# Patient Record
Sex: Female | Born: 1935 | Race: White | Hispanic: No | Marital: Married | Smoking: Never smoker
Health system: Southern US, Community
[De-identification: ages and names within clinical notes are randomized; demographics above are authoritative.]

## PROBLEM LIST (undated history)

## (undated) DIAGNOSIS — K644 Residual hemorrhoidal skin tags: Secondary | ICD-10-CM

## (undated) DIAGNOSIS — M810 Age-related osteoporosis without current pathological fracture: Secondary | ICD-10-CM

## (undated) DIAGNOSIS — K5900 Constipation, unspecified: Secondary | ICD-10-CM

## (undated) DIAGNOSIS — H9190 Unspecified hearing loss, unspecified ear: Secondary | ICD-10-CM

## (undated) DIAGNOSIS — R030 Elevated blood-pressure reading, without diagnosis of hypertension: Secondary | ICD-10-CM

## (undated) DIAGNOSIS — K219 Gastro-esophageal reflux disease without esophagitis: Secondary | ICD-10-CM

## (undated) DIAGNOSIS — R609 Edema, unspecified: Secondary | ICD-10-CM

## (undated) DIAGNOSIS — R42 Dizziness and giddiness: Secondary | ICD-10-CM

## (undated) DIAGNOSIS — R413 Other amnesia: Secondary | ICD-10-CM

## (undated) DIAGNOSIS — E782 Mixed hyperlipidemia: Secondary | ICD-10-CM

## (undated) DIAGNOSIS — I83893 Varicose veins of bilateral lower extremities with other complications: Secondary | ICD-10-CM

## (undated) DIAGNOSIS — J309 Allergic rhinitis, unspecified: Secondary | ICD-10-CM

## (undated) DIAGNOSIS — H269 Unspecified cataract: Secondary | ICD-10-CM

## (undated) DIAGNOSIS — L509 Urticaria, unspecified: Secondary | ICD-10-CM

## (undated) DIAGNOSIS — Z860100 Personal history of colon polyps, unspecified: Secondary | ICD-10-CM

## (undated) DIAGNOSIS — L280 Lichen simplex chronicus: Secondary | ICD-10-CM

## (undated) DIAGNOSIS — S139XXA Sprain of joints and ligaments of unspecified parts of neck, initial encounter: Secondary | ICD-10-CM

## (undated) DIAGNOSIS — K5909 Other constipation: Secondary | ICD-10-CM

## (undated) DIAGNOSIS — I10 Essential (primary) hypertension: Secondary | ICD-10-CM

## (undated) DIAGNOSIS — C50919 Malignant neoplasm of unspecified site of unspecified female breast: Secondary | ICD-10-CM

## (undated) HISTORY — DX: Edema, unspecified: R60.9

## (undated) HISTORY — DX: Sprain of joints and ligaments of unspecified parts of neck, initial encounter: S13.9XXA

## (undated) HISTORY — DX: Other amnesia: R41.3

## (undated) HISTORY — DX: Mixed hyperlipidemia: E78.2

## (undated) HISTORY — DX: Other constipation: K59.09

## (undated) HISTORY — DX: Allergic rhinitis, unspecified: J30.9

## (undated) HISTORY — DX: Malignant neoplasm of unspecified site of unspecified female breast: C50.919

## (undated) HISTORY — PX: BILATERAL OOPHORECTOMY: SHX1221

## (undated) HISTORY — DX: Urticaria, unspecified: L50.9

## (undated) HISTORY — DX: Essential (primary) hypertension: I10

## (undated) HISTORY — DX: Unspecified cataract: H26.9

## (undated) HISTORY — DX: Gastro-esophageal reflux disease without esophagitis: K21.9

## (undated) HISTORY — DX: Lichen simplex chronicus: L28.0

## (undated) HISTORY — DX: Unspecified hearing loss, unspecified ear: H91.90

## (undated) HISTORY — DX: Varicose veins of bilateral lower extremities with other complications: I83.893

## (undated) HISTORY — DX: Elevated blood-pressure reading, without diagnosis of hypertension: R03.0

## (undated) HISTORY — DX: Personal history of colon polyps, unspecified: Z86.0100

## (undated) HISTORY — PX: OTHER SURGICAL HISTORY: SHX169

## (undated) HISTORY — DX: Dizziness and giddiness: R42

## (undated) HISTORY — DX: Residual hemorrhoidal skin tags: K64.4

## (undated) HISTORY — DX: Constipation, unspecified: K59.00

## (undated) HISTORY — DX: Age-related osteoporosis without current pathological fracture: M81.0

## (undated) HISTORY — DX: Personal history of colonic polyps: Z86.010

---

## 1985-11-07 HISTORY — PX: PARTIAL HYSTERECTOMY: SHX80

## 1998-04-28 ENCOUNTER — Other Ambulatory Visit: Admission: RE | Admit: 1998-04-28 | Discharge: 1998-04-28 | Payer: Self-pay | Admitting: Obstetrics and Gynecology

## 1998-07-29 ENCOUNTER — Ambulatory Visit (HOSPITAL_COMMUNITY): Admission: RE | Admit: 1998-07-29 | Discharge: 1998-07-29 | Payer: Self-pay | Admitting: Obstetrics and Gynecology

## 1999-08-04 ENCOUNTER — Encounter: Payer: Self-pay | Admitting: Family Medicine

## 1999-08-04 ENCOUNTER — Ambulatory Visit (HOSPITAL_COMMUNITY): Admission: RE | Admit: 1999-08-04 | Discharge: 1999-08-04 | Payer: Self-pay | Admitting: Obstetrics and Gynecology

## 1999-11-08 HISTORY — PX: OTHER SURGICAL HISTORY: SHX169

## 2000-08-04 ENCOUNTER — Ambulatory Visit (HOSPITAL_COMMUNITY): Admission: RE | Admit: 2000-08-04 | Discharge: 2000-08-04 | Payer: Self-pay | Admitting: Family Medicine

## 2000-08-04 ENCOUNTER — Encounter: Payer: Self-pay | Admitting: Obstetrics and Gynecology

## 2000-08-14 ENCOUNTER — Other Ambulatory Visit: Admission: RE | Admit: 2000-08-14 | Discharge: 2000-08-14 | Payer: Self-pay | Admitting: Obstetrics and Gynecology

## 2000-08-22 ENCOUNTER — Other Ambulatory Visit: Admission: RE | Admit: 2000-08-22 | Discharge: 2000-08-22 | Payer: Self-pay | Admitting: Obstetrics and Gynecology

## 2000-08-25 ENCOUNTER — Encounter: Payer: Self-pay | Admitting: Obstetrics and Gynecology

## 2000-08-25 ENCOUNTER — Encounter: Admission: RE | Admit: 2000-08-25 | Discharge: 2000-08-25 | Payer: Self-pay | Admitting: Obstetrics and Gynecology

## 2001-08-22 ENCOUNTER — Encounter: Payer: Self-pay | Admitting: Family Medicine

## 2001-08-22 ENCOUNTER — Encounter: Admission: RE | Admit: 2001-08-22 | Discharge: 2001-08-22 | Payer: Self-pay | Admitting: Family Medicine

## 2001-09-07 HISTORY — PX: COLONOSCOPY: SHX174

## 2002-08-23 ENCOUNTER — Other Ambulatory Visit: Admission: RE | Admit: 2002-08-23 | Discharge: 2002-08-23 | Payer: Self-pay | Admitting: Gynecology

## 2002-08-26 ENCOUNTER — Encounter: Payer: Self-pay | Admitting: Family Medicine

## 2002-08-26 ENCOUNTER — Encounter: Admission: RE | Admit: 2002-08-26 | Discharge: 2002-08-26 | Payer: Self-pay | Admitting: Family Medicine

## 2003-04-08 HISTORY — PX: CATARACT EXTRACTION: SUR2

## 2003-08-28 ENCOUNTER — Encounter: Admission: RE | Admit: 2003-08-28 | Discharge: 2003-08-28 | Payer: Self-pay | Admitting: Gynecology

## 2003-08-28 ENCOUNTER — Encounter: Payer: Self-pay | Admitting: Gynecology

## 2003-09-08 HISTORY — PX: OTHER SURGICAL HISTORY: SHX169

## 2003-09-22 ENCOUNTER — Encounter: Admission: RE | Admit: 2003-09-22 | Discharge: 2003-09-22 | Payer: Self-pay | Admitting: Gynecology

## 2003-09-24 ENCOUNTER — Inpatient Hospital Stay (HOSPITAL_COMMUNITY): Admission: RE | Admit: 2003-09-24 | Discharge: 2003-09-26 | Payer: Self-pay | Admitting: Gynecology

## 2003-09-24 ENCOUNTER — Encounter (INDEPENDENT_AMBULATORY_CARE_PROVIDER_SITE_OTHER): Payer: Self-pay | Admitting: Specialist

## 2004-08-25 ENCOUNTER — Other Ambulatory Visit: Admission: RE | Admit: 2004-08-25 | Discharge: 2004-08-25 | Payer: Self-pay | Admitting: Obstetrics and Gynecology

## 2004-09-07 HISTORY — PX: COLONOSCOPY: SHX174

## 2004-09-17 ENCOUNTER — Ambulatory Visit: Payer: Self-pay | Admitting: Internal Medicine

## 2004-10-04 ENCOUNTER — Ambulatory Visit: Payer: Self-pay | Admitting: Gastroenterology

## 2004-11-07 HISTORY — PX: OTHER SURGICAL HISTORY: SHX169

## 2005-01-24 ENCOUNTER — Ambulatory Visit: Payer: Self-pay | Admitting: Internal Medicine

## 2005-02-01 ENCOUNTER — Ambulatory Visit: Payer: Self-pay | Admitting: Family Medicine

## 2005-04-15 ENCOUNTER — Ambulatory Visit: Payer: Self-pay | Admitting: Internal Medicine

## 2005-05-23 ENCOUNTER — Ambulatory Visit: Payer: Self-pay | Admitting: Internal Medicine

## 2005-06-17 ENCOUNTER — Ambulatory Visit: Payer: Self-pay | Admitting: Family Medicine

## 2005-08-31 ENCOUNTER — Ambulatory Visit: Payer: Self-pay | Admitting: Family Medicine

## 2005-11-29 ENCOUNTER — Ambulatory Visit: Payer: Self-pay | Admitting: Family Medicine

## 2006-01-18 ENCOUNTER — Ambulatory Visit: Payer: Self-pay | Admitting: Family Medicine

## 2006-03-08 ENCOUNTER — Encounter: Payer: Self-pay | Admitting: Obstetrics and Gynecology

## 2006-08-28 ENCOUNTER — Ambulatory Visit: Payer: Self-pay | Admitting: Family Medicine

## 2006-10-02 ENCOUNTER — Ambulatory Visit: Payer: Self-pay | Admitting: Family Medicine

## 2006-10-07 HISTORY — PX: BREAST BIOPSY: SHX20

## 2006-11-07 HISTORY — PX: BREAST LUMPECTOMY: SHX2

## 2006-12-07 ENCOUNTER — Encounter: Admission: RE | Admit: 2006-12-07 | Discharge: 2006-12-07 | Payer: Self-pay | Admitting: General Surgery

## 2006-12-13 ENCOUNTER — Encounter: Admission: RE | Admit: 2006-12-13 | Discharge: 2006-12-13 | Payer: Self-pay | Admitting: General Surgery

## 2006-12-13 ENCOUNTER — Ambulatory Visit (HOSPITAL_BASED_OUTPATIENT_CLINIC_OR_DEPARTMENT_OTHER): Admission: RE | Admit: 2006-12-13 | Discharge: 2006-12-13 | Payer: Self-pay | Admitting: General Surgery

## 2006-12-13 ENCOUNTER — Encounter (INDEPENDENT_AMBULATORY_CARE_PROVIDER_SITE_OTHER): Payer: Self-pay | Admitting: Specialist

## 2007-03-19 ENCOUNTER — Telehealth (INDEPENDENT_AMBULATORY_CARE_PROVIDER_SITE_OTHER): Payer: Self-pay | Admitting: *Deleted

## 2007-04-27 ENCOUNTER — Ambulatory Visit: Payer: Self-pay | Admitting: Cardiology

## 2007-04-27 ENCOUNTER — Ambulatory Visit: Payer: Self-pay | Admitting: Internal Medicine

## 2007-04-27 ENCOUNTER — Observation Stay (HOSPITAL_COMMUNITY): Admission: EM | Admit: 2007-04-27 | Discharge: 2007-04-28 | Payer: Self-pay | Admitting: Emergency Medicine

## 2007-04-27 ENCOUNTER — Encounter: Payer: Self-pay | Admitting: Cardiology

## 2007-05-02 ENCOUNTER — Telehealth (INDEPENDENT_AMBULATORY_CARE_PROVIDER_SITE_OTHER): Payer: Self-pay | Admitting: Internal Medicine

## 2007-05-08 HISTORY — PX: OTHER SURGICAL HISTORY: SHX169

## 2007-05-15 ENCOUNTER — Ambulatory Visit: Payer: Self-pay

## 2007-05-15 ENCOUNTER — Ambulatory Visit: Payer: Self-pay | Admitting: Cardiology

## 2007-05-15 LAB — CONVERTED CEMR LAB
Cholesterol: 199 mg/dL (ref 0–200)
HDL: 50.4 mg/dL (ref >39.0)
LDL Cholesterol: 126 mg/dL — ABNORMAL HIGH (ref 0–99)
VLDL: 23 mg/dL (ref 0–40)

## 2007-05-18 ENCOUNTER — Ambulatory Visit: Payer: Self-pay | Admitting: Cardiology

## 2007-07-06 ENCOUNTER — Telehealth (INDEPENDENT_AMBULATORY_CARE_PROVIDER_SITE_OTHER): Payer: Self-pay | Admitting: *Deleted

## 2007-08-06 DIAGNOSIS — M810 Age-related osteoporosis without current pathological fracture: Secondary | ICD-10-CM

## 2007-08-06 DIAGNOSIS — J309 Allergic rhinitis, unspecified: Secondary | ICD-10-CM | POA: Insufficient documentation

## 2007-08-06 DIAGNOSIS — L509 Urticaria, unspecified: Secondary | ICD-10-CM

## 2007-08-06 DIAGNOSIS — L280 Lichen simplex chronicus: Secondary | ICD-10-CM | POA: Insufficient documentation

## 2007-08-30 ENCOUNTER — Ambulatory Visit: Payer: Self-pay | Admitting: Family Medicine

## 2007-08-30 DIAGNOSIS — E782 Mixed hyperlipidemia: Secondary | ICD-10-CM

## 2007-08-30 DIAGNOSIS — Z860100 Personal history of colon polyps, unspecified: Secondary | ICD-10-CM | POA: Insufficient documentation

## 2007-08-31 LAB — CONVERTED CEMR LAB
BUN: 7 mg/dL (ref 6–23)
Creatinine, Ser: 0.5 mg/dL (ref 0.4–1.2)
Glucose, Bld: 100 mg/dL — ABNORMAL HIGH (ref 70–99)
HDL: 65.2 mg/dL (ref >39.0)
Sodium: 141 meq/L (ref 135–145)
Total CHOL/HDL Ratio: 3.6

## 2007-09-08 HISTORY — PX: COLONOSCOPY: SHX174

## 2007-09-25 ENCOUNTER — Ambulatory Visit: Payer: Self-pay | Admitting: Gastroenterology

## 2007-10-01 ENCOUNTER — Encounter: Payer: Self-pay | Admitting: Family Medicine

## 2007-10-01 ENCOUNTER — Encounter: Admission: RE | Admit: 2007-10-01 | Discharge: 2007-10-01 | Payer: Self-pay | Admitting: Surgery

## 2007-10-09 ENCOUNTER — Encounter: Payer: Self-pay | Admitting: Family Medicine

## 2007-10-09 ENCOUNTER — Encounter: Payer: Self-pay | Admitting: Gastroenterology

## 2007-10-09 ENCOUNTER — Ambulatory Visit: Payer: Self-pay | Admitting: Gastroenterology

## 2007-11-07 ENCOUNTER — Ambulatory Visit: Payer: Self-pay | Admitting: Family Medicine

## 2007-11-21 ENCOUNTER — Telehealth: Payer: Self-pay | Admitting: Family Medicine

## 2007-12-09 HISTORY — PX: OTHER SURGICAL HISTORY: SHX169

## 2008-04-21 ENCOUNTER — Telehealth: Payer: Self-pay | Admitting: Family Medicine

## 2008-04-21 DIAGNOSIS — K5909 Other constipation: Secondary | ICD-10-CM | POA: Insufficient documentation

## 2008-04-30 ENCOUNTER — Encounter: Payer: Self-pay | Admitting: Family Medicine

## 2008-05-29 ENCOUNTER — Ambulatory Visit: Payer: Self-pay | Admitting: Family Medicine

## 2008-08-07 LAB — CONVERTED CEMR LAB: Pap Smear: NORMAL

## 2008-09-02 ENCOUNTER — Ambulatory Visit: Payer: Self-pay | Admitting: Family Medicine

## 2008-09-02 DIAGNOSIS — H9190 Unspecified hearing loss, unspecified ear: Secondary | ICD-10-CM | POA: Insufficient documentation

## 2008-09-08 LAB — CONVERTED CEMR LAB
ALT: 21 units/L (ref 0–35)
Albumin: 3.8 g/dL (ref 3.5–5.2)
Alkaline Phosphatase: 110 units/L (ref 39–117)
Basophils Absolute: 0 10*3/uL (ref 0.0–0.1)
Bilirubin, Direct: 0.1 mg/dL (ref 0.0–0.3)
Cholesterol: 198 mg/dL (ref 0–200)
Eosinophils Absolute: 0.1 10*3/uL (ref 0.0–0.7)
Eosinophils Relative: 2 % (ref 0.0–5.0)
GFR calc Af Amer: 105 mL/min
GFR calc non Af Amer: 87 mL/min
Hemoglobin: 14.7 g/dL (ref 12.0–15.0)
MCHC: 33.7 g/dL (ref 30.0–36.0)
Monocytes Absolute: 0.3 10*3/uL (ref 0.1–1.0)
Monocytes Relative: 4 % (ref 3.0–12.0)
Neutro Abs: 5 10*3/uL (ref 1.4–7.7)
Platelets: 331 10*3/uL (ref 150–400)
Potassium: 4.3 meq/L (ref 3.5–5.1)
RBC: 4.65 M/uL (ref 3.87–5.11)
RDW: 12.5 % (ref 11.5–14.6)
Triglycerides: 93 mg/dL (ref 0–149)
VLDL: 19 mg/dL (ref 0–40)
Vit D, 1,25-Dihydroxy: 28 — ABNORMAL LOW (ref 30–89)

## 2008-10-10 ENCOUNTER — Encounter: Admission: RE | Admit: 2008-10-10 | Discharge: 2008-10-10 | Payer: Self-pay | Admitting: Obstetrics

## 2008-10-10 ENCOUNTER — Encounter: Payer: Self-pay | Admitting: Family Medicine

## 2008-10-16 ENCOUNTER — Encounter (INDEPENDENT_AMBULATORY_CARE_PROVIDER_SITE_OTHER): Payer: Self-pay | Admitting: *Deleted

## 2008-10-16 ENCOUNTER — Ambulatory Visit: Payer: Self-pay | Admitting: Family Medicine

## 2008-10-25 ENCOUNTER — Telehealth: Payer: Self-pay | Admitting: Family Medicine

## 2008-10-28 ENCOUNTER — Encounter: Payer: Self-pay | Admitting: Family Medicine

## 2008-11-13 ENCOUNTER — Ambulatory Visit: Payer: Self-pay | Admitting: Family Medicine

## 2009-01-21 ENCOUNTER — Ambulatory Visit: Payer: Self-pay | Admitting: Family Medicine

## 2009-01-22 ENCOUNTER — Ambulatory Visit: Payer: Self-pay | Admitting: Family Medicine

## 2009-01-22 DIAGNOSIS — I10 Essential (primary) hypertension: Secondary | ICD-10-CM | POA: Insufficient documentation

## 2009-01-27 ENCOUNTER — Ambulatory Visit: Payer: Self-pay | Admitting: Family Medicine

## 2009-03-05 ENCOUNTER — Ambulatory Visit: Payer: Self-pay | Admitting: Family Medicine

## 2009-04-03 ENCOUNTER — Ambulatory Visit: Payer: Self-pay | Admitting: Family Medicine

## 2009-04-03 DIAGNOSIS — R609 Edema, unspecified: Secondary | ICD-10-CM

## 2009-04-15 ENCOUNTER — Ambulatory Visit: Payer: Self-pay | Admitting: Family Medicine

## 2009-04-17 LAB — CONVERTED CEMR LAB
BUN: 10 mg/dL (ref 6–23)
CO2: 33 meq/L — ABNORMAL HIGH (ref 19–32)
Calcium: 10.6 mg/dL — ABNORMAL HIGH (ref 8.4–10.5)
Chloride: 93 meq/L — ABNORMAL LOW (ref 96–112)
Phosphorus: 3.4 mg/dL (ref 2.3–4.6)
Potassium: 3.7 meq/L (ref 3.5–5.1)
Sodium: 130 meq/L — ABNORMAL LOW (ref 135–145)

## 2009-04-23 ENCOUNTER — Encounter (INDEPENDENT_AMBULATORY_CARE_PROVIDER_SITE_OTHER): Payer: Self-pay | Admitting: *Deleted

## 2009-04-29 ENCOUNTER — Ambulatory Visit: Payer: Self-pay | Admitting: Family Medicine

## 2009-04-30 LAB — CONVERTED CEMR LAB
BUN: 11 mg/dL (ref 6–23)
Chloride: 97 meq/L (ref 96–112)

## 2009-05-04 ENCOUNTER — Ambulatory Visit: Payer: Self-pay | Admitting: Family Medicine

## 2009-07-23 ENCOUNTER — Ambulatory Visit: Payer: Self-pay | Admitting: Family Medicine

## 2009-07-23 DIAGNOSIS — F0390 Unspecified dementia without behavioral disturbance: Secondary | ICD-10-CM

## 2009-07-28 LAB — CONVERTED CEMR LAB
ALT: 23 units/L (ref 0–35)
AST: 31 units/L (ref 0–37)
Albumin: 4 g/dL (ref 3.5–5.2)
Alkaline Phosphatase: 86 units/L (ref 39–117)
Basophils Relative: 1.4 % (ref 0.0–3.0)
Bilirubin, Direct: 0.1 mg/dL (ref 0.0–0.3)
Cholesterol: 190 mg/dL (ref 0–200)
Creatinine, Ser: 0.5 mg/dL (ref 0.4–1.2)
Eosinophils Absolute: 0.1 10*3/uL (ref 0.0–0.7)
Eosinophils Relative: 1.3 % (ref 0.0–5.0)
Folate: 14 ng/mL
Glucose, Bld: 105 mg/dL — ABNORMAL HIGH (ref 70–99)
HCT: 41.9 % (ref 36.0–46.0)
Monocytes Relative: 11.2 % (ref 3.0–12.0)
Platelets: 341 10*3/uL (ref 150.0–400.0)
RBC: 4.45 M/uL (ref 3.87–5.11)
RDW: 12.3 % (ref 11.5–14.6)
Sodium: 129 meq/L — ABNORMAL LOW (ref 135–145)
VLDL: 22.6 mg/dL (ref 0.0–40.0)
Vitamin B-12: 581 pg/mL (ref 211–911)

## 2009-08-05 ENCOUNTER — Ambulatory Visit: Payer: Self-pay | Admitting: Family Medicine

## 2009-08-07 LAB — CONVERTED CEMR LAB: Sodium: 132 meq/L — ABNORMAL LOW (ref 135–145)

## 2009-08-28 ENCOUNTER — Ambulatory Visit: Payer: Self-pay | Admitting: Family Medicine

## 2009-09-16 ENCOUNTER — Telehealth: Payer: Self-pay | Admitting: Family Medicine

## 2009-09-18 ENCOUNTER — Ambulatory Visit: Payer: Self-pay | Admitting: Family Medicine

## 2009-09-28 ENCOUNTER — Ambulatory Visit: Payer: Self-pay | Admitting: Family Medicine

## 2009-10-12 ENCOUNTER — Encounter: Admission: RE | Admit: 2009-10-12 | Discharge: 2009-10-12 | Payer: Self-pay | Admitting: Surgery

## 2009-10-29 ENCOUNTER — Encounter: Payer: Self-pay | Admitting: Family Medicine

## 2009-11-10 ENCOUNTER — Telehealth: Payer: Self-pay | Admitting: Family Medicine

## 2009-12-28 ENCOUNTER — Encounter: Payer: Self-pay | Admitting: Family Medicine

## 2010-01-04 ENCOUNTER — Telehealth: Payer: Self-pay | Admitting: Family Medicine

## 2010-01-04 ENCOUNTER — Encounter: Payer: Self-pay | Admitting: Family Medicine

## 2010-01-06 ENCOUNTER — Ambulatory Visit: Payer: Self-pay | Admitting: Family Medicine

## 2010-01-06 DIAGNOSIS — K644 Residual hemorrhoidal skin tags: Secondary | ICD-10-CM | POA: Insufficient documentation

## 2010-01-06 DIAGNOSIS — K5900 Constipation, unspecified: Secondary | ICD-10-CM | POA: Insufficient documentation

## 2010-04-16 ENCOUNTER — Ambulatory Visit: Payer: Self-pay | Admitting: Family Medicine

## 2010-04-21 ENCOUNTER — Encounter: Payer: Self-pay | Admitting: Family Medicine

## 2010-05-05 ENCOUNTER — Ambulatory Visit: Payer: Self-pay | Admitting: Family Medicine

## 2010-05-05 DIAGNOSIS — I83893 Varicose veins of bilateral lower extremities with other complications: Secondary | ICD-10-CM | POA: Insufficient documentation

## 2010-05-13 ENCOUNTER — Telehealth: Payer: Self-pay | Admitting: Internal Medicine

## 2010-06-04 ENCOUNTER — Ambulatory Visit: Payer: Self-pay | Admitting: Family Medicine

## 2010-06-07 ENCOUNTER — Encounter: Admission: RE | Admit: 2010-06-07 | Discharge: 2010-06-07 | Payer: Self-pay | Admitting: Family Medicine

## 2010-06-07 LAB — CONVERTED CEMR LAB
BUN: 9 mg/dL (ref 6–23)
CO2: 26 meq/L (ref 19–32)
Glucose, Bld: 99 mg/dL (ref 70–99)
Potassium: 4.5 meq/L (ref 3.5–5.3)

## 2010-09-13 ENCOUNTER — Telehealth: Payer: Self-pay | Admitting: Family Medicine

## 2010-09-13 ENCOUNTER — Encounter: Payer: Self-pay | Admitting: Family Medicine

## 2010-09-24 ENCOUNTER — Ambulatory Visit: Payer: Self-pay | Admitting: Family Medicine

## 2010-09-24 DIAGNOSIS — R42 Dizziness and giddiness: Secondary | ICD-10-CM

## 2010-10-12 ENCOUNTER — Telehealth: Payer: Self-pay | Admitting: Family Medicine

## 2010-10-13 ENCOUNTER — Encounter
Admission: RE | Admit: 2010-10-13 | Discharge: 2010-10-13 | Payer: Self-pay | Source: Home / Self Care | Admitting: Surgery

## 2010-10-13 LAB — HM MAMMOGRAPHY: HM Mammogram: NORMAL

## 2010-10-14 ENCOUNTER — Telehealth: Payer: Self-pay | Admitting: Family Medicine

## 2010-10-15 ENCOUNTER — Telehealth: Payer: Self-pay | Admitting: Family Medicine

## 2010-10-15 ENCOUNTER — Ambulatory Visit: Payer: Self-pay | Admitting: Family Medicine

## 2010-10-17 LAB — CONVERTED CEMR LAB
BUN: 13 mg/dL (ref 6–23)
Calcium: 10.6 mg/dL — ABNORMAL HIGH (ref 8.4–10.5)
Eosinophils Absolute: 0.2 10*3/uL (ref 0.0–0.7)
Eosinophils Relative: 2.3 % (ref 0.0–5.0)
GFR calc non Af Amer: 109.85 mL/min (ref >60.00)
Glucose, Bld: 139 mg/dL — ABNORMAL HIGH (ref 70–99)
HCT: 42.5 % (ref 36.0–46.0)
Lymphocytes Relative: 13.7 % (ref 12.0–46.0)
MCHC: 34.5 g/dL (ref 30.0–36.0)
Monocytes Relative: 9.1 % (ref 3.0–12.0)
Neutro Abs: 6 10*3/uL (ref 1.4–7.7)
Potassium: 4.5 meq/L (ref 3.5–5.1)
RBC: 4.58 M/uL (ref 3.87–5.11)
Sodium: 136 meq/L (ref 135–145)

## 2010-10-18 ENCOUNTER — Encounter (INDEPENDENT_AMBULATORY_CARE_PROVIDER_SITE_OTHER): Payer: Self-pay | Admitting: *Deleted

## 2010-10-18 ENCOUNTER — Encounter: Payer: Self-pay | Admitting: Family Medicine

## 2010-10-19 ENCOUNTER — Encounter: Payer: Self-pay | Admitting: Family Medicine

## 2010-11-05 ENCOUNTER — Telehealth: Payer: Self-pay | Admitting: Family Medicine

## 2010-11-06 ENCOUNTER — Ambulatory Visit
Admission: RE | Admit: 2010-11-06 | Discharge: 2010-11-06 | Payer: Self-pay | Source: Home / Self Care | Attending: Internal Medicine | Admitting: Internal Medicine

## 2010-11-06 ENCOUNTER — Encounter (INDEPENDENT_AMBULATORY_CARE_PROVIDER_SITE_OTHER): Payer: Self-pay | Admitting: Internal Medicine

## 2010-11-06 DIAGNOSIS — S139XXA Sprain of joints and ligaments of unspecified parts of neck, initial encounter: Secondary | ICD-10-CM | POA: Insufficient documentation

## 2010-11-09 ENCOUNTER — Encounter
Admission: RE | Admit: 2010-11-09 | Discharge: 2010-11-09 | Payer: Self-pay | Source: Home / Self Care | Attending: Internal Medicine | Admitting: Internal Medicine

## 2010-11-11 ENCOUNTER — Telehealth (INDEPENDENT_AMBULATORY_CARE_PROVIDER_SITE_OTHER): Payer: Self-pay | Admitting: Internal Medicine

## 2010-11-12 ENCOUNTER — Encounter: Payer: Self-pay | Admitting: Family Medicine

## 2010-11-28 ENCOUNTER — Encounter: Payer: Self-pay | Admitting: Radiology

## 2010-12-09 NOTE — Assessment & Plan Note (Signed)
Summary: DIZZY ALMOST PASSED OUT/[facility]   Vital Signs:  Patient profile:   77 year old female Height:      59 inches Temp:     97.7 degrees F oral Pulse rate:   72 / minute Pulse rhythm:   regular BP sitting:   138 / 80  (left arm) Cuff size:   regular  Vitals Entered By: Lewanda Rife LPN (September 24, 2010 4:37 PM) CC: Felt dizzy this morning and lasted for 5- 10 mins. felt like if she did not sit down she might pass out. Pt did not pass out and has not been dizzy anymore today.   History of Present Illness: the last week or so gets dizzy for a second or two -- and then it gets better   this am had episode while standing -- felt like the room was spinning  lasted about 10 minutes  got to the couch and did not fall  felt almost like she was going to fall  was worse to turn head or move eyes   does have some allergy symptoms   after that was just fine - it went away quickly   ? if vertigo  ? thinks she had it a long time ago   no difficulty speaking or numbness/weakness no other symptoms at all  feels a little tired now   forgot to fill her amlodipine - has not been taking it        Allergies: 1)  ! Pcn 2)  ! Codeine 3)  ! Hydrocodone 4)  ! Biaxin 5)  ! Keflex 6)  ! * Actonel 7)  ! Fosamax 8)  ! * Hctz  Past History:  Past Medical History: Last updated: 09/28/2009 GERD hyperlipedema                                                                                                                                                         breast CA --right Allergic rhinitis Osteoporosis HTN 3/10 cataracts- complications from eye surgery memory loss/ early dementia  Past Surgical History: Last updated: 10/16/2008 hosp 6/08 for chest pain, ruled out for MI stress perfusion cardiac study 7/08 nl with nl EF Dexa- osteopenia (2001) Hysterectomy- partial, fibroids (1987) Colonoscopy- polyp (09/2001) Dexa- improved OP  Cataract extraction (04/2003) Surgery-  benign ovarian tumor (11/04) Oophorectomy- bilateral  Colonoscopy- polyps (09/2004) Dexa- OP (01/06) 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) Hosp- chest pain- r/o MI (04/2007) stress nuclear study neg 7/08 colonoscopy adenomatous polyps 11/08 dexa 12/09 OP T score -3.0 spine and -2.5 in fem neck  gyn-- Dr Emelda Fear   Family History: Last updated: 08/28/2009 Father: brain cancer Mother: renal cell ca Siblings: 2 brothers with DM brother died at 74 of colon ca has an uncle who is healthy at age of 47  aunt - alzheimers   Social History: Last updated: 05/29/2008 Marital Status: Married Children:  Occupation:  non smoker no alcohol   Risk Factors: Smoking Status: never (04/27/2007)  Review of Systems General:  Denies chills, fatigue, fever, loss of appetite, and malaise. Eyes:  Denies blurring and eye irritation. CV:  Denies chest pain or discomfort, lightheadness, and palpitations. Resp:  Denies cough, shortness of breath, and wheezing. GI:  Denies abdominal pain, change in bowel habits, and indigestion. MS:  Complains of stiffness. Derm:  Denies itching, lesion(s), poor wound healing, and rash. Neuro:  Complains of memory loss and sensation of room spinning; denies difficulty with concentration, disturbances in coordination, headaches, numbness, tingling, visual disturbances, and weakness. Psych:  Denies anxiety and depression. Endo:  Denies excessive thirst and excessive urination. Heme:  Denies abnormal bruising and bleeding.  Physical Exam  General:  slim/ frail appearing elderly female Head:  normocephalic, atraumatic, and no abnormalities observed.  no sinus or temporal tenderness Eyes:  vision grossly intact, pupils equal, pupils round, and pupils reactive to light.  fundi grossly wnl  2-3 beats of horiz nystagmus bilat  Ears:  TMs clear with some scarring Nose:  nares boggy and congested Mouth:  pharynx pink and moist, no erythema, and no  exudates.   Neck:  supple with full rom and no masses or thyromegally, no JVD or carotid bruit  Lungs:  Normal respiratory effort, chest expands symmetrically. Lungs are clear to auscultation, no crackles or wheezes. Heart:  Normal rate and regular rhythm. S1 and S2 normal without gallop, murmur, click, rub or other extra sounds. Abdomen:  soft and non-tender.   Extremities:  1 plus pedal edema with pitting  2 by 3 cm mass behind L knee that is very soft and mobile- resembling lipoma  varicosities noted bilat Neurologic:  cranial nerves II-XII intact, strength normal in all extremities, sensation intact to light touch, gait normal, DTRs symmetrical and normal, and Romberg negative.   Skin:  Intact without suspicious lesions or rashes Cervical Nodes:  No lymphadenopathy noted Psych:  normal affect, talkative and pleasant    Impression & Recommendations:  Problem # 1:  VERTIGO (ICD-780.4) Assessment New  with some congestion and allergy symptoms  given px for meclizine to use daily for 3 d and then as needed  disc other s/s --neurol symptoms to watch for such as speech diff/ numb/weaknes or headache  disc using care when changing position of head The following medications were removed from the medication list:    Claritin Tabs (Loratadine tabs) .Marland Kitchen... As needed Her updated medication list for this problem includes:    Meclizine Hcl 25 Mg Tabs (Meclizine hcl) .Marland Kitchen... 1 by mouth three times a day as needed dizziness  Orders: Prescription Created Electronically [phone])  Problem # 2:  ALLERGIC RHINITIS (ICD-477.9) Assessment: Deteriorated  disc tx  urged to re start flonase for rhinorrhea and congestion  The following medications were removed from the medication list:    Claritin Tabs (Loratadine tabs) .Marland Kitchen... As needed Her updated medication list for this problem includes:    Flonase 50 Mcg/act Susp (Fluticasone propionate) .Marland Kitchen... 2 sprays in each nostril daily as  needed  Orders: Prescription Created Electronically [phone])  Complete Medication List: 1)  Flonase 50 Mcg/act Susp (Fluticasone propionate) .... 2 sprays in each nostril daily as needed 2)  Caltrate 600+d Tabs (Calcium carbonate-vitamin d tabs) .... Take 1 tablet by mouth two times a day 3)  Clobetasol Propionate 0.05 % Crea (Clobetasol propionate) .Marland KitchenMarland KitchenMarland Kitchen  Apply two times a week as needed 4)  Fosamax 70 Mg Tabs (Alendronate sodium) .Marland Kitchen.. 1 by mouth q week as directed 5)  Amlodipine Besylate 5 Mg Tabs (Amlodipine besylate) .Marland Kitchen.. 1 tab by mouth daily on hold nov 11 6)  Vitamin D 1000 Unit Tabs (Cholecalciferol) .... Take 1 tablet by mouth once a day 7)  Aricept 5 Mg Tabs (Donepezil hcl) .Marland Kitchen.. 1 by mouth once daily 8)  Support Hose To The Thigh 15 Mm Hg  .... Wear once daily for pain and swelling from varicose veins 454.8 9)  Meclizine Hcl 25 Mg Tabs (Meclizine hcl) .Marland Kitchen.. 1 by mouth three times a day as needed dizziness  Patient Instructions: 1)  get back on flonase nasal spray every day for allergies -that may help the inner ear as well 2)  I think your dizziness is from vertigo -- a condition of inner ear causing a spinning feeling (that can be caused by virus or allergy congestion) 3)  take meclizine three times a day for 3 days - then just use as needed for dizziness  4)  if not imrpoving next week- update me 5)  if dizziness gets severe at any time go to the emergency room  Prescriptions: MECLIZINE HCL 25 MG TABS (MECLIZINE HCL) 1 by mouth three times a day as needed dizziness  #30 x 0   Entered and Authorized by:   Judith Part MD   Signed by:   Judith Part MD on 09/24/2010   Method used:   Electronically to        [facility]  [address] (retail)       [address]       [place], [place]  [address]       Ph: [phone] or [phone]       Fax: [phone]   RxID:   [id number] FLONASE 50 MCG/ACT  SUSP (FLUTICASONE PROPIONATE) 2 sprays in each nostril daily as needed   #1 mdi x 11   Entered and Authorized by:   Judith Part MD   Signed by:   Judith Part MD on 09/24/2010   Method used:   [facility] to        [facility]  [address] (retail)       [address]       [place], [place]  [address]       Ph: [phone] or [phone]       Fax: [phone]   RxID:   [id number]    Orders Added: 1)  Prescription Created Electronically [G8553] 2)  Est. Patient Level IV [43154]    Current Allergies (reviewed today): ! PCN ! CODEINE ! HYDROCODONE ! BIAXIN ! KEFLEX ! * ACTONEL ! FOSAMAX ! * HCTZ

## 2010-12-09 NOTE — Letter (Signed)
Summary: [facility]   Imported By: Lanelle Bal 01/07/2010 12:06:06  _____________________________________________________________________  External Attachment:    Type:   Image     Comment:   External Document

## 2010-12-09 NOTE — Letter (Signed)
Summary: [facility]   Imported By: Lanelle Bal 11/24/2010 09:45:08  _____________________________________________________________________  External Attachment:    Type:   Image     Comment:   External Document

## 2010-12-09 NOTE — Progress Notes (Signed)
Summary: needs referral   Phone Note Call from Patient Call back at Home Phone [phone]   Caller: Patient Call For: Judith Part MD Summary of Call: Patient was seen at gynocologist and was told she had hemorrhoids. She needs referral to dermatologist.  Initial call taken by: Melody Comas,  January 04, 2010 5:05 PM  Follow-up for Phone Call        can you please call for those gyn notes first before I refer? - let her know I want to review them -- thanks  Follow-up by: Judith Part MD,  January 04, 2010 5:27 PM  Additional Follow-up for Phone Call Additional follow up Details #1::        Pt say Dr. Amy [place] a dermatologist for skin problems and pt asked her to look at her bottom. Pt was told had hemmorhoids and advised to see GYN. Pt uses Dr. Ernestina Penna in [place] as her GYN.Please advise. Lewanda Rife LPN  January 04, 2010 5:41 PM   I can see her for hemorroids -- please f/u with me still send for note,, thanks  Additional Follow-up by: Judith Part MD,  January 04, 2010 7:42 PM    Additional Follow-up for Phone Call Additional follow up Details #2::    Patient notified as instructed by telephone. Pt scheduled appt with Dr Milinda Antis 01/06/10 at 4pm. Spoke with Kendal Hymen at Dr Elvis Coil office and she will fax note to Dr tower.Lewanda Rife LPN  January 06, 7828 11:19 AM

## 2010-12-09 NOTE — Progress Notes (Signed)
Summary: f/u on labs  Phone Note Call from Patient   Caller: Patient Summary of Call: Patient wants to know lab results asap, and if she needs to keep ENT appt on 10/19/10.  Initial call taken by: Mills Koller,  October 15, 2010 2:47 PM  Follow-up for Phone Call        nothing alarming with labs  random sugar is a bit high- avoid sweets and we can disc the next time she is here  keep f/u with ent no labs to cause dizziness Follow-up by: Judith Part MD,  October 15, 2010 5:17 PM  Additional Follow-up for Phone Call Additional follow up Details #1::        Patient notified as instructed by telephone. Lewanda Rife LPN  October 15, 2010 5:19 PM

## 2010-12-09 NOTE — Progress Notes (Signed)
Summary: pt is not taking fosamax  Phone Note From Other Clinic   Caller: Belenda Cruise with [facility]  [phone] Summary of Call: Pt is seeing Dr Ernestina Penna at [facility] and she is asking if you are following her osteoporosis.  Pt has told them that she is no longer taking fosamax.  Please advise. Initial call taken by: Lowella Petties CMA, AAMA,  September 13, 2010 4:08 PM  Follow-up for Phone Call        I am following this  she is supposed to be on it  when I last checked with her in june she said she could no longer afford it -- but was going to check again with her insurance to see if covered  let the gyn office know this or fax a copy of this note please check in with the pt to see if she can afford the fosamax now - if she needs a px  Follow-up by: Judith Part MD,  September 13, 2010 5:22 PM  Additional Follow-up for Phone Call Additional follow up Details #1::        Spoke with pt and she is not takiing the Fosamax now. Pt said she would check with her insurance co. again to see if they cover Fosamax now and if she needs a rx pt will call Dr Milinda Antis back. copy of this note faxed thru EMR to [facility] at Dr Elpidio Eric office.Lewanda Rife LPN  September 14, 2010 8:21 AM

## 2010-12-09 NOTE — Miscellaneous (Signed)
   Clinical Lists Changes  Observations: Added new observation of MAMMO DUE: 10/2011 (10/18/2010 13:43) Added new observation of MAMMOGRAM: normal (10/13/2010 13:43)      Preventive Care Screening  Mammogram:    Date:  10/13/2010    Next Due:  10/2011    Results:  normal

## 2010-12-09 NOTE — Assessment & Plan Note (Signed)
Summary: SWOLLEN LEFT LEG/CLE   Vital Signs:  Patient profile:   77 year old female Height:      59 inches Weight:      104.75 pounds BMI:     21.23 Temp:     97.9 degrees F oral Pulse rate:   76 / minute Pulse rhythm:   regular BP sitting:   142 / 74  (left arm) Cuff size:   regular  Vitals Entered By: Lewanda Rife LPN (May 05, 2010 10:28 AM) CC: swollen left foot and ankle   History of Present Illness: 3 weeks ago something happened to ankle  was standing and sharp pain behind knee - then could hardly walk  went to orthopedic doctor - and had to wear ace bandage which made her leg swell   was told a varicose vein ruptured -- this is better now  knee is still a little swollen  knot is still there behind knee - but better   now her foot and ankle are swollen - from ace bandage?  is swelling every day-- is improved now  no testing done on leg  no pain in foot or ankle    Allergies: 1)  ! Pcn 2)  ! Codeine 3)  ! Hydrocodone 4)  ! Biaxin 5)  ! Keflex 6)  ! * Actonel 7)  ! Fosamax 8)  ! * Hctz  Past History:  Past Medical History: Last updated: 09/28/2009 GERD hyperlipedema                                                                                                                                                         breast CA --right Allergic rhinitis Osteoporosis HTN 3/10 cataracts- complications from eye surgery memory loss/ early dementia  Past Surgical History: Last updated: 10/16/2008 hosp 6/08 for chest pain, ruled out for MI stress perfusion cardiac study 7/08 nl with nl EF Dexa- osteopenia (2001) Hysterectomy- partial, fibroids (1987) Colonoscopy- polyp (09/2001) Dexa- improved OP  Cataract extraction (04/2003) Surgery- benign ovarian tumor (11/04) Oophorectomy- bilateral  Colonoscopy- polyps (09/2004) Dexa- OP (01/06) 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) Hosp- chest pain- r/o MI (04/2007) stress nuclear study neg  7/08 colonoscopy adenomatous polyps 11/08 dexa 12/09 OP T score -3.0 spine and -2.5 in fem neck  gyn-- Dr Emelda Fear   Family History: Last updated: 08/28/2009 Father: brain cancer Mother: renal cell ca Siblings: 2 brothers with DM brother died at 84 of colon ca has an uncle who is healthy at age of 29  aunt - alzheimers   Social History: Last updated: 05/29/2008 Marital Status: Married Children:  Occupation:  non smoker no alcohol   Risk Factors: Smoking Status: never (04/27/2007)  Review of Systems General:  Denies fatigue, loss of appetite, and malaise. Eyes:  Denies blurring and eye  pain. CV:  Denies chest pain or discomfort, lightheadness, and palpitations. Resp:  Denies cough, shortness of breath, and wheezing. GI:  Denies abdominal pain, change in bowel habits, and indigestion. GU:  Denies dysuria. MS:  Complains of stiffness; denies joint redness, cramps, and muscle weakness. Derm:  Denies lesion(s), poor wound healing, and rash. Neuro:  Denies numbness, tingling, and weakness. Endo:  Denies heat intolerance. Heme:  Denies abnormal bruising and bleeding.  Physical Exam  General:  elderly - frail app female  Head:  normocephalic, atraumatic, and no abnormalities observed.   Eyes:  vision grossly intact, pupils equal, pupils round, and pupils reactive to light.   Mouth:  pharynx pink and moist.   Neck:  supple with full rom and no masses or thyromegally, no JVD or carotid bruit  Lungs:  Normal respiratory effort, chest expands symmetrically. Lungs are clear to auscultation, no crackles or wheezes. Heart:  Normal rate and regular rhythm. S1 and S2 normal without gallop, murmur, click, rub or other extra sounds. Abdomen:  Bowel sounds positive,abdomen soft and non-tender without masses, organomegaly or hernias noted. Msk:  no acute joint changes  nl rom legs and knees and ankles  Pulses:  R and L carotid,radial,femoral,dorsalis pedis and posterior tibial pulses  are full and equal bilaterally Extremities:  some varicosities of different sizes in lower legs- all compressible  area of swelling in back of L knee - nt - with varicosity noted  1 plus edema of ankle and foot with no tenderness no warmth/ redness/ or palp cords neg homan sign Neurologic:  sensation intact to light touch, gait normal, and DTRs symmetrical and normal.   Skin:  Intact without suspicious lesions or rashes Cervical Nodes:  No lymphadenopathy noted Inguinal Nodes:  No significant adenopathy Psych:  normal affect, talkative and pleasant  she is usually very soft spoken and timid- baseline   Impression & Recommendations:  Problem # 1:  PERIPHERAL EDEMA (ICD-782.3) Assessment Deteriorated this is worse in L foot and ankle after wearing tight ace bandage around knee for 2-3 weeks  I suspect she would benefit from support hose to thigh bilat for varicosities as well as edema  disc poss checking a venous doppler-but pt thinks Dr Leslee Home may have done that or another test  will send for his note before doing any other dx or treatment adv to elevate leg and call back in meantime if symptoms worsen  Problem # 2:  HYPERTENSION, ESSENTIAL NOS (ICD-401.9) Assessment: Unchanged  this is stable currently- no change in med note- hyponatremia in past with hctz  Her updated medication list for this problem includes:    Amlodipine Besylate 5 Mg Tabs (Amlodipine besylate) .Marland Kitchen... 1 tab by mouth daily  BP today: 142/74 Prior BP: 144/60 (04/16/2010)  Labs Reviewed: K+: 3.3 (07/23/2009) Creat: : 0.5 (07/23/2009)   Chol: 190 (07/23/2009)   HDL: 53.80 (07/23/2009)   LDL: 114 (07/23/2009)   TG: 113.0 (07/23/2009)  Problem # 3:  OSTEOPOROSIS (ICD-733.00) Assessment: Unchanged pt is supposed to be on fosamax - ? of affordibility she will call and check on price then update me with whether she needs a px disc imp of OP tx and ca and D- is compliant with those Her updated medication list  for this problem includes:    Caltrate 600+d Tabs (Calcium carbonate-vitamin d tabs) .Marland Kitchen... Take 1 tablet by mouth two times a day    Fosamax 70 Mg Tabs (Alendronate sodium) .Marland Kitchen... 1 by mouth q week as directed  Vitamin D 1000 Unit Tabs (Cholecalciferol) .Marland Kitchen... Take 1 tablet by mouth once a day  Problem # 4:  VARICOSE VEINS LOWER EXTREMITIES W/OTH COMPS (ICD-454.8) Assessment: New per pt has "burst" vein in back of leg  there is area of swelling - that almost feels fatty - less tender today nl rom knees and legs  send for note from ortho  consider supp hose   Complete Medication List: 1)  Claritin Tabs (Loratadine tabs) .... As needed 2)  Flonase 50 Mcg/act Susp (Fluticasone propionate) .... 2 sprays in each nostril daily as needed 3)  Caltrate 600+d Tabs (Calcium carbonate-vitamin d tabs) .... Take 1 tablet by mouth two times a day 4)  Clobetasol Propionate 0.05 % Crea (Clobetasol propionate) .... Apply two times a week as needed 5)  Fosamax 70 Mg Tabs (Alendronate sodium) .Marland Kitchen.. 1 by mouth q week as directed 6)  Amlodipine Besylate 5 Mg Tabs (Amlodipine besylate) .Marland Kitchen.. 1 tab by mouth daily 7)  Vitamin D 1000 Unit Tabs (Cholecalciferol) .... Take 1 tablet by mouth once a day 8)  Aricept 5 Mg Tabs (Donepezil hcl) .Marland Kitchen.. 1 by mouth once daily  Patient Instructions: 1)  please send for last note and any dx studies from Dr Leslee Home recently  2)  elevate your leg whenever you can  3)  after I review your notes - I will likely px you support stockings to the thigh -- that will help leg pain and veins and swelling  4)  if symptoms worsen - let me know  5)  drink plenty of water - and keep your legs elevated whenever you are sitting down 6)  please call your pharmacy to check on price of fosamax and let me know if you can afford the generic - and I will px it for you   Current Allergies (reviewed today): ! PCN ! CODEINE ! HYDROCODONE ! BIAXIN ! KEFLEX ! * ACTONEL ! FOSAMAX ! * HCTZ

## 2010-12-09 NOTE — Letter (Signed)
Summary: [facility]   Imported By: Lanelle Bal 11/09/2009 12:37:56  _____________________________________________________________________  External Attachment:    Type:   Image     Comment:   External Document

## 2010-12-09 NOTE — Progress Notes (Signed)
Summary: going to [facility]   Phone Note Call from Patient Call back at Home Phone [phone]   Caller: Patient Call For: Judith Part MD Summary of Call: Patient called today stating that she fell off of latter 8 or 9 days ago and bumped her head. She says that there is a know on the side of her head that feels really bruised and sore today ( more than it has been), but is having no other symptoms. Due to the time of day I received phone call (3:53) I suggested [facility] to her. She verbalized understanding and said that she would go to [facility].  Initial call taken by: Melody Comas,  November 05, 2010 3:56 PM  Follow-up for Phone Call        agree wit above  Follow-up by: Judith Part MD,  November 05, 2010 4:10 PM

## 2010-12-09 NOTE — Letter (Signed)
Summary: [facility]  [facility]   Imported By: Lanelle Bal 09/20/2010 11:50:33  _____________________________________________________________________  External Attachment:    Type:   Image     Comment:   External Document  Appended Document: [facility]     Clinical Lists Changes  Observations: Added new observation of PAST MED HX: GERD hyperlipedema                                                                                                                                                         breast CA --right Allergic rhinitis Osteoporosis HTN 3/10 cataracts- complications from eye surgery memory loss/ early dementia lichen sclerosis    gyn- Dr Algie Coffer (09/26/2010 19:36) Added new observation of FLU VAX: given (09/13/2010 19:36) Added new observation of TD BOOSTER: Tdap (09/13/2010 19:36)       Preventive Care Screening  Last Flu Shot:    Date:  09/13/2010    Results:  given  Last Tetanus Booster:    Date:  09/13/2010    Results:  Tdap    Past Medical History:    GERD    hyperlipedema                                                                                                                                                         breast CA --right    Allergic rhinitis    Osteoporosis    HTN 3/10    cataracts- complications from eye surgery    memory loss/ early dementia    lichen sclerosis             gyn- Dr Algie Coffer

## 2010-12-09 NOTE — Letter (Signed)
Summary: Results Follow up Letter  [place] [facility]  [address] [place], [place] [address]   Phone: [phone]  Fax: [phone]    10/18/2010 MRN: [id number]  Deborah Espinoza [address] [place], [place]  [address]  Dear Ms. Croswell,  The following are the results of your recent test(s):  Test         Result    Pap Smear:        Normal _____  Not Normal _____ Comments: ______________________________________________________ Cholesterol: LDL(Bad cholesterol):         Your goal is less than:         HDL (Good cholesterol):       Your goal is more than: Comments:  ______________________________________________________ Mammogram:        Normal __X__  Not Normal _____ Comments:Repeat in 1 year  ___________________________________________________________________ Hemoccult:        Normal _____  Not normal _______ Comments:    _____________________________________________________________________ Other Tests:    We routinely do not discuss normal results over the telephone.  If you desire a copy of the results, or you have any questions about this information we can discuss them at your next office visit.   Sincerely,      Sharilyn Sites for Dr. Roxy Manns

## 2010-12-09 NOTE — Progress Notes (Signed)
Summary: needs something for sunburn  Phone Note Call from Patient Call back at Home Phone [phone]   Caller: Patient Call For: Judith Part MD Summary of Call: Pt states she has a bad sunburn on her face.  She used a corn starch mixture this morning and that peeled the skin off and now the burn is hurting badly.  What can she do?  Uses [facility]. Initial call taken by: Lowella Petties CMA,  May 13, 2010 1:52 PM  Follow-up for Phone Call        can't send Rx because usually you just use OTC moisturizers unless very severe---then antibiotics may be needed  Okay to add on today if she needs to be seen. Otherwise, just use moisturizers If pain severe, needs to be seen Follow-up by: Cindee Salt MD,  May 13, 2010 2:09 PM  Additional Follow-up for Phone Call Additional follow up Details #1::        Advised pt, she will use moisterizers and see how she does tomorrow. Additional Follow-up by: Lowella Petties CMA,  May 13, 2010 3:19 PM

## 2010-12-09 NOTE — Assessment & Plan Note (Signed)
Summary: CHECK KNOT BEHIND L KNEE/CLE   Vital Signs:  Patient profile:   77 year old female Height:      59 inches Weight:      107.25 pounds BMI:     21.74 Temp:     98 degrees F oral Pulse rate:   72 / minute Pulse rhythm:   regular BP sitting:   146 / 74  (left arm) Cuff size:   regular  Vitals Entered By: Lewanda Rife LPN (June 04, 2010 3:23 PM) CC: Ck knot behind left knee. Pt has noticed knot for 3-4 months and pt has seen orthopedic dr.   History of Present Illness: that varicose vein in the back of leg went down and then came back up  both ankles are swollen knot on leg hurts sometimes when she puts on her stockings   tight to walk with  better after she elevates lags L leg generally aches at times     knee is not bothering her - knows she has bad OA- saw ortho   ? if drinks enough water in the heat no salty foods  elevates feet when she can no support stockings currently- ready to try them    Allergies: 1)  ! Pcn 2)  ! Codeine 3)  ! Hydrocodone 4)  ! Biaxin 5)  ! Keflex 6)  ! * Actonel 7)  ! Fosamax 8)  ! * Hctz  Past History:  Past Medical History: Last updated: 09/28/2009 GERD hyperlipedema                                                                                                                                                         breast CA --right Allergic rhinitis Osteoporosis HTN 3/10 cataracts- complications from eye surgery memory loss/ early dementia  Past Surgical History: Last updated: 10/16/2008 hosp 6/08 for chest pain, ruled out for MI stress perfusion cardiac study 7/08 nl with nl EF Dexa- osteopenia (2001) Hysterectomy- partial, fibroids (1987) Colonoscopy- polyp (09/2001) Dexa- improved OP  Cataract extraction (04/2003) Surgery- benign ovarian tumor (11/04) Oophorectomy- bilateral  Colonoscopy- polyps (09/2004) Dexa- OP (01/06) 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) Hosp- chest pain- r/o MI  (04/2007) stress nuclear study neg 7/08 colonoscopy adenomatous polyps 11/08 dexa 12/09 OP T score -3.0 spine and -2.5 in fem neck  gyn-- Dr Emelda Fear   Family History: Last updated: 08/28/2009 Father: brain cancer Mother: renal cell ca Siblings: 2 brothers with DM brother died at 5 of colon ca has an uncle who is healthy at age of 45  aunt - alzheimers   Social History: Last updated: 05/29/2008 Marital Status: Married Children:  Occupation:  non smoker no alcohol   Risk Factors: Smoking Status: never (04/27/2007)  Review of Systems General:  Denies fatigue, fever, loss of appetite,  and malaise. CV:  Complains of swelling of feet; denies chest pain or discomfort, palpitations, and shortness of breath with exertion. Resp:  Denies cough and wheezing. GI:  Denies abdominal pain, change in bowel habits, and indigestion. MS:  Complains of joint pain and stiffness; denies joint redness, joint swelling, muscle aches, and cramps. Derm:  Denies itching, lesion(s), poor wound healing, and rash. Neuro:  Denies numbness and tingling. Heme:  Denies abnormal bruising and bleeding.  Physical Exam  General:  slim/ frail appearing elderly female Head:  normocephalic, atraumatic, and no abnormalities observed.   Eyes:  vision grossly intact, pupils equal, pupils round, and pupils reactive to light.   Neck:  supple with full rom and no masses or thyromegally, no JVD or carotid bruit  Lungs:  Normal respiratory effort, chest expands symmetrically. Lungs are clear to auscultation, no crackles or wheezes. Heart:  Normal rate and regular rhythm. S1 and S2 normal without gallop, murmur, click, rub or other extra sounds. Msk:  poor rom both knees some changes of osteoarthritis Pulses:  R and L carotid,radial,femoral,dorsalis pedis and posterior tibial pulses are full and equal bilaterally Extremities:  1 plus pedal edema with pitting  2 by 3 cm mass behind L knee that is very soft and mobile-  resembling lipoma  varicosities noted bilat Neurologic:  sensation intact to light touch, gait normal, and DTRs symmetrical and normal.   Skin:  Intact without suspicious lesions or rashes Cervical Nodes:  No lymphadenopathy noted Psych:  normal affect, talkative and pleasant    Impression & Recommendations:  Problem # 1:  VARICOSE VEINS LOWER EXTREMITIES W/OTH COMPS (ICD-454.8) Assessment Deteriorated px support hose at 15 mm hg to try  adv to keep feet elevated also renal labs today suspect norvasc may be causing some swelling- but cannot stop it  no cardiac red flags  Orders: Doppler Referral (Doppler)  Problem # 2:  PERIPHERAL EDEMA (ICD-782.3) Assessment: Deteriorated see above Orders: T-Renal Function Panel [id number]) Venipuncture (29562) Specimen Handling (13086)  Problem # 3:  NEOPLASMS UNSPEC NATURE BONE SOFT TISSUE&SKIN (ICD-239.2) Assessment: New lump behind L knee feels in texture like lipoma - is nontender and slt mobile  will sched [place] to r/o varicosity or baker's cyst  Orders: Doppler Referral (Doppler)  Problem # 4:  LEG PAIN, LEFT (ICD-729.5) see above -- sched doppler Orders: Doppler Referral (Doppler)  Complete Medication List: 1)  Claritin Tabs (Loratadine tabs) .... As needed 2)  Flonase 50 Mcg/act Susp (Fluticasone propionate) .... 2 sprays in each nostril daily as needed 3)  Caltrate 600+d Tabs (Calcium carbonate-vitamin d tabs) .... Take 1 tablet by mouth two times a day 4)  Clobetasol Propionate 0.05 % Crea (Clobetasol propionate) .... Apply two times a week as needed 5)  Fosamax 70 Mg Tabs (Alendronate sodium) .Marland Kitchen.. 1 by mouth q week as directed 6)  Amlodipine Besylate 5 Mg Tabs (Amlodipine besylate) .Marland Kitchen.. 1 tab by mouth daily 7)  Vitamin D 1000 Unit Tabs (Cholecalciferol) .... Take 1 tablet by mouth once a day 8)  Aricept 5 Mg Tabs (Donepezil hcl) .Marland Kitchen.. 1 by mouth once daily 9)  Support Hose To The Thigh 15 Mm Hg  .... Wear once daily for  pain and swelling from varicose veins 454.8  Patient Instructions: 1)  please get px filled for support stockings and start to wear them 2)  labs today for swelling 3)  we will schedule doppler appt for leg (ultrasound) at check out Prescriptions: SUPPORT HOSE TO THE THIGH  15 MM HG wear once daily for pain and swelling from varicose veins 454.8  #1 pr x 1   Entered and Authorized by:   Judith Part MD   Signed by:   Judith Part MD on 06/04/2010   Method used:   Print then Give to Patient   RxID:   [id number] SUPPORT HOSE TO WAIST HG wear once daily for pain and swelling from varicose veins 454.8  #1 pair x 1   Entered and Authorized by:   Judith Part MD   Signed by:   Judith Part MD on 06/04/2010   Method used:   Print then Give to Patient   RxID:   [id number]   Current Allergies (reviewed today): ! PCN ! CODEINE ! HYDROCODONE ! BIAXIN ! KEFLEX ! * ACTONEL ! FOSAMAX ! * HCTZ

## 2010-12-09 NOTE — Assessment & Plan Note (Signed)
Summary: B/P CHECK   Nurse Visit   Vital Signs:  Patient profile:   77 year old female Height:      59 inches Weight:      105 pounds BMI:     21.28 Pulse rate:   68 / minute Pulse rhythm:   regular BP sitting:   144 / 60  (left arm) Cuff size:   regular  Vitals Entered By: Delilah Shan CMA Duncan Dull) (April 16, 2010 2:14 PM)  Allergies: 1)  ! Pcn 2)  ! Codeine 3)  ! Hydrocodone 4)  ! Biaxin 5)  ! Keflex 6)  ! * Actonel 7)  ! Fosamax 8)  ! * Hctz  Orders Added: 1)  Est. Patient Level I [[address]]

## 2010-12-09 NOTE — Progress Notes (Signed)
Summary: Cat Scratch  Phone Note Call from Patient Call back at Home Phone [phone]   Caller: Patient Call For: Judith Part MD Summary of Call: pt states yesterday at 10:00am she got scratched on her finger by a cat, and she would like to know if she can get a "shot of antibiotic" she had 2 cousins to die from cat scratch disease. I advised pt she may need to be seen. She cleaned the area with peroxide. Please advise. Initial call taken by: Mervin Hack CMA Duncan Dull),  November 10, 2009 9:39 AM  Follow-up for Phone Call        keep wound very clean and use triple abx ointment  follow up when able  Follow-up by: Judith Part MD,  November 10, 2009 10:34 AM  Additional Follow-up for Phone Call Additional follow up Details #1::        Patient Advised.  Additional Follow-up by: Delilah Shan CMA (AAMA),  November 10, 2009 10:43 AM

## 2010-12-09 NOTE — Progress Notes (Signed)
Summary: wants blood work for vertigo  Phone Note Call from Patient Call back at Home Phone [phone]   Caller: Patient Call For: Judith Part MD Summary of Call: Pt is to be scheduled with ENT for vertigo but she is asking if she asking if she should have some blood work checked first, she particularly wants her hgb checked. Initial call taken by: Lowella Petties CMA, AAMA,  October 14, 2010 4:33 PM  Follow-up for Phone Call        that is fine  please check basic panel and tsh and cbc with diff for dizziness Follow-up by: Judith Part MD,  October 15, 2010 8:04 AM  Additional Follow-up for Phone Call Additional follow up Details #1::        Patient Advised.  Lab appointment scheduled  10/15/10 at 2 p.m.  Lugene Fuquay CMA (AAMA)  October 15, 2010 10:00 AM

## 2010-12-09 NOTE — Assessment & Plan Note (Signed)
Summary: LEFT EAR PAIN/EVM   Vital Signs:  Patient Profile:   77 Years Old Female Height:     59 inches Weight:      102 pounds BMI:     20.68 O2 Sat:      100 % Temp:     97.3 degrees F Pulse rate:   71 / minute BP sitting:   177 / 83  (right arm) Cuff size:   regular  Pt. in pain?   yes    Location:   neck    Intensity:   9    Type:       sharp  Vitals Entered By: Haze Boyden, CMA (November 06, 2010 3:58 PM)                   Current Allergies (reviewed today): ! PCN ! CODEINE ! HYDROCODONE ! BIAXIN ! KEFLEX ! * ACTONEL ! FOSAMAX ! * HCTZHistory of Present Illness Chief Complaint: left neck pain since 12/22. History of Present Illness: patient was on step stool at home which wasn't locked and she fell off landing with a thud which was not loud enough for her husband to hear in the next room. she called and he helped her up and she c/o pain right scalp. no loc. the next day she had pain in the neck left> posterior. she continued normal [employer] long car trips. today it's slightly worse and she's having trouble turning her neck. patient denies lose of limb function, numbness. recent work up for dizziness hasn't found anything so far and it has responded to time and meclizine. she's sure this had nothing to do with a fall.   REVIEW OF SYSTEMS Constitutional Symptoms       Complains of fatigue.     Denies fever, chills, night sweats, weight loss, and weight gain.      Comments: unchanged from usual Eyes       Complains of eye drainage, glasses, and eye surgery.      Denies change in vision, eye pain, and contact lenses.      Comments: iol and corneal transplants Ear/Nose/Throat/Mouth       Complains of dizziness and sore throat.      Denies hearing loss/aids, change in hearing, ear pain, ear discharge, frequent runny nose, frequent nose bleeds, sinus problems, hoarseness, and tooth pain or bleeding.      Comments: mild Respiratory       Denies dry cough,  productive cough, wheezing, shortness of breath, asthma, bronchitis, and emphysema/COPD.  Cardiovascular       Denies murmurs, chest pain, and tires easily with exhertion.    Gastrointestinal       Denies stomach pain, nausea/vomiting, diarrhea, constipation, blood in bowel movements, and indigestion. Genitourniary       Denies painful urination, kidney stones, and loss of urinary control. Neurological       Denies paralysis, seizures, and fainting/blackouts. Musculoskeletal       Complains of joint stiffness.      Denies muscle pain, joint pain, decreased range of motion, redness, swelling, muscle weakness, and gout.  Skin       Denies bruising, unusual mles/lumps or sores, and hair/skin or nail changes.  Psych       Denies mood changes, temper/anger issues, anxiety/stress, speech problems, depression, and sleep problems.  Past History:  Past Medical History: Last updated: 09/26/2010 GERD hyperlipedema  breast CA --right Allergic rhinitis Osteoporosis HTN 3/10 cataracts- complications from eye surgery memory loss/ early dementia lichen sclerosis    gyn- Dr Algie Coffer  Past Surgical History: Last updated: 10/16/2008 hosp 6/08 for chest pain, ruled out for MI stress perfusion cardiac study 7/08 nl with nl EF Dexa- osteopenia (2001) Hysterectomy- partial, fibroids (1987) Colonoscopy- polyp (09/2001) Dexa- improved OP  Cataract extraction (04/2003) Surgery- benign ovarian tumor (11/04) Oophorectomy- bilateral  Colonoscopy- polyps (09/2004) Dexa- OP (01/06) 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) Hosp- chest pain- r/o MI (04/2007) stress nuclear study neg 7/08 colonoscopy adenomatous polyps 11/08 dexa 12/09 OP T score -3.0 spine and -2.5 in fem neck  gyn-- Dr Emelda Fear   Family History: Last updated:  08/28/2009 Father: brain cancer Mother: renal cell ca Siblings: 2 brothers with DM brother died at 44 of colon ca has an uncle who is healthy at age of 33  aunt - alzheimers   Social History: Last updated: 05/29/2008 Marital Status: Married Children:  Occupation:  non smoker no alcohol   Problems Prior to Update: 1)  Vertigo  (ICD-780.4) 2)  Varicose Veins Lower Extremities W/oth Comps  (ICD-454.8) 3)  Constipation  (ICD-564.00) 4)  Hemorrhoids, External  (ICD-455.3) 5)  Memory Loss  (ICD-780.93) 6)  Peripheral Edema  (ICD-782.3) 7)  Hypertension, Essential Nos  (ICD-401.9) 8)  Decreased Hearing  (ICD-389.9) 9)  Health Maintenance Exam  (ICD-V70.0) 10)  Other Screening Mammogram  (ICD-V76.12) 11)  Elevated Blood Pressure Without Diagnosis of Hypertension  (ICD-796.2) 12)  Constipation, Chronic  (ICD-564.09) 13)  Colonic Polyps, Hx of  (ICD-V12.72) 14)  Hyperlipidemia  (ICD-272.2) 15)  Hx of Urticaria  (ICD-708.9) 16)  Hx of Lichen Nos  (ICD-697.9) 17)  Osteoporosis  (ICD-733.00) 18)  Allergic Rhinitis  (ICD-477.9)  Medications Prior to Update: 1)  Flonase 50 Mcg/act  Susp (Fluticasone Propionate) .... 2 Sprays in Each Nostril Daily As Needed 2)  Caltrate 600+d   Tabs (Calcium Carbonate-Vitamin D Tabs) .... Take 1 Tablet By Mouth Two Times A Day 3)  Clobetasol Propionate 0.05 %  Crea (Clobetasol Propionate) .... Apply Two Times A Week As Needed 4)  Fosamax 70 Mg  Tabs (Alendronate Sodium) .Marland Kitchen.. 1 By Mouth Q Week As Directed 5)  Amlodipine Besylate 5 Mg Tabs (Amlodipine Besylate) .Marland Kitchen.. 1 Tab By Mouth Daily On Hold Nov 11 6)  Vitamin D 1000 Unit  Tabs (Cholecalciferol) .... Take 1 Tablet By Mouth Once A Day 7)  Aricept 5 Mg Tabs (Donepezil Hcl) .Marland Kitchen.. 1 By Mouth Once Daily 8)  Support Hose To The Thigh 15 Mm Hg .... Wear Once Daily For Pain and Swelling From Varicose Veins 454.8 9)  Meclizine Hcl 25 Mg Tabs (Meclizine Hcl) .Marland Kitchen.. 1 By Mouth Three Times A Day As Needed  Dizziness  Current Medications (verified): 1)  Flonase 50 Mcg/act  Susp (Fluticasone Propionate) .... 2 Sprays in Each Nostril Daily As Needed 2)  Caltrate 600+d   Tabs (Calcium Carbonate-Vitamin D Tabs) .... Take 1 Tablet By Mouth Two Times A Day 3)  Clobetasol Propionate 0.05 %  Crea (Clobetasol Propionate) .... Apply Two Times A Week As Needed 4)  Fosamax 70 Mg  Tabs (Alendronate Sodium) .Marland Kitchen.. 1 By Mouth Q Week As Directed 5)  Amlodipine Besylate 5 Mg Tabs (Amlodipine Besylate) .Marland Kitchen.. 1 Tab By Mouth Daily On Hold Nov 11 6)  Vitamin D 1000 Unit  Tabs (Cholecalciferol) .... Take 1 Tablet By Mouth Once A Day 7)  Aricept 5 Mg Tabs (  Donepezil Hcl) .Marland Kitchen.. 1 By Mouth Once Daily 8)  Support Hose To The Thigh 15 Mm Hg .... Wear Once Daily For Pain and Swelling From Varicose Veins 454.8 9)  Meclizine Hcl 25 Mg Tabs (Meclizine Hcl) .Marland Kitchen.. 1 By Mouth Three Times A Day As Needed Dizziness  Allergies (verified): 1)  ! Pcn 2)  ! Codeine 3)  ! Hydrocodone 4)  ! Biaxin 5)  ! Keflex 6)  ! * Actonel 7)  ! Fosamax 8)  ! * Hctz   CC:  pt fell and hurt her neck.  Assessment New Problems: NECK SPRAIN AND STRAIN (ICD-847.0)   The patient and/or caregiver has been counseled thoroughly with regard to medications prescribed including dosage, schedule, interactions, rationale for use, and possible side effects and they verbalize understanding.  Diagnoses and expected course of recovery discussed and will return if not improved as expected or if the condition worsens. Patient and/or caregiver verbalized understanding.   Patient Instructions: 1)  patient was urged to go to a hosp ed for xrays, eval, and probably and cervical collar but she and husband refused. They understand the risk of paralysis and death should an undiagnosed neck fx be present. 2)  rx for xrays at imaging ctr, [facility] for [employer] given, and rx for soft cervical collar given and she understands that she won't be able to find one for the next 2  days. 3)  urged to continue tylenol, and possibly add an nsaid if tolerated.

## 2010-12-09 NOTE — Letter (Signed)
Summary: [facility]  [facility]   Imported By: Lester [place] 11/09/2010 10:09:21  _____________________________________________________________________  External Attachment:    Type:   Image     Comment:   External Document

## 2010-12-09 NOTE — Assessment & Plan Note (Signed)
Summary: HEMMORHOIDS PER DR Hae Ahlers/[place]   Vital Signs:  Patient profile:   77 year old female Height:      59 inches Weight:      105 pounds BMI:     21.28 Temp:     97.6 degrees F oral Pulse rate:   76 / minute Pulse rhythm:   regular BP sitting:   104 / 70  (left arm) Cuff size:   regular  Vitals Entered By: Lewanda Rife LPN (January 06, 453 4:06 PM)  History of Present Illness: has hemorroids  went to the dermatologist and was told this   pain in rectum to go to the bathroom and something she could feel and it is sore  is feeling a little better today  is not putting anything on it  no itch  no blood in stool   little constipation lately and some straining  also some heavy lifting  stools are sometimes hard to pass as well    otherwise feels ok    Allergies: 1)  ! Pcn 2)  ! Codeine 3)  ! Hydrocodone 4)  ! Biaxin 5)  ! Keflex 6)  ! * Actonel 7)  ! Fosamax 8)  ! * Hctz  Past History:  Past Medical History: Last updated: 09/28/2009 GERD hyperlipedema                                                                                                                                                         breast CA --right Allergic rhinitis Osteoporosis HTN 3/10 cataracts- complications from eye surgery memory loss/ early dementia  Past Surgical History: Last updated: 10/16/2008 hosp 6/08 for chest pain, ruled out for MI stress perfusion cardiac study 7/08 nl with nl EF Dexa- osteopenia (2001) Hysterectomy- partial, fibroids (1987) Colonoscopy- polyp (09/2001) Dexa- improved OP  Cataract extraction (04/2003) Surgery- benign ovarian tumor (11/04) Oophorectomy- bilateral  Colonoscopy- polyps (09/2004) Dexa- OP (01/06) 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) Hosp- chest pain- r/o MI (04/2007) stress nuclear study neg 7/08 colonoscopy adenomatous polyps 11/08 dexa 12/09 OP T score -3.0 spine and -2.5 in fem neck  gyn-- Dr Emelda Fear   Family  History: Last updated: 08/28/2009 Father: brain cancer Mother: renal cell ca Siblings: 2 brothers with DM brother died at 33 of colon ca has an uncle who is healthy at age of 37  aunt - alzheimers   Social History: Last updated: 05/29/2008 Marital Status: Married Children:  Occupation:  non smoker no alcohol   Risk Factors: Smoking Status: never (04/27/2007)  Review of Systems General:  Denies fatigue, fever, loss of appetite, malaise, and weight loss. CV:  Denies chest pain or discomfort. Resp:  Denies cough and shortness of breath. GI:  Complains of constipation; denies abdominal pain, bloody stools, change in bowel habits,  dark tarry stools, indigestion, nausea, and vomiting. Derm:  Complains of itching; denies poor wound healing and rash. Heme:  Denies abnormal bruising and bleeding.  Physical Exam  General:  Well-developed,well-nourished,in no acute distress; alert,appropriate and cooperative throughout examination Head:  normocephalic, atraumatic, and no abnormalities observed.   Mouth:  pharynx pink and moist.   Neck:  supple with full rom and no masses or thyromegally, no JVD or carotid bruit  Lungs:  Normal respiratory effort, chest expands symmetrically. Lungs are clear to auscultation, no crackles or wheezes. Heart:  Normal rate and regular rhythm. S1 and S2 normal without gallop, murmur, click, rub or other extra sounds. Abdomen:  Bowel sounds positive,abdomen soft and non-tender without masses, organomegaly or hernias noted. no suprapubic tenderness or fullness felt  Rectal:  ext non thrombosed hemorroid at 6:00- mildly tender nl tone and heme neg stool Skin:  Intact without suspicious lesions or rashes Inguinal Nodes:  No significant adenopathy Psych:  normal affect, talkative and pleasant    Impression & Recommendations:  Problem # 1:  HEMORRHOIDS, EXTERNAL (ICD-455.3) Assessment New  small non thrombosed ext hem disc nature of hem and need to avoid  straining or hard stools given anusol hc to try rec sitz baths / tuks pads for cleansing  rec made to soften stools  pt advised to update me if symptoms worsen or do not improve   Orders: Prescription Created Electronically [phone])  Problem # 2:  CONSTIPATION (ICD-564.00) Assessment: New intermittent- exacerbated by above  disc measures to help - stool softener or miralax update if worse or abd pain  Complete Medication List: 1)  Prednisone Soln (Prednisone soln) .... Drops as needed 2)  Claritin Tabs (Loratadine tabs) .... As needed 3)  Flonase 50 Mcg/act Susp (Fluticasone propionate) .... 2 sprays in each nostril qd 4)  Caltrate 600+d Tabs (Calcium carbonate-vitamin d tabs) .... Take 1 tablet by mouth two times a day 5)  [place] 5 % Soln (Sodium chloride (hypertonic)) .Marland Kitchen.. 1 drop each eye two times a day 6)  Clobetasol Propionate 0.05 % Crea (Clobetasol propionate) .... Apply two times a week as needed 7)  Fosamax 70 Mg Tabs (Alendronate sodium) .Marland Kitchen.. 1 by mouth q week as directed 8)  Amlodipine Besylate 5 Mg Tabs (Amlodipine besylate) .Marland Kitchen.. 1 tab by mouth daily 9)  Vitamin D 1000 Unit Tabs (Cholecalciferol) .... Take 1 tablet by mouth once a day 10)  Aricept 5 Mg Tabs (Donepezil hcl) .Marland Kitchen.. 1 by mouth once daily 11)  Anusol-hc 2.5 % Crea (Hydrocortisone) .... Apply to hemorroid two times a day for 10 days  Patient Instructions: 1)  use stool softener two times a day or miralax once daily to keep stools soft and minimize straining  2)  use the anusol hc cream two times a day for 10 days to the hemorroid  3)  if it hurts to sit- you can purchase a soft "donut" seat to use  4)  if hemorroid does not improve in 10 days or if it gets worse please update me  Prescriptions: ANUSOL-HC 2.5 % CREA (HYDROCORTISONE) apply to hemorroid two times a day for 10 days  #1 small x 1   Entered and Authorized by:   Judith Part MD   Signed by:   Judith Part MD on 01/06/2010   Method used:    [facility] to        [facility] [phone]* (retail)       [address]  [address]       [place], [place]  [address]       Ph: [phone] or [phone]       Fax: [phone]   RxID:   [id number]   Current Allergies (reviewed today): ! PCN ! CODEINE ! HYDROCODONE ! BIAXIN ! KEFLEX ! * ACTONEL ! FOSAMAX ! * HCTZ

## 2010-12-09 NOTE — Letter (Signed)
Summary: [facility]   Imported By: Lanelle Bal 01/07/2010 12:06:52  _____________________________________________________________________  External Attachment:    Type:   Image     Comment:   External Document

## 2010-12-09 NOTE — Progress Notes (Signed)
Summary: Meclizine 25mg  and update on pt's dizziness  Phone Note Refill Request Call back at [phone] Message from:  [facility] on October 12, 2010 4:17 PM  Refills Requested: Medication #1:  MECLIZINE HCL 25 MG TABS 1 by mouth three times a day as needed dizziness.   Last Refilled: 09/24/2010 [facility] electronically request refill on Meclizine. Last refill date 09/24/10. I called pt and she is a little better with dizziness but pt said has dizziness everyday. Dizziness can happen at anytime even when pt is sitting. 10/11/10 pt was rolling wheel barrow with wood to her house and she got so dizzy her husband had to help her inside. Pt said that was the worse dizzy spell she has had.  Pt has no h/a, no chest pain or SOB. Pt had some stuffiness in nose yesterday but pt has been using flonase every day for allergies. Pt has been taking Meclizine twice a day since seen on 09/24/10. Pt has 2 more pills left.Please advise.    Method Requested: Electronic Initial call taken by: Lewanda Rife LPN,  October 12, 2010 4:22 PM  Follow-up for Phone Call        can refil meclizine px written on EMR for call in  given she is still so symptomatic I would like to refer her to ENT for further eval  let me know if agreeable and I will do ref  Follow-up by: Judith Part MD,  October 12, 2010 8:08 PM  Additional Follow-up for Phone Call Additional follow up Details #1::        Left message for patient to call back. [facility]ication [facility] as instructed. Lewanda Rife LPN  October 13, 2010 9:59 AM   Patient returned call and was notified of rx.  Additional Follow-up by: Melody Comas,  October 13, 2010 2:21 PM    Prescriptions: MECLIZINE HCL 25 MG TABS (MECLIZINE HCL) 1 by mouth three times a day as needed dizziness  #30 x 0   Entered and Authorized by:   Judith Part MD   Signed by:   Lewanda Rife LPN on 11/91/4782   Method used:   Telephoned to ...       [facility]  [phone]* (retail)       [address] [place], [place]  [address]       Ph: [phone] or [phone]       Fax: [phone]   RxID:   [id number]   Appended Document: Meclizine 25mg  and update on pt's dizziness Patient notified as instructed by telephone. Pt said she is agreeable to do ENT referral. Pt said she would like Dr Milinda Antis to chose the best ENT whether in [place] or [place]. Pt will wait to hear from pt care coordinator. Pt can be reached at [phone].  Appended Document: Meclizine 25mg  and update on pt's dizziness  will do ref for [facility]   Clinical Lists Changes  Orders: Added new Referral order of ENT Referral (ENT) - Signed

## 2010-12-09 NOTE — Letter (Signed)
Summary: [facility]   Imported By: Maryln Gottron 05/12/2010 14:19:28  _____________________________________________________________________  External Attachment:    Type:   Image     Comment:   External Document  Appended Document: [facility] please let pt know I reviewed her ortho note looks like knee problem was the concern please update me with how her symptoms are- pain and swelling  Appended Document: [facility] Patient notified as instructed by telephone. Pt said she is having no pain or swelling in her knees. Pt said she wears support stockings. Pt said swelling in left ankle is much better than it was ( just slight swelling in lt ankle now.)  Appended Document: [facility] thanks for the update - let me know if symptoms worsen  Appended Document: [facility] Patient notified as instructed by telephone.

## 2010-12-09 NOTE — Progress Notes (Signed)
  Phone Note Outgoing Call   Call placed by: Leonette Most md Call placed to: Patient Action Taken: Information Sent Reason for Call: Discuss lab or test results Summary of Call: given c spine results. patient taking tylenol and advil and is slightly better. plan: to f/u dr. tower if worse or not alot better in a few weeks.

## 2010-12-30 ENCOUNTER — Telehealth: Payer: Self-pay | Admitting: Family Medicine

## 2011-01-04 NOTE — Progress Notes (Signed)
Summary: Amlodipine Besylate  Phone Note Refill Request Message from:  Fax from Pharmacy on December 30, 2010 3:01 PM  Refills Requested: Medication #1:  AMLODIPINE BESYLATE 5 MG TABS 1 tab by mouth daily ON HOLD NOV 11 Not sure if I should refill this.  Note on meds list says it was held and then pt. wasn't sure if she was taking it. [facility] [address]     Method Requested: Electronic Initial call taken by: Delilah Shan CMA Duncan Dull),  December 30, 2010 3:02 PM  Follow-up for Phone Call        I want her to take it  px written on EMR for call in  Follow-up by: Judith Part MD,  December 30, 2010 3:16 PM  Additional Follow-up for Phone Call Additional follow up Details #1::        Done. Additional Follow-up by: Delilah Shan CMA Duncan Dull),  December 30, 2010 5:33 PM    New/Updated Medications: AMLODIPINE BESYLATE 5 MG TABS (AMLODIPINE BESYLATE) 1 tab by mouth daily Prescriptions: AMLODIPINE BESYLATE 5 MG TABS (AMLODIPINE BESYLATE) 1 tab by mouth daily  #30 x 11   Entered by:   Delilah Shan CMA (AAMA)   Authorized by:   Judith Part MD   Signed by:   Delilah Shan CMA Duncan Dull) on 12/30/2010   Method used:   Electronically to        [facility] [phone]* (retail)       [address] [place], [place]  [address]       Ph: [phone] or [phone]       Fax: [phone]   RxID:   [id number] AMLODIPINE BESYLATE 5 MG TABS (AMLODIPINE BESYLATE) 1 tab by mouth daily  #30 x 11   Entered and Authorized by:   Judith Part MD   Signed by:   Judith Part MD on 12/30/2010   Method used:   Telephoned to ...       [facility] [phone]* (retail)       [address] [place], [place]  [address]       Ph: [phone] or [phone]       Fax: [phone]   RxID:   [id number]

## 2011-01-26 ENCOUNTER — Other Ambulatory Visit: Payer: Self-pay | Admitting: Family Medicine

## 2011-01-28 ENCOUNTER — Telehealth: Payer: Self-pay | Admitting: *Deleted

## 2011-01-28 MED ORDER — ALENDRONATE SODIUM 70 MG PO TABS: ORAL_TABLET | ORAL | Status: DC

## 2011-01-28 NOTE — Telephone Encounter (Signed)
Rx sent to pharmacy   

## 2011-03-08 ENCOUNTER — Encounter: Payer: Self-pay | Admitting: Family Medicine

## 2011-03-09 ENCOUNTER — Ambulatory Visit: Payer: Self-pay | Admitting: Family Medicine

## 2011-03-09 DIAGNOSIS — Z0289 Encounter for other administrative examinations: Secondary | ICD-10-CM

## 2011-03-22 NOTE — H&P (Signed)
NAME:  Deborah Espinoza, Deborah Espinoza NO.:  [id number]   MEDICAL RECORD NO.:  [id number]          PATIENT TYPE:  OBV   LOCATION:  3703                         FACILITY:  [facility]   PHYSICIAN:  Jonelle Sidle, MD DATE OF BIRTH:  [birth year]   DATE OF ADMISSION:  04/27/2007  DATE OF DISCHARGE:  04/28/2007                              HISTORY & PHYSICAL   PRIMARY CARE Alois Mincer:  Dr. Roxy Manns   CARDIOLOGIST:  Patient is new to [facility] being seen by Dr.  Nona Dell   PATIENT PROFILE:  A 77 year old married Caucasian female with no prior  history of coronary artery disease who presents with pleuritic chest  pain.   PROBLEM LIST:  1. Chest pain.  2. 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  3. Chronic rhinitis.  4. Right breast CA status post partial mastectomy.  5. History of adnexal mass/benign ovarian neoplasm status post      bilateral oophorectomy.  6. Status post hysterectomy.  7. History of lichen sclerosus of the buttocks.  8. Untreated hypokalemia.   HISTORY OF PRESENT ILLNESS:  A 77 year old married Caucasian female with  a history of mild hyperlipidemia who was in her usual state of health  until this morning at  approximately 9:00 a.m. when while eating  breakfast she developed 9/10 focal, left, sharp, shooting chest pain  only with inspiration without associated shortness of breath, nausea or  vomiting, diaphoresis or lightheadedness.  Symptoms persisted and she  was seen by Arizona Constable, nurse practitioner, at [facility] and was  noted to have an elevated blood pressure and ECG read out as anterior  infarct of unknown age.  EMS was activated and the patient was taken to  the [facility] ED.  Here, she says her pain is 3 to 4/10 with similar  character as this morning, but now only occurring with deep inspiration.  She has not been given anything to reduce her pains.  She complains of  mild headache currently as well.  ECG here  shows sinus rhythm with good  R wave progression and minimal ST elevation 2, 3, aVF, V2, V3 which have  the look of early repolarization.   ALLERGIES:  PENICILLIN, FOSAMAX, CODEINE, HYDROCODONE, BIAXIN, KEFLEX,  ACTONEL.   HOME MEDICATIONS:  1. Prednisolone eye drops b.i.d.  2. Claritin 10 mg q.d.  3. Flonase two squirts q.d.  4. Caltrate 600 plus D.  5. Clobetasol for the lichen sclerosus p.r.n.  6. Fortical q.d.   FAMILY HISTORY:  Mother died of cancer at age 59, father died of brain  cancer at age 30.  She has three brothers, one died of cancer and the  other two have diabetes.  There is no family history of coronary artery  disease or CVA.   SOCIAL HISTORY:  Patient lives in [place] with her husband of 49  years.  She is retired from office work Clinical cytogeneticist.  She has two grown  children.  She denies any tobacco, alcohol or drug use.  She is active  at home doing both yard and housework without limitations.  She also  walks several days a week for exercise without limitations, chest pain  or shortness of breath.   REVIEW OF SYSTEMS:  Positive for current headache, vision loss, lichen  sclerosus of the buttocks, pleuritic chest pain as outlined in the HPI.  All other systems reviewed are negative.   PHYSICAL EXAMINATION:  VITAL SIGNS:  Temperature 97.7, heart rate 68,  respirations 16, blood pressure 182/83, pulse ox 99% on room air.  GENERAL:  A pleasant white female in no acute distress, awake, alert and  oriented x3.  HEENT:  Normal.  NECK:  No bruits or JVD.  LUNGS:  Respirations regular and unlabored.  Clear to auscultation.  CARDIAC:  Regular S1-S2.  No S3-S4 or murmurs.  ABDOMEN:  Round, soft, nontender, nondistended.  Bowel sounds are  present x4.  EXTREMITIES:  Warm, dry, pink.  No clubbing, cyanosis or edema.  Dorsalis pedis, posterior tibial pulses 2+ and equal bilaterally.  There  were no femoral bruits noted.  NEURO:  Grossly intact and nonfocal.    Chest x-ray is pending.   EKG shows sinus rhythm with a rate of 62 beats per minute, normal axis  and no acute changes compared with ECG in January of 2008.   Hemoglobin 16.0, hematocrit 47.0, sodium 136, potassium 4.6, chloride  104, CO2 30.6, BUN 8, creatinine 0.8, glucose 99.   ASSESSMENT/PLAN:  1. Chest pain atypical, focal, and pleuritic.  Plan to admit and cycle      cardiac enzymes.  We will check a D-dimer, 2-D echocardiogram and      chest x-ray.  If all are negative, probable discharge in the a.m.      with outpatient Myoview and followup.  2. Hypertension.  Blood pressure currently elevated.  She has no      documented history of hypertension.  We will add low-dose beta-      blocker.  Cardiac markers are negative and there is no clear      indication for beta-blocker beyond that point, would consider      switch to diuretic.  3. Hyperlipidemia.  Check lipids and LFTs, add statin if appropriate.  4. Status post corneal transplant/progressive vision loss.  Patient      uses steroid eye drops in the left eye, but she is not sure of      exactly what they are.  I have asked her to find out what they are      so that we can write for them.      [name], ANP      Jonelle Sidle, MD  Electronically Signed    CB/MEDQ  D:  04/27/2007  T:  04/28/2007  Job:  [id number]

## 2011-03-22 NOTE — Discharge Summary (Signed)
NAMESHARIFA, BUCHOLZ NO.:  [id number]   MEDICAL RECORD NO.:  [id number]          PATIENT TYPE:  OBV   LOCATION:  3703                         FACILITY:  [facility]   PHYSICIAN:  Learta Codding, MD,FACC DATE OF BIRTH:  1933/08/15   DATE OF ADMISSION:  04/27/2007  DATE OF DISCHARGE:  04/28/2007                               DISCHARGE SUMMARY   PROCEDURES:  None.   DISCHARGE DIAGNOSES:  Chest pain.  Cardiac enzymes negative for  myocardial infarction and outpatient Myoview planned.   SECONDARY DIAGNOSES:  1. Status post hysterectomy.  2. Arthritis.  3. Hyperlipidemia.  4. History of benign ovarian neoplasm removed with exploratory      laparotomy.  5. History of ductal carcinoma in situ of the right breast with      partial mastectomy.  6. Status post tonsillectomy and appendectomy.   TIME AT DISCHARGE:  Thirty-three minutes.   HOSPITAL COURSE:  Deborah Espinoza is a 77 year old female with no previous  history of coronary artery disease.  She had sharp, focal, pleuritic,  chest pain while eating breakfast on the day of admission.  She went to  her primary care physician's office and her EKG had some nonspecific ST  changes.  She was sent to the emergency room for further evaluation.   On initial evaluation in the emergency room, her systolic blood pressure  was in the 180s.  However, it came down and was in the 140s and 150s.  She had a blood pressure of 118/70 at discharge.  She was afebrile and  her labs were within normal limits, except for a mildly elevated  alkaline phosphatase at 124.  Her glucose was also mildly elevated at  107, but this was not fasting.  Serial CK-MB and troponin I were  negative x3.  A TSH was within normal limits at 0.971.  A chest x-ray  showed possible cartilage calcification but no acute disease.  A D-dimer  was also checked and was within normal limits at 0.32.   On April 28, 2007, Deborah Espinoza's symptoms had resolved.  She had a  3-beat  run of nonsustained VT but she was asymptomatic with this.  An  echocardiogram was performed which showed an EF of 55-65% with no  regional wall motion abnormalities and a very small pericardial effusion  with no significant valvular abnormalities.   Deborah Espinoza was evaluated by Dr. Gala Romney, and it was felt that she was  low risk.  He felt that she could be discharged home with an outpatient  stress test and a baby aspirin added to her medication regimen.   DISCHARGE INSTRUCTIONS:  1. Her activity level is to be increased gradually.  2. She is to follow up with Dr. Milinda Antis as needed.  3. She will follow up with Dr. Diona Browner and a message has been left      with our office.  4. She is to stick to a heart healthy diet.   DISCHARGE MEDICATIONS:  1. Aspirin 81 mg daily.  2. Claritin 10 mg daily.  3. Vitamins as prior to admission.  4. Prednisolone  eye drops 1% to the right eye b.i.d.  5. Saline eye drops to both eyes b.i.d.      Theodore Demark, PA-C      Learta Codding, MD,FACC  Electronically Signed    RB/MEDQ  D:  04/28/2007  T:  04/28/2007  Job:  [id number]   cc:   Marne A. Milinda Antis, MD

## 2011-03-22 NOTE — Assessment & Plan Note (Signed)
[facility] OFFICE NOTE   Deborah Espinoza, Deborah Espinoza                      MRN:          [id number]  DATE:05/18/2007                            DOB:          [birth year]    PRIMARY CARE PHYSICIAN:  Marne A. Milinda Antis, M.D.   REASON FOR PRESENTATION:  Evaluate patient with chest pain and recent  hospitalization.   HISTORY OF PRESENT ILLNESS:  The patient presents for followup of a  recent admission for chest discomfort.  She is 77 years old.  She was  admitted on June 20.  She ruled out for myocardial infarction.  She did  have an echocardiogram which demonstrated a well preserved ejection  fraction.  There were no valvular abnormalities.  There was a very small  pericardial effusion.  Her pain sounded somewhat pleuritic at that time.  She had an outpatient stress perfusion study 3 days ago which  demonstrated no ischemia or infarct and a well preserved ejection  fraction.  She says that since the hospitalization she has had no pain.  She has had no shortness of breath, PND, or orthopnea.  She remains  active.  She is working in her garden.  She has had no chest pressure,  neck or arm discomfort.  She has had no fever or chills.  She has had no  presyncope or syncope.   PAST MEDICAL HISTORY:  1. Chronic rhinitis.  2. Breast cancer, status post mastectomy.  3. Adnexal mass benign.  4. Apparent ovarian neoplasm, status post bilateral oophorectomy.  5. Hysterectomy.  6. Right corneal transplant, November 2007.   ALLERGIES:  1. PENICILLIN.  2. FOSAMAX.  3. CODEINE.  4. HYDROCODONE.  5. BIAXIN.  6. MORPHINE SULFATE.   MEDICATIONS:  1. Aspirin 81 mg, two tablets daily.  2. Claritin.  3. Prednisone.  4. Saline.  5. Caltrate.  6. Nasal spray.  7. Fluticasone nasal spray.  8. Clobetasol every other day.   REVIEW OF SYSTEMS:  As stated in the HPI, otherwise negative for other  systems.   PHYSICAL EXAMINATION:   GENERAL:  The patient is in no distress.  VITAL SIGNS:  Blood pressure 132/82, heart rate 64 and regular, weight  104 pounds.  NECK:  No jugular venous distention at 45 degrees.  Carotid upstroke  brisk and symmetric.  No bruits.  No thyromegaly.  LYMPHATICS:  No cervical, axillary, or inguinal adenopathy.  LUNGS:  Clear to auscultation bilaterally.  BACK:  No costovertebral angle tenderness.  CHEST:  Unremarkable.  HEART:  PMI not displaced or sustained.  S1 and S2 within normal limits.  No S3, no S4, no clicks, no rubs, no murmurs.  ABDOMEN:  Flat.  Positive bowel sounds, normal in frequency and pitch.  No bruits.  No rebound.  No guarding.  No midline pulsatile mass.  No  hepatomegaly.  No splenomegaly.  SKIN:  No rashes.  No nodules.  EXTREMITIES:  Pulses 2+.  No edema.  No cyanosis.  No clubbing.  NEUROLOGIC:  Oriented to person, place, time.  Cranial nerves II-XII  grossly intact.  Motor  grossly intact.   ASSESSMENT/PLAN:  Chest discomfort.  The patient is no longer having  chest discomfort.  This is probably pleuritic and maybe a mild  pericarditis, however, it resolved.  She has had no abnormalities on  stress perfusion imaging.  At this point, I would follow up the only  finding which was the very small pericardial effusion clinically.  If  she has any recurrent chest discomfort or shortness of breath, fevers,  chills, or other symptoms, I would have a low threshold for repeat  echocardiogram.   FOLLOWUP:  She can come back and see [place] as needed.     Rollene Rotunda, MD, [employer]  Electronically Signed    JH/MedQ  DD: 05/18/2007  DT: 05/18/2007  Job #: [id number]   cc:   Marne A. Milinda Antis, MD

## 2011-03-25 NOTE — Op Note (Signed)
NAMENIL, BOLSER               ACCOUNT NO.:  [id number]   MEDICAL RECORD NO.:  [id number]          PATIENT TYPE:  AMB   LOCATION:  DSC                          FACILITY:  [facility]   PHYSICIAN:  Rose Phi. Maple Hudson, M.D.   DATE OF BIRTH:  07-23-1933   DATE OF PROCEDURE:  DATE OF DISCHARGE:                               OPERATIVE REPORT   Audio too short to transcribe (less than 5 seconds)      Rose Phi. Maple Hudson, M.D.     PRY/MEDQ  D:  12/13/2006  T:  12/13/2006  Job:  [id number]

## 2011-03-25 NOTE — H&P (Signed)
NAME:  Deborah Espinoza, Deborah Espinoza                         ACCOUNT NO.:  [id number]   MEDICAL RECORD NO.:  [id number]                   PATIENT TYPE:  INP   LOCATION:  NA                                   FACILITY:  [facility]   PHYSICIAN:  Gretta Cool, M.D.              DATE OF BIRTH:  [birth year]   DATE OF ADMISSION:  DATE OF DISCHARGE:                                HISTORY & PHYSICAL   ANTICIPATED DATE OF ADMISSION:  September 24, 2003   CHIEF COMPLAINT:  Adnexal mass.   HISTORY OF PRESENT ILLNESS:  A 77 year old gravida 4, para 3 under the  primary care of Dr. Joselyn Glassman.  She has a history of a hysterectomy in 1967 by  W. Christie Beckers, M.D.  He did not remove her ovaries.  She has been on no  hormone replacement therapy in recent years.  She has a long history of LS&A  on clobetasol.  On recent physical exam, August 25, 2003, she was noted to  have an adnexal mass.  Ultrasound examination of her abdomen confirmed a  large simple-looking cystic structure measuring 9.3 x 4.9 cm.  Her CA-125  was measured and was slightly elevated at 38.1.  She subsequently had CT of  the abdomen which revealed a large right ovarian cystic mass.  There was  also a smaller left 1.6 cm cystic mass with punctate mural calcification  considered likely benign.  The architecture of the cystic mass was quite  simple.  On CT it measured 7.5 x 9 x 7.5 cm.  CT noted six septations in the  inferior margin.  She is now scheduled for definitive evaluation by  laparotomy, bilateral salpingo-oophorectomy, possible omentectomy and node  dissection.  De Blanch, M.D. is to standby for possible node  dissection if that is required.   PAST MEDICAL HISTORY:  Usual childhood disease without sequelae.   MEDICAL ILLNESSES:  1. Hypercholesterolemia.  2. Arthritis.   PAST SURGICAL HISTORY:  1. Appendectomy 1953.  2. T&A 1957.  3. Hysterectomy 1967, Esmeralda Arthur, M.D.   FAMILY HISTORY:  Father died at 84 of a  brain tumor.  Mother died of  pancreatic cancer at age 40.  One brother died of colon cancer.  Another  brother is diabetic.  No other known familial tendency to disease.   REVIEW OF SYSTEMS:  HEENT:  Denies symptoms.  CARDIORESPIRATORY:  Denies  asthma, cough, bronchitis, shortness of breath.  GI/GU:  Denies frequency or  urgency, dysuria, change in bowel habits, food intolerance.   PHYSICAL EXAMINATION:  GENERAL:  A generally well-developed, frail, thin  white female with significant loss in height, down approximately 2 inches.  She has some beginning dowager's hump.  HEENT:  Pupils equal, react to light and accommodation.  Fundi not examined.  Oropharynx clear.  NECK:  Supple without mass or thyroid enlargement.  BREASTS:  Soft without mass, nodes, nipple discharge.  HEART:  Regular rhythm without murmur or cardiac enlargement.  ABDOMEN:  Soft without mass or organomegaly.  PELVIC:  External genitalia normal female.  Vagina clean, rugosed.  LS&A is  well controlled.  Some thinning secondary to chronic steroid therapy.  Vagina is clean with diminished estrogen effect.  Cervix and uterus  surgically absent.  On rectovaginal exam, however, she has a large abdominal  mass predominately to the right of the midline estimated to be 9 to 10 cm in  greatest dimension.  See the ultrasound and CT for details.   IMPRESSION:  1. Large rapidly developed adnexal mass over the last several months from     her history.  2. Rule out ovarian malignancy.  3. Elevation of CA-125.   PLAN:  Exploratory laparotomy, salpingo-oophorectomy, possible omentectomy  and node dissection depending upon pathology.  De Blanch, M.D.  is standby, Dr. Delford Field to primary assist.                                               Gretta Cool, M.D.    CWL/MEDQ  D:  09/23/2003  T:  09/23/2003  Job:  [id number]   cc:   Dr. Lenard Forth, [facility]   De Blanch, M.D.

## 2011-03-25 NOTE — Op Note (Signed)
NAMEBREANA, Deborah Espinoza               ACCOUNT NO.:  [id number]   MEDICAL RECORD NO.:  [id number]          PATIENT TYPE:  AMB   LOCATION:  DSC                          FACILITY:  [facility]   PHYSICIAN:  Rose Phi. Young, M.D.   DATE OF BIRTH:  03-17-1933   DATE OF PROCEDURE:  12/13/2006  DATE OF DISCHARGE:                               OPERATIVE REPORT   PREOPERATIVE DIAGNOSIS:  Ductal carcinoma in situ of the right breast.   POSTOPERATIVE DIAGNOSIS:  Ductal carcinoma in situ of the right breast.   OPERATION:  Right partial mastectomy with needle localization and  specimen mammogram.   SURGEON:  Rose Phi. Maple Hudson, M.D.   ANESTHESIA:  MAC.   OPERATIVE PROCEDURE:  Prior to coming to the operating room, a  localizing wire had been placed in about the 12 o'clock position of the  right breast where focus of DCIS was located.   The patient was placed on the operating table with the arms extended on  the arm board and the right breast prepped and draped in the usual  fashion.  A near transverse incision was then outlined with a marking  pencil using the wire at the 12 o'clock position as a guide.  This was  then thoroughly infiltrated with a local anesthetic mixture.  Incision  was made and the wire delivered in the middle of the incision.  A wide  excision of the wire and surrounding tissue was carried out.   Hemostasis was obtained with a cautery.   Specimen and mammography confirmed the removal of the clip and the  lesion.   With good hemostasis, subcuticular closure of 4-0 Monocryl and Dermabond  was carried out.  A light dressing was then applied and the patient  transferred to the recovery room in satisfactory condition, having  tolerated the procedure well.      Rose Phi. Maple Hudson, M.D.  Electronically Signed     PRY/MEDQ  D:  12/13/2006  T:  12/13/2006  Job:  [id number]

## 2011-03-25 NOTE — Discharge Summary (Signed)
NAME:  Deborah Espinoza, Deborah Espinoza                         ACCOUNT NO.:  [id number]   MEDICAL RECORD NO.:  [id number]                   PATIENT TYPE:  INP   LOCATION:  0457                                 [facility]ACILITY:  [facility]   PHYSICIAN:  Gretta Cool, M.D.              DATE OF BIRTH:  Oct 06, 1933   DATE OF ADMISSION:  09/24/2003  DATE OF DISCHARGE:  09/26/2003                                 DISCHARGE SUMMARY   HISTORY OF PRESENT ILLNESS:  Deborah Espinoza is a 77 year old female gravida 4,  para 3, and is under the primary care of Dr. Joselyn Glassman.  She had a hysterectomy  in 1967 by Dr. Christie Beckers.  Her ovaries were preserved at that time.  She has been on no hormone replacement therapy in the past several years.  She has a long history of lichen sclerosis and is on clobetasol.  Recent  examination it was noted that she had an adnexal mass with ultrasound  examination confirming a simple looking cystic structure measuring 9.3 x 4.9  cm.  Her CA-125 was slightly elevated at 38.1.  Subsequent CT of the abdomen  revealed a large right ovarian cystic mass and a smaller left 1.6 cm cystic  mass with punctated mural calcification considered benign.  The architecture  of the larger mass was simple measuring 7.5 x 9 x 7.5 cm.  She is now  scheduled for definitive evaluation by laparotomy, bilateral salpingo-  oophorectomy, possible omentectomy and node dissection with Dr. Marcene Corning-  Pearson on standby.   PHYSICAL EXAMINATION:  CHEST:  Clear to A&P.  HEART:  Rate and rhythm were regular without murmur, gallop, or cardiac  enlargement.  ABDOMEN:  Soft and scaphoid without masses or organomegaly.  PELVIC:  External genitalia within normal limits for female.  Vagina is  clean and rugose.  __________ is well controlled.  Some thinning secondary  to chronic steroid therapy.  The vagina is clean with diminished estrogen  effect.  The cervix and uterus are surgically absent.  Rectovaginal  examination reveals a  large abdominal mass predominantly to the right of the  midline estimated to be 9-10 cm in its greatest dimension.   IMPRESSION:  1. Large, rapidly developed adnexal mass over the last several months.  2. Rule out ovarian malignancy.  3. Elevated CA-125.  4. Arthritis.  5. Hypercholesterolemia.   PLAN:  Exploratory laparotomy, salpingo-oophorectomy, and possible  omentectomy and node dissection under general anesthesia.  Dr. Marcene Corning-  Pearson is on standby.  Dr. Delford Field to primary assist.   ADMISSION LABORATORIES:  Hemoglobin 13.9, hematocrit 40.7, white count 6.1.  On the first postoperative day hemoglobin was 12.6, hematocrit 36.  The  remaining of her preoperative laboratory work was within normal limits with  the exception of a slightly low sodium of 131 and a slightly high calcium of  10.8.  Blood type was O+ with a negative antibody screen.  Her EKG:  Normal  sinus rhythm.   HOSPITAL COURSE:  The patient underwent exploratory laparotomy, bilateral  salpingo-oophorectomy under general anesthesia.  Washings were obtained upon  entering the peritoneal cavity.  There was no evidence of disease on the  omentum, the diaphragm, the liver surface or pelvic surfaces.  External  surfaces of the artery were smooth except for one nodule on the inferior  aspect that was hard and thickened.  The ovaries transluminated easily.  There were adhesions on the right that were lysed as well as all adhesions  released from the cecum.  Pathology consult and frozen section revealed a  benign ovarian tumor.  At this point the procedure was terminated without  any complications and the patient was returned to the recovery room in  excellent condition.  Pathology report:  Right adnexal mass, benign ovarian  serous cystoadenoma.  Left ovary, fallopian tubes benign serous  cystoadenoma.  No evidence of borderline change or malignancy.  Her  postoperative course was without complications and she was  discharged on the  second postoperative day in excellent condition.   FINAL DISCHARGE INSTRUCTIONS:  No heavy lifting or straining.  No vaginal  entrance.  Increase ambulation as tolerated.  She is to call for any fever  of over 100.5 or failure of daily improvement.   DIET:  Regular.   MEDICATIONS:  1. Tylox one to two tablets q.2-4h. p.r.n. discomfort.  2. Bextra 10 mg daily.   FOLLOWUP:  She is to return to the office in one week for follow-up.   CONDITION ON DISCHARGE:  Excellent.   FINAL DISCHARGE DIAGNOSES:  1. Benign ovarian neoplasm.  2. Hypercholesterolemia.  3. Arthritis.   PROCEDURE PERFORMED:  Exploratory laparotomy, bilateral salpingo-  oophorectomy under general anesthesia.     Matt Holmes, N.P.                          Gretta Cool, M.D.    EMK/MEDQ  D:  10/27/2003  T:  10/27/2003  Job:  [id number]   cc:   De Blanch, M.D.   Marne A. Milinda Antis, M.D. [facility]

## 2011-03-25 NOTE — Op Note (Signed)
NAME:  Deborah Espinoza, TABAK                         ACCOUNT NO.:  [id number]   MEDICAL RECORD NO.:  [id number]                   PATIENT TYPE:  INP   LOCATION:  0457                                 FACILITY:  [facility]   PHYSICIAN:  Gretta Cool, M.D.              DATE OF BIRTH:  1933-01-06   DATE OF PROCEDURE:  09/24/2003  DATE OF DISCHARGE:                                 OPERATIVE REPORT   PREOPERATIVE DIAGNOSIS:  A 10-cm right adnexal mass suspicious of ovarian  malignancy.   POSTOPERATIVE DIAGNOSIS:  Benign ovarian neoplasm.   SURGEON:  Gretta Cool, M.D.   ASSISTANT:  Raynald Kemp, M.D.   ANESTHESIA:  General orotracheal.   DESCRIPTION OF PROCEDURE:  Under satisfactory general anesthesia, the  patient's abdomen was prepped and draped, and placed in Allen stirrups in  supine position. With a Foley catheter draining her bladder, a vertical skin  incision was made by excision of her previous vertical scar. The incision  was then extended through the fascia. The rectus muscles were then separated  in the midline and peritoneum opened. Upon entering the peritoneal cavity,  washings were obtained.  Careful examination of the peritoneal cavity was  then made and there was no evidence of disease involving the omentum, the  diaphragm surface, liver surface, and pelvic surfaces. The external surfaces  of the artery was smooth except for one nodule on the inferior aspect that  was hard and thickened. The ovary transilluminated easily and was at least  10 cm in size in greatest dimension. Examination of the contralateral ovary  revealed a 1.5 cm or so size cyst. On the right the adhesions were lysed and  the adhesions released from the cecum, lateral pelvic wall, and a bladder  flap. The infundibulopelvic vessels were then isolated from the ureter,  clamped, cut, sutured, and tied with 0Vicryl. The ovary was then excised and  submitted to the pathologist for path consult. The left  ovary was also  incised in a similar fashion. At this point the pelvic floor was re-  peritonealized with a running suture of 2-0 Monocryl. Pathology consults and  frozen section revealed a benign ovarian tumor. At this point the procedure  was terminated without complications.  The pelvis was irrigated, sponge and  packs were removed. Retractors were also removed. The abdominal peritoneum  was closed with a running suture of 2-0 Monocryl. The fascia was then  approximated with a running mattress suture of 0 Vicryl. A subcutaneous  tissue was  approximated with interrupted sutures of 3-0 Vicryl and the skin was closed  with skin staples and Steri-Strips.  At the end of the procedure, sponge,  needle, and lap counts were correct. There were no complications. The  patient returned to the recovery room in excellent condition.  Gretta Cool, M.D.    CWL/MEDQ  D:  09/24/2003  T:  09/24/2003  Job:  [id number]   cc:   Jonny Ruiz T. Kyla Balzarine, M.D.   Marne A. Milinda Antis, M.D. [facility]   Marvia Pickles Dr. Sharol Given Dr.

## 2011-03-29 ENCOUNTER — Encounter (INDEPENDENT_AMBULATORY_CARE_PROVIDER_SITE_OTHER): Payer: Self-pay | Admitting: Surgery

## 2011-08-16 ENCOUNTER — Other Ambulatory Visit: Payer: Self-pay | Admitting: *Deleted

## 2011-08-16 MED ORDER — ALENDRONATE SODIUM 70 MG PO TABS: ORAL_TABLET | ORAL | Status: DC

## 2011-08-24 LAB — COMPREHENSIVE METABOLIC PANEL WITH GFR
ALT: 21
AST: 23
Albumin: 3.8
Alkaline Phosphatase: 124 — ABNORMAL HIGH
BUN: 7
CO2: 27
Calcium: 10.1
Chloride: 100
Creatinine, Ser: 0.57
GFR calc Af Amer: >60
GFR calc non Af Amer: >60
Glucose, Bld: 107 — ABNORMAL HIGH
Potassium: 4.5
Sodium: 135
Total Bilirubin: 0.8
Total Protein: 7.2

## 2011-08-24 LAB — CARDIAC PANEL(CRET KIN+CKTOT+MB+TROPI)
CK, MB: 1.6
Relative Index: INVALID
Relative Index: INVALID
Total CK: 50

## 2011-08-24 LAB — TROPONIN I: Troponin I: 0.02

## 2011-08-24 LAB — CBC
HCT: 44.2
Hemoglobin: 15.1 — ABNORMAL HIGH
MCV: 90.1
Platelets: 271
RBC: 4.9
WBC: 9

## 2011-08-24 LAB — I-STAT 8, (EC8 V) (CONVERTED LAB)
Acid-Base Excess: 4 — ABNORMAL HIGH
BUN: 8
Bicarbonate: 30.6 — ABNORMAL HIGH
Chloride: 104
Glucose, Bld: 99
HCT: 47 — ABNORMAL HIGH
Hemoglobin: 16 — ABNORMAL HIGH
Operator id: 282751
Potassium: 4.6
Sodium: 136
TCO2: 32
pCO2, Ven: 51 — ABNORMAL HIGH
pH, Ven: 7.386 — ABNORMAL HIGH

## 2011-08-24 LAB — CK TOTAL AND CKMB (NOT AT ARMC): CK, MB: 1.8

## 2011-08-24 LAB — TSH: TSH: 0.971

## 2011-08-24 LAB — POCT CARDIAC MARKERS: Troponin i, poc: <0.05

## 2011-08-30 ENCOUNTER — Ambulatory Visit (INDEPENDENT_AMBULATORY_CARE_PROVIDER_SITE_OTHER): Payer: MEDICARE | Admitting: Family Medicine

## 2011-08-30 ENCOUNTER — Encounter: Payer: Self-pay | Admitting: Family Medicine

## 2011-08-30 VITALS — BP 130/84 | HR 80 | Temp 97.5°F | Ht 61.5 in | Wt 105.0 lb

## 2011-08-30 DIAGNOSIS — M79604 Pain in right leg: Secondary | ICD-10-CM

## 2011-08-30 DIAGNOSIS — M79605 Pain in left leg: Secondary | ICD-10-CM

## 2011-08-30 DIAGNOSIS — R200 Anesthesia of skin: Secondary | ICD-10-CM

## 2011-08-30 DIAGNOSIS — R209 Unspecified disturbances of skin sensation: Secondary | ICD-10-CM

## 2011-08-30 DIAGNOSIS — M79609 Pain in unspecified limb: Secondary | ICD-10-CM

## 2011-08-30 NOTE — Patient Instructions (Signed)
Recheck with Dr. Milinda Antis for check-up in the next 4-6 weeks

## 2011-08-30 NOTE — Progress Notes (Signed)
  Subjective:    Patient ID: Deborah Espinoza, female    DOB: Jul 15, 1933, 77 y.o.   MRN: [id number]  HPI  Deborah Espinoza, a 77 y.o. female presents today in the office for the following:    Legs has been getting numb for about a month -- will come on at any time. 15 or 20 minutes. Possibly with walking and activity. Relatively poor historian. O/w she is feeling OK. No trauma or injury. No back pain. No radiculopathy. No buttocks pain. No "electric-like" pain.  The PMH, PSH, Social History, Family History, Medications, and allergies have been reviewed in [facility], and have been updated if relevant.   Review of Systems ROS: GEN: No acute illnesses, no fevers, chills. GI: No n/v/d, eating normally Pulm: No SOB Interactive and getting along well at home.  Otherwise, ROS is as per the HPI.     Objective:   Physical Exam   Physical Exam  Blood pressure 130/84, pulse 80, temperature 97.5 F (36.4 C), temperature source Oral, height 5' 1.5" (1.562 m), weight 105 lb (47.628 kg), SpO2 99.00%.  GEN: WDWN, NAD, Non-toxic, A & O x 3 HEENT: Atraumatic, Normocephalic. Neck supple. No masses, No LAD. Ears and Nose: No external deformity. CV: RRR, No M/G/R. No JVD. No thrill. No extra heart sounds. PULM: CTA B, no wheezes, crackles, rhonchi. No retractions. No resp. distress. No accessory muscle use. EXTR: No c/c/e, decreased DP pulses. NEURO Normal gait. DTR 2+, sensation and str intact LE. No clonus.  PSYCH: Normally interactive. Conversant. Not depressed or anxious appearing.  Calm demeanor.       Assessment & Plan:   1. Leg pain, bilateral  Lower Extremity Arterial Duplex Bilateral  2. Leg numbness  Lower Extremity Arterial Duplex Bilateral    No real history to suggest spinal root compromise. I think the first step is to evaluate her vascular status with ABI's.  F/u with PCP for routine 6 mo f/u in 1 mo

## 2011-09-06 ENCOUNTER — Encounter (INDEPENDENT_AMBULATORY_CARE_PROVIDER_SITE_OTHER): Payer: MEDICARE | Admitting: *Deleted

## 2011-09-06 DIAGNOSIS — M79604 Pain in right leg: Secondary | ICD-10-CM

## 2011-09-06 DIAGNOSIS — I739 Peripheral vascular disease, unspecified: Secondary | ICD-10-CM

## 2011-09-06 DIAGNOSIS — R200 Anesthesia of skin: Secondary | ICD-10-CM

## 2011-09-26 ENCOUNTER — Other Ambulatory Visit: Payer: Self-pay | Admitting: Family Medicine

## 2011-09-27 ENCOUNTER — Encounter: Payer: Self-pay | Admitting: Family Medicine

## 2011-09-27 ENCOUNTER — Ambulatory Visit (INDEPENDENT_AMBULATORY_CARE_PROVIDER_SITE_OTHER): Payer: MEDICARE | Admitting: Family Medicine

## 2011-09-27 VITALS — BP 140/80 | HR 76 | Temp 97.5°F | Ht 61.5 in | Wt 101.8 lb

## 2011-09-27 DIAGNOSIS — E782 Mixed hyperlipidemia: Secondary | ICD-10-CM

## 2011-09-27 DIAGNOSIS — G589 Mononeuropathy, unspecified: Secondary | ICD-10-CM

## 2011-09-27 DIAGNOSIS — G629 Polyneuropathy, unspecified: Secondary | ICD-10-CM

## 2011-09-27 DIAGNOSIS — Z23 Encounter for immunization: Secondary | ICD-10-CM

## 2011-09-27 DIAGNOSIS — I10 Essential (primary) hypertension: Secondary | ICD-10-CM

## 2011-09-27 DIAGNOSIS — Z79899 Other long term (current) drug therapy: Secondary | ICD-10-CM

## 2011-09-27 LAB — CBC WITH DIFFERENTIAL/PLATELET
Basophils Relative: 0.4 % (ref 0.0–3.0)
Eosinophils Absolute: 0.2 10*3/uL (ref 0.0–0.7)
Hemoglobin: 15.1 g/dL — ABNORMAL HIGH (ref 12.0–15.0)
MCHC: 33.7 g/dL (ref 30.0–36.0)
MCV: 92.6 fl (ref 78.0–100.0)
Monocytes Absolute: 0.6 10*3/uL (ref 0.1–1.0)
Neutro Abs: 5.5 10*3/uL (ref 1.4–7.7)
RBC: 4.82 Mil/uL (ref 3.87–5.11)
RDW: 13.5 % (ref 11.5–14.6)

## 2011-09-27 LAB — COMPREHENSIVE METABOLIC PANEL
AST: 25 U/L (ref 0–37)
Alkaline Phosphatase: 95 U/L (ref 39–117)
BUN: 12 mg/dL (ref 6–23)
Creatinine, Ser: 0.7 mg/dL (ref 0.4–1.2)
Total Bilirubin: 0.6 mg/dL (ref 0.3–1.2)

## 2011-09-27 LAB — LIPID PANEL
Cholesterol: 191 mg/dL (ref 0–200)
HDL: 63.7 mg/dL (ref >39.00)
LDL Cholesterol: 102 mg/dL — ABNORMAL HIGH (ref 0–99)
Triglycerides: 126 mg/dL (ref 0.0–149.0)
VLDL: 25.2 mg/dL (ref 0.0–40.0)

## 2011-09-27 MED ORDER — ALENDRONATE SODIUM 70 MG PO TABS: ORAL_TABLET | ORAL | Status: DC

## 2011-09-27 MED ORDER — AMLODIPINE BESYLATE 5 MG PO TABS: 5.0000 mg | ORAL_TABLET | Freq: Every day | ORAL | Status: DC

## 2011-09-27 MED ORDER — DONEPEZIL HCL 5 MG PO TABS: 5.0000 mg | ORAL_TABLET | Freq: Every evening | ORAL | Status: DC | PRN

## 2011-09-27 MED ORDER — FLUTICASONE PROPIONATE 50 MCG/ACT NA SUSP: 2.0000 | Freq: Every day | NASAL | Status: DC

## 2011-09-27 NOTE — Progress Notes (Signed)
Subjective:    Patient ID: Deborah Espinoza, female    DOB: 04/24/1933, 77 y.o.   MRN: [id number]  HPI Here for f/u of chronic medical problems incl HTN and lipids and leg pain She is doing ok overall  Still having some numbness down from knees to feet  Just numb- not hurting or burning No back pain   Does not keep her up at night   Wt is down 4 lb with bmi of 18 Has not had as much appetite - and eating a bit less   Saw Dr Patsy Lager for leg pain -- and did order ABIs which were normal  Thinks it may have nerve involvement / neuropathy  Due for lipid check - will do that today Diet controlled Lab Results  Component Value Date   CHOL 191 09/27/2011   HDL 63.70 09/27/2011   LDLCALC 102* 09/27/2011   LDLDIRECT 154.4 08/30/2007   TRIG 126.0 09/27/2011   CHOLHDL 3 09/27/2011   is difficult to watch sat fats in diet without loosing weight    HTN - bp is 160/80 - she did take her med this am  On norvasc 5 mg  This is up  Better on 2nd check today No cp or ha Also due for HTN labs   Is taking the fosamax for bones once per week- no side effects or problems  Also ca and vit D   Flu shot - needs that today  Also interested in shingles vaccine - pt's husb just had shingles   Patient Active Problem List  Diagnoses  . HYPERLIPIDEMIA  . DECREASED HEARING  . HYPERTENSION, ESSENTIAL NOS  . VARICOSE VEINS LOWER EXTREMITIES W/OTH COMPS  . HEMORRHOIDS, EXTERNAL  . ALLERGIC RHINITIS  . CONSTIPATION  . CONSTIPATION, CHRONIC  . LICHEN NOS  . URTICARIA  . OSTEOPOROSIS  . VERTIGO  . MEMORY LOSS  . PERIPHERAL EDEMA  . COLONIC POLYPS, HX OF  . NECK SPRAIN AND STRAIN  . Neuropathy   Past Medical History  Diagnosis Date  . Allergic rhinitis, cause unspecified   . Personal history of colonic polyps   . Unspecified constipation   . Other constipation   . Unspecified hearing loss   . Elevated blood pressure reading without diagnosis of hypertension   . External hemorrhoids  without mention of complication   . Mixed hyperlipidemia   . Unspecified essential hypertension   . Lichen, unspecified     sclerosis  . Memory loss   . Sprain of neck   . Osteoporosis, unspecified   . Edema   . Urticaria, unspecified   . Varicose veins of lower extremities with other complications   . Dizziness and giddiness   . GERD (gastroesophageal reflux disease)   . Breast cancer     right  . Cataracts, bilateral     complications from eye surgery   Past Surgical History  Procedure Date  . Stress perfusion cardiac study 7/22008    Normal with normal EF  . Dexa 2001    Osteopenia  . Partial hysterectomy 1987    fibroids  . Colonoscopy 11/02    polyp  . Dexa     improved OP  . Cataract extraction 04/2003  . Ovarian tumor 11/04    benign  . Bilateral oophorectomy     bilateral  . Colonoscopy 09/2004    polyps  . Dexa 11/2004    OP  . Breast biopsy 12/07    pos ductal carcinoma in situ  .  Nuclear stress 05/2007    negative  . Colonoscopy 11/08    adenomatous polyps  . Dexa 2/09    OP T score-3.0 spine and -2.5 in fem neck  . Breast lumpectomy 2008    RIGHT   History  Substance Use Topics  . Smoking status: Never Smoker   . Smokeless tobacco: Not on file  . Alcohol Use: No   Family History  Problem Relation Age of Onset  . Brain cancer Father   . Diabetes Father   . Kidney cancer Mother     renal cell  . Cancer Mother   . Diabetes Brother   . Diabetes Brother   . Colon cancer Brother   . Alzheimer's disease      Aunt   Allergies  Allergen Reactions  . Alendronate Sodium     REACTION: can't afford  . Cephalexin   . Clarithromycin   . Codeine   . Hydrocodone   . Penicillins   . Risedronate Sodium    Current Outpatient Prescriptions on File Prior to Visit  Medication Sig Dispense Refill  . cholecalciferol (VITAMIN D) 1000 UNITS tablet Take 1,000 Units by mouth daily.        . clobetasol (TEMOVATE) 0.05 % cream Apply 1 application topically  2 (two) times daily.        . Calcium Carbonate-Vitamin D (CALTRATE 600+D) 600-400 MG-UNIT per tablet Take 1 tablet by mouth 2 (two) times daily.        . meclizine (ANTIVERT) 25 MG tablet Take 25 mg by mouth 3 (three) times daily as needed.        . NON FORMULARY Support hose to thigh 15 mm/hg              Review of Systems Review of Systems  Constitutional: Negative for fever, appetite change, fatigue and unexpected weight change.  Eyes: Negative for pain and visual disturbance.  Respiratory: Negative for cough and shortness of breath.   Cardiovascular: Negative for cp or palpitations    Gastrointestinal: Negative for nausea, diarrhea and constipation.  Genitourinary: Negative for urgency and frequency.  Skin: Negative for pallor or rash   Neurological: Negative for weakness or headaches/ pos for numbness of feet , also occasional vertigo (none today) Hematological: Negative for adenopathy. Does not bruise/bleed easily.  Psychiatric/Behavioral: Negative for dysphoric mood. The patient is not nervous/anxious.          Objective:   Physical Exam  Constitutional: She appears well-developed and well-nourished. No distress.  HENT:  Head: Normocephalic and atraumatic.  Mouth/Throat: Oropharynx is clear and moist.  Eyes: Conjunctivae and EOM are normal. Pupils are equal, round, and reactive to light. No scleral icterus.  Neck: Normal range of motion. Neck supple. No JVD present. Carotid bruit is not present. No thyromegaly present.  Cardiovascular: Normal rate, regular rhythm and intact distal pulses.  Exam reveals no gallop.   Pulmonary/Chest: Effort normal and breath sounds normal. No respiratory distress. She has no wheezes.  Abdominal: Soft. Bowel sounds are normal. She exhibits no distension, no abdominal bruit and no mass. There is no tenderness.  Musculoskeletal: Normal range of motion. She exhibits no edema and no tenderness.       Varicosities noted   Lymphadenopathy:     She has no cervical adenopathy.  Neurological: She is alert. She has normal strength and normal reflexes. She displays no atrophy. No cranial nerve deficit. She exhibits normal muscle tone. Coordination and gait normal.  Nl sensation for this exam - could feel temperature/ sharp and monofilament in feet and lower legs Nl gait   Skin: Skin is warm and dry. No rash noted. No erythema.  Psychiatric: She has a normal mood and affect.       Very quiet and timid           Assessment & Plan:

## 2011-09-27 NOTE — Patient Instructions (Signed)
Flu shot today  If you are interested in a shingles/zoster vaccine - call your insurance to check on coverage,( you should not get it within 1 month of other vaccines) , then call us for a prescription  for it to take to a pharmacy that gives the shot   if you cannot afford it - check with the health dept Labs today for neuropathy (what I think is what you have in your legs ) Also labs for cholesterol / hypertension  Will advise you further when labs return - I may want to refer you to neurology for your feet

## 2011-09-27 NOTE — Assessment & Plan Note (Signed)
bp better on 2nd check -- 140/80  No change in meds They were refilled Lab today

## 2011-09-27 NOTE — Assessment & Plan Note (Signed)
In legs and feet- ? Intermittent Handout given ? etiol Labs today If all neg - ref to neurology Nl exam today Rev nl ABI tests

## 2011-09-27 NOTE — Assessment & Plan Note (Signed)
Disc goals for lipids and reasons to control them Rev labs with pt Rev low sat fat diet in detail  Labs today and update

## 2011-10-05 ENCOUNTER — Other Ambulatory Visit: Payer: Self-pay | Admitting: Obstetrics

## 2011-10-05 ENCOUNTER — Other Ambulatory Visit (INDEPENDENT_AMBULATORY_CARE_PROVIDER_SITE_OTHER): Payer: Self-pay | Admitting: Surgery

## 2011-10-05 DIAGNOSIS — Z853 Personal history of malignant neoplasm of breast: Secondary | ICD-10-CM

## 2011-10-17 ENCOUNTER — Ambulatory Visit
Admission: RE | Admit: 2011-10-17 | Discharge: 2011-10-17 | Disposition: A | Discharge status: Home / Self Care | Payer: MEDICARE | Source: Ambulatory Visit | Attending: Obstetrics | Admitting: Obstetrics

## 2011-10-17 DIAGNOSIS — Z853 Personal history of malignant neoplasm of breast: Secondary | ICD-10-CM

## 2011-10-30 NOTE — Progress Notes (Signed)
Ok- will do referral if they call back/ symptoms worsen

## 2011-11-28 ENCOUNTER — Encounter (INDEPENDENT_AMBULATORY_CARE_PROVIDER_SITE_OTHER): Payer: Self-pay | Admitting: General Surgery

## 2011-11-28 DIAGNOSIS — Z853 Personal history of malignant neoplasm of breast: Secondary | ICD-10-CM | POA: Insufficient documentation

## 2011-11-29 ENCOUNTER — Encounter (INDEPENDENT_AMBULATORY_CARE_PROVIDER_SITE_OTHER): Payer: Self-pay | Admitting: Surgery

## 2011-11-29 ENCOUNTER — Ambulatory Visit (INDEPENDENT_AMBULATORY_CARE_PROVIDER_SITE_OTHER): Payer: MEDICARE | Admitting: Surgery

## 2011-11-29 VITALS — BP 110/70 | HR 60 | Resp 16 | Ht 62.0 in | Wt 100.0 lb

## 2011-11-29 DIAGNOSIS — Z853 Personal history of malignant neoplasm of breast: Secondary | ICD-10-CM

## 2011-11-29 NOTE — Patient Instructions (Signed)
We will see you again on an as needed basis. Please call the office at [phone] if you have any questions or concerns. Thank you for allowing us to take care of you.  

## 2011-11-29 NOTE — Progress Notes (Signed)
NAME: Deborah Espinoza       DOB: [birth year]           DATE: 11/29/2011       MRN: [id number]   Deborah Espinoza is a 78 y.o.Marland Kitchenfemale who presents for routine followup of her DCIS right breast diagnosed in 2008 and treated with lumpectomy, no radiation. She has no problems or concerns on either side.  PFSH: She recently fell and sustained wrist and clavicle Fx, recovering slowly  ROS: There have been no significant changes since the last visit here  EXAM: General: The patient is alert, oriented, generally healty appearing, NAD. Mood and affect are normal.  Breasts:  The right breast has a little bit of loss of tissue at the lumpectomy site but otherwise is completely normal. There is no suggestion of a new mass for recurrence. The left breast is completely normal.  Lymphatics: She has no axillary or supraclavicular adenopathy on either side.  Extremities: Full ROM of the surgical side with no lymphedema noted.  Data Reviewed: Mammogram negative  Impression: Doing well, with no evidence of recurrent cancer or new cancer  Plan:RTC PRN

## 2011-12-20 ENCOUNTER — Telehealth: Payer: Self-pay | Admitting: Family Medicine

## 2011-12-20 MED ORDER — ZOSTER VACCINE LIVE 19400 UNT/0.65ML ~~LOC~~ SOLR: 0.6500 mL | Freq: Once | SUBCUTANEOUS | Status: AC

## 2011-12-20 NOTE — Telephone Encounter (Signed)
Medication phoned to [facility] pharmacy as instructed.Patient and pts husband notified as instructed by telephone.

## 2011-12-20 NOTE — Telephone Encounter (Signed)
Px written for call in   

## 2011-12-20 NOTE — Telephone Encounter (Signed)
Renika Shiflet would like for you to write an rx for a shingle injection and fax it to [facility] at the [address]	[place] and [address]. She knows she has to pay for it . Jazzmyne wants [place] to call her after we fax the rx.

## 2012-03-06 ENCOUNTER — Ambulatory Visit (INDEPENDENT_AMBULATORY_CARE_PROVIDER_SITE_OTHER): Payer: MEDICARE | Admitting: Family Medicine

## 2012-03-06 ENCOUNTER — Encounter: Payer: Self-pay | Admitting: Family Medicine

## 2012-03-06 VITALS — BP 144/76 | HR 68 | Temp 97.4°F | Wt 98.8 lb

## 2012-03-06 DIAGNOSIS — W57XXXA Bitten or stung by nonvenomous insect and other nonvenomous arthropods, initial encounter: Secondary | ICD-10-CM | POA: Insufficient documentation

## 2012-03-06 DIAGNOSIS — K130 Diseases of lips: Secondary | ICD-10-CM | POA: Insufficient documentation

## 2012-03-06 DIAGNOSIS — T148XXA Other injury of unspecified body region, initial encounter: Secondary | ICD-10-CM

## 2012-03-06 MED ORDER — MECLIZINE HCL 25 MG PO TABS: 25.0000 mg | ORAL_TABLET | Freq: Three times a day (TID) | ORAL | Status: DC | PRN

## 2012-03-06 NOTE — Assessment & Plan Note (Signed)
with mild edema. Anticipate chapped lips - recommend vaseline twice daily for 1-2 wks.  Update if not improved with this. No systemic sxs, no oral lesions.

## 2012-03-06 NOTE — Assessment & Plan Note (Signed)
Happened 2 wks ago. Unsure if got entire head out Discussed would recommend monitoring for now, rec against exploration. No evidence of skin infection currently. Give more time to heal.

## 2012-03-06 NOTE — Patient Instructions (Signed)
Tick bite - looking ok, would just monitor for now. For lips - looks like irritation on lips - treat with vaseline twice daily for 1-2 weeks.  Let me know if not improved after this. i've refilled meclizine. Good to see you, call us with questions

## 2012-03-06 NOTE — Progress Notes (Signed)
  Subjective:    Patient ID: Deborah Espinoza, female    DOB: [birth year], 78 y.o.   MRN: [id number]  HPI CC: several issues today  H/o inner ear problems.  Would like refill of meclizine.  Lips sore and cracked for last several months - has tried chapstick but didn't help.  Didn't have blistex at home but has used this in past.  No pruritis, no swelling.  No throat swelling, oral lesions, fevers/chills, nausea, abd pain, joint pains.  Tick bite - about 1-2 wks ago noticed tick on right side of abdomen.  Unsure how long it was attached.  Husband removed with tweezers.  Placed abx on lesion.  No rash around site but staying itchy.  No fevers/chills, abd pain, nausea.    Review of Systems Per HPI    Objective:   Physical Exam  Nursing note and vitals reviewed. Constitutional: She appears well-developed and well-nourished. No distress.  HENT:  Head: Normocephalic and atraumatic.  Mouth/Throat: Uvula is midline, oropharynx is clear and moist and mucous membranes are normal. No oropharyngeal exudate, posterior oropharyngeal edema, posterior oropharyngeal erythema or tonsillar abscesses.       No oral lesions Upper lip slightly edematous midline.  Some cracking upper lick Mild perioral erythema present as well.  Skin:       Pruritic nodule right lower abdomen       Assessment & Plan:

## 2012-07-01 ENCOUNTER — Other Ambulatory Visit: Payer: Self-pay | Admitting: Family Medicine

## 2012-08-03 ENCOUNTER — Encounter: Payer: Self-pay | Admitting: Family Medicine

## 2012-08-03 ENCOUNTER — Ambulatory Visit (INDEPENDENT_AMBULATORY_CARE_PROVIDER_SITE_OTHER): Payer: MEDICARE | Admitting: Family Medicine

## 2012-08-03 VITALS — BP 136/80 | HR 78 | Temp 97.5°F | Ht 59.0 in | Wt 98.2 lb

## 2012-08-03 DIAGNOSIS — L03032 Cellulitis of left toe: Secondary | ICD-10-CM | POA: Insufficient documentation

## 2012-08-03 DIAGNOSIS — L02619 Cutaneous abscess of unspecified foot: Secondary | ICD-10-CM

## 2012-08-03 MED ORDER — SULFAMETHOXAZOLE-TRIMETHOPRIM 800-160 MG PO TABS: 1.0000 | ORAL_TABLET | Freq: Two times a day (BID) | ORAL | Status: DC

## 2012-08-03 NOTE — Patient Instructions (Addendum)
Please send for last dermatology note from Dr Amy [place]  Use a walker to walk if you feel dizzy or unsteady  I think the redness around your toe is skin infection  Take the septra (antibiotic) as directed  (I sent this to the [facility] up the street) Elevate foot when sitting  Wash feet in antibacterial soap and water  Use a warm compress (like a heating pad on low) - for 10 minutes at a time If worse at any time- redness or swelling or fever- call  Otherwise follow up with me mid next week for a re check

## 2012-08-03 NOTE — Assessment & Plan Note (Signed)
Great toe- mild erythema and swelling surrounding nail  Also torn cuticle- likely source of infx Thickened nail- not ingrown  Will cover with septra DS and f/u next week Pt adv to call asap if any worsening

## 2012-08-03 NOTE — Progress Notes (Signed)
Subjective:    Patient ID: Deborah Espinoza, female    DOB: [birth year], 78 y.o.   MRN: [id number]  HPI Here with redness and swelling of her big toe - for a few weeks L foot  A little on the other side too   Shoes hurt/ sheet in bed did not  Can walk on the toe    Has a rash on her abdomen that her dermatologist is treating  In problem list is dx of lichen NOS Sees Dr Amy [place] Using cream- clobetasol cream   Patient Active Problem List  Diagnosis  . HYPERLIPIDEMIA  . DECREASED HEARING  . HYPERTENSION, ESSENTIAL NOS  . VARICOSE VEINS LOWER EXTREMITIES W/OTH COMPS  . HEMORRHOIDS, EXTERNAL  . ALLERGIC RHINITIS  . CONSTIPATION  . CONSTIPATION, CHRONIC  . LICHEN NOS  . URTICARIA  . OSTEOPOROSIS  . VERTIGO  . MEMORY LOSS  . PERIPHERAL EDEMA  . COLONIC POLYPS, HX OF  . NECK SPRAIN AND STRAIN  . Neuropathy  . hx: breast cancer, right, DCIS, receptor +  . Tick bite  . Rash on lips  . Cellulitis of toe of left foot   Past Medical History  Diagnosis Date  . Allergic rhinitis, cause unspecified   . Personal history of colonic polyps   . Unspecified constipation   . Other constipation   . Unspecified hearing loss   . Elevated blood pressure reading without diagnosis of hypertension   . External hemorrhoids without mention of complication   . Mixed hyperlipidemia   . Unspecified essential hypertension   . Lichen, unspecified     sclerosis  . Memory loss   . Sprain of neck   . Osteoporosis, unspecified   . Edema   . Urticaria, unspecified   . Varicose veins of lower extremities with other complications   . Dizziness and giddiness   . GERD (gastroesophageal reflux disease)   . Breast cancer     right  . Cataracts, bilateral     complications from eye surgery   Past Surgical History  Procedure Date  . Stress perfusion cardiac study 7/22008    Normal with normal EF  . Dexa 2001    Osteopenia  . Partial hysterectomy 1987    fibroids  . Colonoscopy 11/02   polyp  . Dexa     improved OP  . Cataract extraction 04/2003  . Ovarian tumor 11/04    benign  . Bilateral oophorectomy     bilateral  . Colonoscopy 09/2004    polyps  . Dexa 11/2004    OP  . Breast biopsy 12/07    pos ductal carcinoma in situ  . Nuclear stress 05/2007    negative  . Colonoscopy 11/08    adenomatous polyps  . Dexa 2/09    OP T score-3.0 spine and -2.5 in fem neck  . Breast lumpectomy 2008    RIGHT   History  Substance Use Topics  . Smoking status: Never Smoker   . Smokeless tobacco: Not on file  . Alcohol Use: No   Family History  Problem Relation Age of Onset  . Brain cancer Father   . Diabetes Father   . Kidney cancer Mother     renal cell  . Cancer Mother   . Diabetes Brother   . Diabetes Brother   . Colon cancer Brother   . Alzheimer's disease      Aunt   Allergies  Allergen Reactions  . Cephalexin   . Clarithromycin   .  Codeine   . Hydrocodone   . Penicillins   . Risedronate Sodium    Current Outpatient Prescriptions on File Prior to Visit  Medication Sig Dispense Refill  . alendronate (FOSAMAX) 70 MG tablet 1 by mouth every week as directed  4 tablet  11  . amLODipine (NORVASC) 5 MG tablet Take 1 tablet (5 mg total) by mouth daily.  30 tablet  11  . Calcium Carbonate-Vitamin D (CALTRATE 600+D) 600-400 MG-UNIT per tablet Take 1 tablet by mouth 2 (two) times daily.        . clobetasol (TEMOVATE) 0.05 % cream Apply 1 application topically 2 (two) times daily.        . fluticasone (FLONASE) 50 MCG/ACT nasal spray Place 2 sprays into the nose daily.  16 g  11  . meclizine (ANTIVERT) 25 MG tablet TAKE 1 TABLET (25 MG TOTAL) BY MOUTH 3 (THREE) TIMES DAILY AS NEEDED.  30 tablet  0  . NON FORMULARY Support hose to thigh 15 mm/hg       . cholecalciferol (VITAMIN D) 1000 UNITS tablet Take 1,000 Units by mouth daily.            Review of Systems Review of Systems  Constitutional: Negative for fever, appetite change, fatigue and unexpected  weight change.  Eyes: Negative for pain and visual disturbance.  Respiratory: Negative for cough and shortness of breath.   Cardiovascular: Negative for cp or palpitations    Gastrointestinal: Negative for nausea, diarrhea and constipation.  Genitourinary: Negative for urgency and frequency.  Skin: Negative for pallor or rash  pos for redness of toe Neurological: Negative for weakness, light-headedness, numbness and headaches.  Hematological: Negative for adenopathy. Does not bruise/bleed easily.  Psychiatric/Behavioral: Negative for dysphoric mood. The patient is not nervous/anxious.         Objective:   Physical Exam  Constitutional: She appears well-developed and well-nourished. No distress.  HENT:  Head: Normocephalic and atraumatic.  Cardiovascular: Normal rate, regular rhythm and intact distal pulses.   Pulmonary/Chest: Effort normal and breath sounds normal.  Musculoskeletal: She exhibits edema and tenderness.       Tender skin medial L great toe   Lymphadenopathy:    She has no cervical adenopathy.  Neurological: She is alert. She has normal reflexes. No sensory deficit. Gait normal.  Skin: Skin is warm and dry. No rash noted. There is erythema. No pallor.       L great toe-lateral cuticle disruption with mild redness and swelling No joint changes Mild tenderness No drainage Nail is thickened - does not appear to be ingrown          Assessment & Plan:

## 2012-08-08 ENCOUNTER — Encounter: Payer: Self-pay | Admitting: Family Medicine

## 2012-08-08 ENCOUNTER — Ambulatory Visit (INDEPENDENT_AMBULATORY_CARE_PROVIDER_SITE_OTHER): Payer: MEDICARE | Admitting: Family Medicine

## 2012-08-08 VITALS — BP 134/68 | HR 74 | Temp 97.5°F | Ht 59.0 in | Wt 97.0 lb

## 2012-08-08 DIAGNOSIS — L03039 Cellulitis of unspecified toe: Secondary | ICD-10-CM

## 2012-08-08 DIAGNOSIS — L03032 Cellulitis of left toe: Secondary | ICD-10-CM

## 2012-08-08 DIAGNOSIS — Z23 Encounter for immunization: Secondary | ICD-10-CM

## 2012-08-08 NOTE — Assessment & Plan Note (Signed)
Improved somewhat with septra and soaks Nail is very thickened - starting to become ingrown laterally  Will ref to podiatry  Finish abx Soaks if helpful

## 2012-08-08 NOTE — Assessment & Plan Note (Signed)
Improved but not resolved  Think this may be paronychia 2ndary to thickened nail that is becoming laterally ingrown Will finish abx and ref to podiatry

## 2012-08-08 NOTE — Progress Notes (Signed)
Subjective:    Patient ID: Deborah Espinoza, female    DOB: [birth year], 78 y.o.   MRN: [id number]  HPI Here for f/u of toe infection -- ? Paronychia  Is improved but not gone  Still hurts at times Redness is less Is not draining at all  Taking septra ds -no side effects  No fever Feels ok   Patient Active Problem List  Diagnosis  . HYPERLIPIDEMIA  . DECREASED HEARING  . HYPERTENSION, ESSENTIAL NOS  . VARICOSE VEINS LOWER EXTREMITIES W/OTH COMPS  . HEMORRHOIDS, EXTERNAL  . ALLERGIC RHINITIS  . CONSTIPATION  . CONSTIPATION, CHRONIC  . LICHEN NOS  . URTICARIA  . OSTEOPOROSIS  . VERTIGO  . MEMORY LOSS  . PERIPHERAL EDEMA  . COLONIC POLYPS, HX OF  . NECK SPRAIN AND STRAIN  . Neuropathy  . hx: breast cancer, right, DCIS, receptor +  . Tick bite  . Rash on lips  . Cellulitis of toe of left foot   Past Medical History  Diagnosis Date  . Allergic rhinitis, cause unspecified   . Personal history of colonic polyps   . Unspecified constipation   . Other constipation   . Unspecified hearing loss   . Elevated blood pressure reading without diagnosis of hypertension   . External hemorrhoids without mention of complication   . Mixed hyperlipidemia   . Unspecified essential hypertension   . Lichen, unspecified     sclerosis  . Memory loss   . Sprain of neck   . Osteoporosis, unspecified   . Edema   . Urticaria, unspecified   . Varicose veins of lower extremities with other complications   . Dizziness and giddiness   . GERD (gastroesophageal reflux disease)   . Breast cancer     right  . Cataracts, bilateral     complications from eye surgery   Past Surgical History  Procedure Date  . Stress perfusion cardiac study 7/22008    Normal with normal EF  . Dexa 2001    Osteopenia  . Partial hysterectomy 1987    fibroids  . Colonoscopy 11/02    polyp  . Dexa     improved OP  . Cataract extraction 04/2003  . Ovarian tumor 11/04    benign  . Bilateral oophorectomy      bilateral  . Colonoscopy 09/2004    polyps  . Dexa 11/2004    OP  . Breast biopsy 12/07    pos ductal carcinoma in situ  . Nuclear stress 05/2007    negative  . Colonoscopy 11/08    adenomatous polyps  . Dexa 2/09    OP T score-3.0 spine and -2.5 in fem neck  . Breast lumpectomy 2008    RIGHT   History  Substance Use Topics  . Smoking status: Never Smoker   . Smokeless tobacco: Not on file  . Alcohol Use: No   Family History  Problem Relation Age of Onset  . Brain cancer Father   . Diabetes Father   . Kidney cancer Mother     renal cell  . Cancer Mother   . Diabetes Brother   . Diabetes Brother   . Colon cancer Brother   . Alzheimer's disease      Aunt   Allergies  Allergen Reactions  . Cephalexin   . Clarithromycin   . Codeine   . Hydrocodone   . Penicillins   . Risedronate Sodium    Current Outpatient Prescriptions on File Prior to Visit  Medication Sig Dispense Refill  . alendronate (FOSAMAX) 70 MG tablet 1 by mouth every week as directed  4 tablet  11  . amLODipine (NORVASC) 5 MG tablet Take 1 tablet (5 mg total) by mouth daily.  30 tablet  11  . Calcium Carbonate-Vitamin D (CALTRATE 600+D) 600-400 MG-UNIT per tablet Take 1 tablet by mouth 2 (two) times daily.        . cholecalciferol (VITAMIN D) 1000 UNITS tablet Take 1,000 Units by mouth daily.        . clobetasol (TEMOVATE) 0.05 % cream Apply 1 application topically 2 (two) times daily.        . fluticasone (FLONASE) 50 MCG/ACT nasal spray Place 2 sprays into the nose daily.  16 g  11  . meclizine (ANTIVERT) 25 MG tablet TAKE 1 TABLET (25 MG TOTAL) BY MOUTH 3 (THREE) TIMES DAILY AS NEEDED.  30 tablet  0  . NON FORMULARY Support hose to thigh 15 mm/hg       . sulfamethoxazole-trimethoprim (BACTRIM DS,SEPTRA DS) 800-160 MG per tablet Take 1 tablet by mouth 2 (two) times daily.  20 tablet  0      Review of Systems Review of Systems  Constitutional: Negative for fever, appetite change, fatigue and  unexpected weight change.  Eyes: Negative for pain and visual disturbance.  Respiratory: Negative for cough and shortness of breath.   Cardiovascular: Negative for cp or palpitations    Gastrointestinal: Negative for nausea, diarrhea and constipation.  Genitourinary: Negative for urgency and frequency.  Skin: Negative for pallor or rash  pos for redness MSK pos for mild toe pain  Neurological: Negative for weakness, light-headedness, numbness and headaches.  Hematological: Negative for adenopathy. Does not bruise/bleed easily.  Psychiatric/Behavioral: Negative for dysphoric mood. The patient is not nervous/anxious.         Objective:   Physical Exam  Constitutional: She appears well-developed and well-nourished. No distress.  HENT:  Head: Normocephalic and atraumatic.  Eyes: Conjunctivae normal and EOM are normal. Pupils are equal, round, and reactive to light.  Musculoskeletal: She exhibits edema and tenderness.       L great toe- erythema proximal to nail and lateral with some scabbing  Redness is improved Mildly tender  No discharge  Nail is thickened   Neurological: She is alert.  Skin: Skin is warm and dry. There is erythema.  Psychiatric: She has a normal mood and affect.          Assessment & Plan:

## 2012-08-08 NOTE — Patient Instructions (Addendum)
I think your toe infection is improved but not gone  We will refer you to a podiatrist (foot doctor) at check out In the mean time if symptoms worsen let me know  Finish your course of antibiotic  Soak foot if it feels good and keep very clean (soap and water) Flu shot today

## 2012-09-25 ENCOUNTER — Encounter: Payer: Self-pay | Admitting: Family Medicine

## 2012-09-25 ENCOUNTER — Ambulatory Visit (INDEPENDENT_AMBULATORY_CARE_PROVIDER_SITE_OTHER): Payer: MEDICARE | Admitting: Family Medicine

## 2012-09-25 VITALS — BP 112/66 | HR 70 | Temp 97.5°F | Ht 59.0 in | Wt 103.2 lb

## 2012-09-25 DIAGNOSIS — L0291 Cutaneous abscess, unspecified: Secondary | ICD-10-CM

## 2012-09-25 DIAGNOSIS — L0390 Cellulitis, unspecified: Secondary | ICD-10-CM

## 2012-09-25 MED ORDER — SULFAMETHOXAZOLE-TRIMETHOPRIM 800-160 MG PO TABS: 1.0000 | ORAL_TABLET | Freq: Two times a day (BID) | ORAL | Status: DC

## 2012-09-25 NOTE — Progress Notes (Signed)
Subjective:    Patient ID: Deborah Espinoza, female    DOB: 20-Sep-1933, 78 y.o.   MRN: [id number]  HPI Here for swollen R knee/ leg and foot That started at least a week ago   Dr Leslee Home xrayed her knee and foot and said they were ok  Did not comment on the swelling  Recommended cold compress and elevation   Is hurting over the lower leg -- to ankle No trauma Is a little red    Has hx of varicose veins  She usually wears supp hose -but not today due to foot swelling- too hard to get on  Patient Active Problem List  Diagnosis  . HYPERLIPIDEMIA  . DECREASED HEARING  . HYPERTENSION, ESSENTIAL NOS  . VARICOSE VEINS LOWER EXTREMITIES W/OTH COMPS  . HEMORRHOIDS, EXTERNAL  . ALLERGIC RHINITIS  . CONSTIPATION  . CONSTIPATION, CHRONIC  . LICHEN NOS  . URTICARIA  . OSTEOPOROSIS  . VERTIGO  . MEMORY LOSS  . PERIPHERAL EDEMA  . COLONIC POLYPS, HX OF  . NECK SPRAIN AND STRAIN  . Neuropathy  . hx: breast cancer, right, DCIS, receptor +  . Tick bite  . Rash on lips  . Cellulitis of toe of left foot  . Paronychia of great toe of left foot   Past Medical History  Diagnosis Date  . Allergic rhinitis, cause unspecified   . Personal history of colonic polyps   . Unspecified constipation   . Other constipation   . Unspecified hearing loss   . Elevated blood pressure reading without diagnosis of hypertension   . External hemorrhoids without mention of complication   . Mixed hyperlipidemia   . Unspecified essential hypertension   . Lichen, unspecified     sclerosis  . Memory loss   . Sprain of neck   . Osteoporosis, unspecified   . Edema   . Urticaria, unspecified   . Varicose veins of lower extremities with other complications   . Dizziness and giddiness   . GERD (gastroesophageal reflux disease)   . Breast cancer     right  . Cataracts, bilateral     complications from eye surgery   Past Surgical History  Procedure Date  . Stress perfusion cardiac study 7/22008      Normal with normal EF  . Dexa 2001    Osteopenia  . Partial hysterectomy 1987    fibroids  . Colonoscopy 11/02    polyp  . Dexa     improved OP  . Cataract extraction 04/2003  . Ovarian tumor 11/04    benign  . Bilateral oophorectomy     bilateral  . Colonoscopy 09/2004    polyps  . Dexa 11/2004    OP  . Breast biopsy 12/07    pos ductal carcinoma in situ  . Nuclear stress 05/2007    negative  . Colonoscopy 11/08    adenomatous polyps  . Dexa 2/09    OP T score-3.0 spine and -2.5 in fem neck  . Breast lumpectomy 2008    RIGHT   History  Substance Use Topics  . Smoking status: Never Smoker   . Smokeless tobacco: Not on file  . Alcohol Use: No   Family History  Problem Relation Age of Onset  . Brain cancer Father   . Diabetes Father   . Kidney cancer Mother     renal cell  . Cancer Mother   . Diabetes Brother   . Diabetes Brother   . Colon cancer  Brother   . Alzheimer's disease      Aunt   Allergies  Allergen Reactions  . Cephalexin   . Clarithromycin   . Codeine   . Hydrocodone   . Penicillins   . Risedronate Sodium    Current Outpatient Prescriptions on File Prior to Visit  Medication Sig Dispense Refill  . alendronate (FOSAMAX) 70 MG tablet 1 by mouth every week as directed  4 tablet  11  . amLODipine (NORVASC) 5 MG tablet Take 1 tablet (5 mg total) by mouth daily.  30 tablet  11  . Calcium Carbonate-Vitamin D (CALTRATE 600+D) 600-400 MG-UNIT per tablet Take 1 tablet by mouth 2 (two) times daily.        . cholecalciferol (VITAMIN D) 1000 UNITS tablet Take 1,000 Units by mouth daily.        . clobetasol (TEMOVATE) 0.05 % cream Apply 1 application topically 2 (two) times daily.        . fluticasone (FLONASE) 50 MCG/ACT nasal spray Place 2 sprays into the nose daily.  16 g  11  . meclizine (ANTIVERT) 25 MG tablet TAKE 1 TABLET (25 MG TOTAL) BY MOUTH 3 (THREE) TIMES DAILY AS NEEDED.  30 tablet  0  . NON FORMULARY Support hose to thigh 15 mm/hg       .  sulfamethoxazole-trimethoprim (BACTRIM DS,SEPTRA DS) 800-160 MG per tablet Take 1 tablet by mouth 2 (two) times daily.  20 tablet  0        Review of Systems Review of Systems  Constitutional: Negative for fever, appetite change, fatigue and unexpected weight change.  Eyes: Negative for pain and visual disturbance.  Respiratory: Negative for cough and shortness of breath.   Cardiovascular: Negative for cp or palpitations    Gastrointestinal: Negative for nausea, diarrhea and constipation.  Genitourinary: Negative for urgency and frequency.  Skin: Negative for pallor or rash  pos for redness and swelling of R leg  Neurological: Negative for weakness, light-headedness, numbness and headaches.  Hematological: Negative for adenopathy. Does not bruise/bleed easily.  Psychiatric/Behavioral: Negative for dysphoric mood. The patient is not nervous/anxious.         Objective:   Physical Exam  Constitutional: She appears well-developed and well-nourished. No distress.       Elderly frail appearing female  Eyes: Conjunctivae normal and EOM are normal. Pupils are equal, round, and reactive to light.  Neck: Normal range of motion. Neck supple.  Cardiovascular: Normal rate and regular rhythm.   Pulmonary/Chest: Effort normal and breath sounds normal. No respiratory distress. She has no wheezes. She has no rales.  Musculoskeletal: She exhibits edema.       Right lower leg- swollen ankle and foot without pitting  Erythema noted with small scab on anterior ankle Mildly tender No drainage  Nl rom  No calf tenderness or palp cords  Neg homann's sign  Lymphadenopathy:    She has no cervical adenopathy.  Neurological: She is alert.  Skin: Skin is warm and dry. There is erythema. No pallor.       See [facility] exam Findings of cellulitis on R ankle and foot  Psychiatric: She has a normal mood and affect.       Baseline very timid personality          Assessment & Plan:

## 2012-09-25 NOTE — Patient Instructions (Addendum)
I think you have a skin infection (cellulitis) For this we are going to start you back on antibiotics - the septra DS Elevate foot whenever you can and use a warm compress also  If worse - ie: more swelling or redness (especially if it streaks) or pain - please let me know right away  If you run a fever- also call  Follow up Friday for a re check please

## 2012-09-25 NOTE — Assessment & Plan Note (Signed)
Of R lower leg (toenail infx L foot is better) There is a scab on anterior ankle - with redness and swelling over ankle and foot Will tx with septra Of note-many abx allergies inst to elevate and use warm comp Will call if worse F/u fri for a re check

## 2012-09-27 ENCOUNTER — Other Ambulatory Visit: Payer: Self-pay | Admitting: Obstetrics

## 2012-09-27 DIAGNOSIS — Z853 Personal history of malignant neoplasm of breast: Secondary | ICD-10-CM

## 2012-09-28 ENCOUNTER — Ambulatory Visit (INDEPENDENT_AMBULATORY_CARE_PROVIDER_SITE_OTHER): Payer: MEDICARE | Admitting: Family Medicine

## 2012-09-28 ENCOUNTER — Encounter: Payer: Self-pay | Admitting: Family Medicine

## 2012-09-28 VITALS — BP 136/79 | HR 84 | Temp 97.6°F | Ht 59.0 in | Wt 104.8 lb

## 2012-09-28 DIAGNOSIS — L0390 Cellulitis, unspecified: Secondary | ICD-10-CM

## 2012-09-28 DIAGNOSIS — L0291 Cutaneous abscess, unspecified: Secondary | ICD-10-CM

## 2012-09-28 NOTE — Assessment & Plan Note (Signed)
Slowly and gradually improving  Less red/ less swollen and tender  Will continue and finish the sulfa abx Recommend elevation and heat prn  F/u mid next week for re check or earlier if worse (pt will call if any worsening)

## 2012-09-28 NOTE — Progress Notes (Signed)
Subjective:    Patient ID: Deborah Espinoza, female    DOB: 01-21-1933, 78 y.o.   MRN: [id number]  HPI Here for f/u of cellulitis of right ankle and foot  Swelling -/ improved a bit  Less red  Had drainage one time-white  No fever  Feels fine Elevates it occasionally  No heat yet   Patient Active Problem List  Diagnosis  . HYPERLIPIDEMIA  . DECREASED HEARING  . HYPERTENSION, ESSENTIAL NOS  . VARICOSE VEINS LOWER EXTREMITIES W/OTH COMPS  . HEMORRHOIDS, EXTERNAL  . ALLERGIC RHINITIS  . CONSTIPATION  . CONSTIPATION, CHRONIC  . LICHEN NOS  . URTICARIA  . OSTEOPOROSIS  . VERTIGO  . MEMORY LOSS  . PERIPHERAL EDEMA  . COLONIC POLYPS, HX OF  . NECK SPRAIN AND STRAIN  . Neuropathy  . hx: breast cancer, right, DCIS, receptor +  . Tick bite  . Rash on lips  . Cellulitis of toe of left foot  . Paronychia of great toe of left foot  . Cellulitis   Past Medical History  Diagnosis Date  . Allergic rhinitis, cause unspecified   . Personal history of colonic polyps   . Unspecified constipation   . Other constipation   . Unspecified hearing loss   . Elevated blood pressure reading without diagnosis of hypertension   . External hemorrhoids without mention of complication   . Mixed hyperlipidemia   . Unspecified essential hypertension   . Lichen, unspecified     sclerosis  . Memory loss   . Sprain of neck   . Osteoporosis, unspecified   . Edema   . Urticaria, unspecified   . Varicose veins of lower extremities with other complications   . Dizziness and giddiness   . GERD (gastroesophageal reflux disease)   . Breast cancer     right  . Cataracts, bilateral     complications from eye surgery   Past Surgical History  Procedure Date  . Stress perfusion cardiac study 7/22008    Normal with normal EF  . Dexa 2001    Osteopenia  . Partial hysterectomy 1987    fibroids  . Colonoscopy 11/02    polyp  . Dexa     improved OP  . Cataract extraction 04/2003  . Ovarian  tumor 11/04    benign  . Bilateral oophorectomy     bilateral  . Colonoscopy 09/2004    polyps  . Dexa 11/2004    OP  . Breast biopsy 12/07    pos ductal carcinoma in situ  . Nuclear stress 05/2007    negative  . Colonoscopy 11/08    adenomatous polyps  . Dexa 2/09    OP T score-3.0 spine and -2.5 in fem neck  . Breast lumpectomy 2008    RIGHT   History  Substance Use Topics  . Smoking status: Never Smoker   . Smokeless tobacco: Not on file  . Alcohol Use: No   Family History  Problem Relation Age of Onset  . Brain cancer Father   . Diabetes Father   . Kidney cancer Mother     renal cell  . Cancer Mother   . Diabetes Brother   . Diabetes Brother   . Colon cancer Brother   . Alzheimer's disease      Aunt   Allergies  Allergen Reactions  . Cephalexin   . Clarithromycin   . Codeine   . Hydrocodone   . Penicillins   . Risedronate Sodium  Current Outpatient Prescriptions on File Prior to Visit  Medication Sig Dispense Refill  . alendronate (FOSAMAX) 70 MG tablet 1 by mouth every week as directed  4 tablet  11  . amLODipine (NORVASC) 5 MG tablet Take 1 tablet (5 mg total) by mouth daily.  30 tablet  11  . Calcium Carbonate-Vitamin D (CALTRATE 600+D) 600-400 MG-UNIT per tablet Take 1 tablet by mouth 2 (two) times daily.        . cholecalciferol (VITAMIN D) 1000 UNITS tablet Take 1,000 Units by mouth daily.        . clobetasol (TEMOVATE) 0.05 % cream Apply 1 application topically 2 (two) times daily.        . fluticasone (FLONASE) 50 MCG/ACT nasal spray Place 2 sprays into the nose daily.  16 g  11  . meclizine (ANTIVERT) 25 MG tablet TAKE 1 TABLET (25 MG TOTAL) BY MOUTH 3 (THREE) TIMES DAILY AS NEEDED.  30 tablet  0  . NON FORMULARY Support hose to thigh 15 mm/hg       . sulfamethoxazole-trimethoprim (BACTRIM DS,SEPTRA DS) 800-160 MG per tablet Take 1 tablet by mouth 2 (two) times daily.  20 tablet  0  . sulfamethoxazole-trimethoprim (BACTRIM DS,SEPTRA DS) 800-160  MG per tablet Take 1 tablet by mouth 2 (two) times daily.  20 tablet  0      Review of Systems    Review of Systems  Constitutional: Negative for fever, appetite change, fatigue and unexpected weight change.  Eyes: Negative for pain and visual disturbance.  Respiratory: Negative for cough and shortness of breath.   Cardiovascular: Negative for cp or palpitations    Gastrointestinal: Negative for nausea, diarrhea and constipation.  Genitourinary: Negative for urgency and frequency.  Skin: Negative for pallor or rash  pos for redness MSK pos for soreness in area of cellulitis that is improving  Neurological: Negative for weakness, light-headedness, numbness and headaches.  Hematological: Negative for adenopathy. Does not bruise/bleed easily.  Psychiatric/Behavioral: Negative for dysphoric mood. The patient is not nervous/anxious.      Objective:   Physical Exam  Constitutional: She appears well-developed and well-nourished. No distress.       Frail appearing elderly female   HENT:  Head: Normocephalic and atraumatic.  Eyes: Conjunctivae normal and EOM are normal. Pupils are equal, round, and reactive to light.  Cardiovascular: Normal rate and regular rhythm.   Musculoskeletal: She exhibits edema and tenderness.       Swelling in area of cellulitis is decreased but not resolved -ant R ankle and foot  Mildly tender Nl rom ankle  See skin exam  Neurological: She is alert. She exhibits normal muscle tone.  Skin: Skin is warm and dry. No rash noted. There is erythema. No pallor.       Area of cellulitis over anterior ankle and foot is improved  Redness is fading well/ area of redness has decreased also  Scab on anterior ankle is healed without drainage or fluctuance  Mildly tender   Psychiatric: She has a normal mood and affect.       Baseline timid affect           Assessment & Plan:

## 2012-09-28 NOTE — Patient Instructions (Addendum)
Continue the antibiotic and finish it all  I think you are improving Elevate that leg and foot as much as possible A warm compress (like a heating pad on low) may be helpful also (use it for 10 minutes at a time with breaks in between )  Keep area clean with antibacterial soap and water  Follow up with me middle of next week  If worse pain/redness/ swelling or if you develop any fever- call us as soon as possible

## 2012-10-01 ENCOUNTER — Encounter: Payer: Self-pay | Admitting: Gastroenterology

## 2012-10-03 ENCOUNTER — Encounter: Payer: Self-pay | Admitting: Family Medicine

## 2012-10-03 ENCOUNTER — Ambulatory Visit (INDEPENDENT_AMBULATORY_CARE_PROVIDER_SITE_OTHER)
Admission: RE | Admit: 2012-10-03 | Discharge: 2012-10-03 | Disposition: A | Discharge status: Home / Self Care | Payer: Medicare Other | Source: Ambulatory Visit | Attending: Family Medicine | Admitting: Family Medicine

## 2012-10-03 ENCOUNTER — Other Ambulatory Visit: Payer: Self-pay | Admitting: Family Medicine

## 2012-10-03 ENCOUNTER — Ambulatory Visit (INDEPENDENT_AMBULATORY_CARE_PROVIDER_SITE_OTHER): Payer: Medicare Other | Admitting: Family Medicine

## 2012-10-03 VITALS — BP 112/58 | HR 76 | Temp 97.5°F | Ht 59.0 in | Wt 104.0 lb

## 2012-10-03 DIAGNOSIS — L0291 Cutaneous abscess, unspecified: Secondary | ICD-10-CM

## 2012-10-03 DIAGNOSIS — L0390 Cellulitis, unspecified: Secondary | ICD-10-CM

## 2012-10-03 NOTE — Assessment & Plan Note (Addendum)
This looks much better overall - R leg and ankle Due to pain (which I suspect may be swelling related ) - xray is negative for changes of osteomyelitis Enc her to start wearing supp hose and elevate legs Will finish abx

## 2012-10-03 NOTE — Patient Instructions (Addendum)
I think the infection is looking much better Finish the antibiotic Xray today - will update you with result later  Wear support stockings - if needed buy a bigger stocking for right leg if it is too painful to get them on  Elevate legs  Use heat if needed Update me if no further improvement or if worse

## 2012-10-03 NOTE — Progress Notes (Signed)
Subjective:    Patient ID: Deborah Espinoza, female    DOB: September 26, 1933, 78 y.o.   MRN: [id number]  HPI Here for f/u of cellulitis of R leg   Taking sulfa abx and was improving  Was getting much better per pt and then started worsening again -- primarily swelling (but at this time both feet equally due to not wearing supp stockings) Feels tight  Not as painful   Wears support hose when not too bad - has not worn them in a while and now both feet are quite swollen  Still has not finished the sulfa abx Redness is much better  No fever- feels fine   Patient Active Problem List  Diagnosis  . HYPERLIPIDEMIA  . DECREASED HEARING  . HYPERTENSION, ESSENTIAL NOS  . VARICOSE VEINS LOWER EXTREMITIES W/OTH COMPS  . HEMORRHOIDS, EXTERNAL  . ALLERGIC RHINITIS  . CONSTIPATION  . CONSTIPATION, CHRONIC  . LICHEN NOS  . URTICARIA  . OSTEOPOROSIS  . VERTIGO  . MEMORY LOSS  . PERIPHERAL EDEMA  . COLONIC POLYPS, HX OF  . NECK SPRAIN AND STRAIN  . Neuropathy  . hx: breast cancer, right, DCIS, receptor +  . Tick bite  . Rash on lips  . Cellulitis of toe of left foot  . Paronychia of great toe of left foot  . Cellulitis   Past Medical History  Diagnosis Date  . Allergic rhinitis, cause unspecified   . Personal history of colonic polyps   . Unspecified constipation   . Other constipation   . Unspecified hearing loss   . Elevated blood pressure reading without diagnosis of hypertension   . External hemorrhoids without mention of complication   . Mixed hyperlipidemia   . Unspecified essential hypertension   . Lichen, unspecified     sclerosis  . Memory loss   . Sprain of neck   . Osteoporosis, unspecified   . Edema   . Urticaria, unspecified   . Varicose veins of lower extremities with other complications   . Dizziness and giddiness   . GERD (gastroesophageal reflux disease)   . Breast cancer     right  . Cataracts, bilateral     complications from eye surgery   Past  Surgical History  Procedure Date  . Stress perfusion cardiac study 7/22008    Normal with normal EF  . Dexa 2001    Osteopenia  . Partial hysterectomy 1987    fibroids  . Colonoscopy 11/02    polyp  . Dexa     improved OP  . Cataract extraction 04/2003  . Ovarian tumor 11/04    benign  . Bilateral oophorectomy     bilateral  . Colonoscopy 09/2004    polyps  . Dexa 11/2004    OP  . Breast biopsy 12/07    pos ductal carcinoma in situ  . Nuclear stress 05/2007    negative  . Colonoscopy 11/08    adenomatous polyps  . Dexa 2/09    OP T score-3.0 spine and -2.5 in fem neck  . Breast lumpectomy 2008    RIGHT   History  Substance Use Topics  . Smoking status: Never Smoker   . Smokeless tobacco: Not on file  . Alcohol Use: No   Family History  Problem Relation Age of Onset  . Brain cancer Father   . Diabetes Father   . Kidney cancer Mother     renal cell  . Cancer Mother   . Diabetes Brother   .  Diabetes Brother   . Colon cancer Brother   . Alzheimer's disease      Aunt   Allergies  Allergen Reactions  . Cephalexin   . Clarithromycin   . Codeine   . Hydrocodone   . Penicillins   . Risedronate Sodium    Current Outpatient Prescriptions on File Prior to Visit  Medication Sig Dispense Refill  . alendronate (FOSAMAX) 70 MG tablet 1 by mouth every week as directed  4 tablet  11  . Calcium Carbonate-Vitamin D (CALTRATE 600+D) 600-400 MG-UNIT per tablet Take 1 tablet by mouth 2 (two) times daily.        . cholecalciferol (VITAMIN D) 1000 UNITS tablet Take 1,000 Units by mouth daily.        . clobetasol (TEMOVATE) 0.05 % cream Apply 1 application topically 2 (two) times daily.        . fluticasone (FLONASE) 50 MCG/ACT nasal spray Place 2 sprays into the nose daily.  16 g  11  . meclizine (ANTIVERT) 25 MG tablet TAKE 1 TABLET (25 MG TOTAL) BY MOUTH 3 (THREE) TIMES DAILY AS NEEDED.  30 tablet  0  . NON FORMULARY Support hose to thigh 15 mm/hg       .  sulfamethoxazole-trimethoprim (BACTRIM DS,SEPTRA DS) 800-160 MG per tablet Take 1 tablet by mouth 2 (two) times daily.  20 tablet  0  . sulfamethoxazole-trimethoprim (BACTRIM DS,SEPTRA DS) 800-160 MG per tablet Take 1 tablet by mouth 2 (two) times daily.  20 tablet  0  . [DISCONTINUED] amLODipine (NORVASC) 5 MG tablet Take 1 tablet (5 mg total) by mouth daily.  30 tablet  11      Review of Systems Review of Systems  Constitutional: Negative for fever, appetite change, fatigue and unexpected weight change.  Eyes: Negative for pain and visual disturbance.  Respiratory: Negative for cough and shortness of breath.  neg for PND or orthopnea Cardiovascular: Negative for cp or palpitations    Gastrointestinal: Negative for nausea, diarrhea and constipation.  Genitourinary: Negative for urgency and frequency.  Skin: Negative for pallor or rash   Neurological: Negative for weakness, light-headedness, numbness and headaches.  Hematological: Negative for adenopathy. Does not bruise/bleed easily.  Psychiatric/Behavioral: Negative for dysphoric mood. The patient is not nervous/anxious.         Objective:   Physical Exam  Constitutional: She appears well-developed and well-nourished. No distress.       Frail appearing elderly female  Neck: Normal range of motion. Neck supple.  Cardiovascular: Normal rate and regular rhythm.   Pulmonary/Chest: Effort normal and breath sounds normal.  Musculoskeletal: She exhibits edema. She exhibits no tenderness.       Diffusely swollen feet and ankles - one plus pitting  Is equal bilaterally Much less tenderness over R ankle   Neurological: She is alert. She has normal reflexes.  Skin: Skin is warm and dry. No rash noted. There is erythema. No pallor.       Erythema on anterior ankle surrounding scab is almost gone No warmth No drainage or open skin   Psychiatric: She has a normal mood and affect.          Assessment & Plan:

## 2012-10-17 ENCOUNTER — Ambulatory Visit
Admission: RE | Admit: 2012-10-17 | Discharge: 2012-10-17 | Disposition: A | Discharge status: Home / Self Care | Payer: Medicare Other | Source: Ambulatory Visit | Attending: Obstetrics | Admitting: Obstetrics

## 2012-10-17 DIAGNOSIS — Z853 Personal history of malignant neoplasm of breast: Secondary | ICD-10-CM

## 2012-11-14 ENCOUNTER — Ambulatory Visit (INDEPENDENT_AMBULATORY_CARE_PROVIDER_SITE_OTHER): Payer: Medicare Other | Admitting: Family Medicine

## 2012-11-14 ENCOUNTER — Encounter: Payer: Self-pay | Admitting: Family Medicine

## 2012-11-14 VITALS — BP 132/68 | HR 100 | Temp 97.4°F | Ht 59.0 in | Wt 109.5 lb

## 2012-11-14 DIAGNOSIS — R609 Edema, unspecified: Secondary | ICD-10-CM

## 2012-11-14 MED ORDER — FUROSEMIDE 20 MG PO TABS: 20.0000 mg | ORAL_TABLET | Freq: Every day | ORAL | Status: DC

## 2012-11-14 NOTE — Patient Instructions (Addendum)
Labs today for swelling Elevate feet when you can Start furosemide 20 mg each am  Follow up with me in about a week

## 2012-11-14 NOTE — Assessment & Plan Note (Signed)
Worsening likely from venous insufficiency Lab today incl cmet and BNP Start furosemide 20 qd Disc side eff  Adv to elevate legs F/u 1 wk

## 2012-11-14 NOTE — Progress Notes (Signed)
Subjective:    Patient ID: Deborah Espinoza, female    DOB: [birth year], 80 y.o.   MRN: [id number]  HPI Walked in today asking to be seen without appt Legs are very swelling - just not getting better - has been 1-2 months  They are hurting a bit - feeling tight- when she walks   Has gained 5 lb  No cp or shortness of breath  No PND or orthopnea   Has worn support stockings for a while - was very hard to get them on and off  ? If they help  Patient Active Problem List  Diagnosis  . HYPERLIPIDEMIA  . DECREASED HEARING  . HYPERTENSION, ESSENTIAL NOS  . VARICOSE VEINS LOWER EXTREMITIES W/OTH COMPS  . HEMORRHOIDS, EXTERNAL  . ALLERGIC RHINITIS  . CONSTIPATION  . CONSTIPATION, CHRONIC  . LICHEN NOS  . URTICARIA  . OSTEOPOROSIS  . VERTIGO  . MEMORY LOSS  . PERIPHERAL EDEMA  . COLONIC POLYPS, HX OF  . NECK SPRAIN AND STRAIN  . Neuropathy  . hx: breast cancer, right, DCIS, receptor +  . Tick bite  . Rash on lips  . Cellulitis of toe of left foot  . Paronychia of great toe of left foot  . Cellulitis   Past Medical History  Diagnosis Date  . Allergic rhinitis, cause unspecified   . Personal history of colonic polyps   . Unspecified constipation   . Other constipation   . Unspecified hearing loss   . Elevated blood pressure reading without diagnosis of hypertension   . External hemorrhoids without mention of complication   . Mixed hyperlipidemia   . Unspecified essential hypertension   . Lichen, unspecified     sclerosis  . Memory loss   . Sprain of neck   . Osteoporosis, unspecified   . Edema   . Urticaria, unspecified   . Varicose veins of lower extremities with other complications   . Dizziness and giddiness   . GERD (gastroesophageal reflux disease)   . Breast cancer     right  . Cataracts, bilateral     complications from eye surgery   Past Surgical History  Procedure Date  . Stress perfusion cardiac study 7/22008    Normal with normal EF  . Dexa 2001      Osteopenia  . Partial hysterectomy 1987    fibroids  . Colonoscopy 11/02    polyp  . Dexa     improved OP  . Cataract extraction 04/2003  . Ovarian tumor 11/04    benign  . Bilateral oophorectomy     bilateral  . Colonoscopy 09/2004    polyps  . Dexa 11/2004    OP  . Breast biopsy 12/07    pos ductal carcinoma in situ  . Nuclear stress 05/2007    negative  . Colonoscopy 11/08    adenomatous polyps  . Dexa 2/09    OP T score-3.0 spine and -2.5 in fem neck  . Breast lumpectomy 2008    RIGHT   History  Substance Use Topics  . Smoking status: Never Smoker   . Smokeless tobacco: Not on file  . Alcohol Use: No   Family History  Problem Relation Age of Onset  . Brain cancer Father   . Diabetes Father   . Kidney cancer Mother     renal cell  . Cancer Mother   . Diabetes Brother   . Diabetes Brother   . Colon cancer Brother   . Alzheimer's  disease      Aunt   Allergies  Allergen Reactions  . Cephalexin   . Clarithromycin   . Codeine   . Hydrocodone   . Penicillins   . Risedronate Sodium    Current Outpatient Prescriptions on File Prior to Visit  Medication Sig Dispense Refill  . alendronate (FOSAMAX) 70 MG tablet 1 by mouth every week as directed  4 tablet  11  . amLODipine (NORVASC) 5 MG tablet TAKE 1 TABLET (5 MG TOTAL) BY MOUTH DAILY.  30 tablet  5  . Calcium Carbonate-Vitamin D (CALTRATE 600+D) 600-400 MG-UNIT per tablet Take 1 tablet by mouth 2 (two) times daily.        . cholecalciferol (VITAMIN D) 1000 UNITS tablet Take 1,000 Units by mouth daily.        . clobetasol (TEMOVATE) 0.05 % cream Apply 1 application topically 2 (two) times daily.        . fluticasone (FLONASE) 50 MCG/ACT nasal spray Place 2 sprays into the nose daily.  16 g  11  . meclizine (ANTIVERT) 25 MG tablet TAKE 1 TABLET (25 MG TOTAL) BY MOUTH 3 (THREE) TIMES DAILY AS NEEDED.  30 tablet  0  . NON FORMULARY Support hose to thigh 15 mm/hg           Review of Systems Review of Systems   Constitutional: Negative for fever, appetite change, fatigue and unexpected weight change.  Eyes: Negative for pain and visual disturbance.  Respiratory: Negative for cough and shortness of breath.   Cardiovascular: Negative for cp or palpitations    Gastrointestinal: Negative for nausea, diarrhea and constipation.  Genitourinary: Negative for urgency and frequency.  Skin: Negative for pallor or rash   Neurological: Negative for weakness, light-headedness, numbness and headaches.  Hematological: Negative for adenopathy. Does not bruise/bleed easily.  Psychiatric/Behavioral: Negative for dysphoric mood. The patient is not nervous/anxious.         Objective:   Physical Exam  Constitutional: She appears well-developed and well-nourished. No distress.       Frail appearing elderly female  HENT:  Head: Normocephalic and atraumatic.  Mouth/Throat: Oropharynx is clear and moist.  Eyes: Conjunctivae normal and EOM are normal. Pupils are equal, round, and reactive to light. Right eye exhibits no discharge. Left eye exhibits no discharge. No scleral icterus.  Neck: Normal range of motion. Neck supple. No JVD present. Carotid bruit is not present. No thyromegaly present.  Cardiovascular: Normal rate, regular rhythm, normal heart sounds and intact distal pulses.  Exam reveals no gallop.   Pulmonary/Chest: Effort normal and breath sounds normal. No respiratory distress. She has no wheezes. She exhibits no tenderness.  Abdominal: Soft. Bowel sounds are normal. She exhibits no distension, no abdominal bruit and no mass. There is no tenderness.  Musculoskeletal: She exhibits edema.       One plus pitting edema to the knee  No redness or skin interruption No oozing  Varicosities noted   Lymphadenopathy:    She has no cervical adenopathy.  Neurological: She is alert.  Skin: Skin is warm and dry. No rash noted. No erythema.  Psychiatric: She has a normal mood and affect.       Very timid  (baseline)- pt's husband does most of the talking for her - he is tangential and very difficult to follow at times           Assessment & Plan:

## 2012-11-15 ENCOUNTER — Telehealth: Payer: Self-pay | Admitting: Family Medicine

## 2012-11-15 LAB — COMPREHENSIVE METABOLIC PANEL
ALT: 20 U/L (ref 0–35)
AST: 24 U/L (ref 0–37)
Alkaline Phosphatase: 83 U/L (ref 39–117)
Creatinine, Ser: 0.7 mg/dL (ref 0.4–1.2)
Sodium: 135 mEq/L (ref 135–145)
Total Bilirubin: 0.6 mg/dL (ref 0.3–1.2)

## 2012-11-15 MED ORDER — POTASSIUM CHLORIDE CRYS ER 20 MEQ PO TBCR: 20.0000 meq | EXTENDED_RELEASE_TABLET | Freq: Every day | ORAL | Status: DC

## 2012-11-15 NOTE — Telephone Encounter (Signed)
Labs ok but K is low  Please call in K supplement  Will re check this level at her f/u

## 2012-11-16 NOTE — Telephone Encounter (Signed)
Pt and husband notified of lab results and to start taking K, Rx called in to [facility] [place], [address] Per pt request.

## 2012-11-21 ENCOUNTER — Ambulatory Visit (INDEPENDENT_AMBULATORY_CARE_PROVIDER_SITE_OTHER): Payer: Medicare Other | Admitting: Family Medicine

## 2012-11-21 ENCOUNTER — Encounter: Payer: Self-pay | Admitting: Family Medicine

## 2012-11-21 VITALS — BP 138/64 | HR 84 | Temp 97.5°F | Ht 59.0 in | Wt 106.2 lb

## 2012-11-21 DIAGNOSIS — R609 Edema, unspecified: Secondary | ICD-10-CM

## 2012-11-21 DIAGNOSIS — E876 Hypokalemia: Secondary | ICD-10-CM

## 2012-11-21 DIAGNOSIS — I10 Essential (primary) hypertension: Secondary | ICD-10-CM

## 2012-11-21 LAB — BASIC METABOLIC PANEL
BUN: 14 mg/dL (ref 6–23)
CO2: 29 mEq/L (ref 19–32)
Chloride: 99 mEq/L (ref 96–112)
Glucose, Bld: 162 mg/dL — ABNORMAL HIGH (ref 70–99)
Potassium: 3 mEq/L — ABNORMAL LOW (ref 3.5–5.1)

## 2012-11-21 MED ORDER — CLOBETASOL PROPIONATE 0.05 % EX CREA: 1.0000 "application " | TOPICAL_CREAM | Freq: Two times a day (BID) | CUTANEOUS | Status: DC

## 2012-11-21 MED ORDER — FUROSEMIDE 20 MG PO TABS: 20.0000 mg | ORAL_TABLET | Freq: Every day | ORAL | Status: DC

## 2012-11-21 NOTE — Progress Notes (Signed)
Subjective:    Patient ID: Deborah Espinoza, female    DOB: June 12, 1933, 79 y.o.   MRN: [id number]  HPI Here for f/u of pedal edema This was worsening at last visit (pt has hx of venous insuff)  Put on lasix 20 mg daily Also K     Chemistry      Component Value Date/Time   NA 135 11/14/2012 1550   K 3.3* 11/14/2012 1550   CL 100 11/14/2012 1550   CO2 28 11/14/2012 1550   BUN 13 11/14/2012 1550   CREATININE 0.7 11/14/2012 1550      Component Value Date/Time   CALCIUM 10.3 11/14/2012 1550   ALKPHOS 83 11/14/2012 1550   AST 24 11/14/2012 1550   ALT 20 11/14/2012 1550   BILITOT 0.6 11/14/2012 1550      Wt is down 3.25 lb today  She thinks the swelling is improved Legs are not longer hurting  She avoids salt as much as she can  Patient Active Problem List  Diagnosis  . HYPERLIPIDEMIA  . DECREASED HEARING  . HYPERTENSION, ESSENTIAL NOS  . VARICOSE VEINS LOWER EXTREMITIES W/OTH COMPS  . HEMORRHOIDS, EXTERNAL  . ALLERGIC RHINITIS  . CONSTIPATION, CHRONIC  . LICHEN NOS  . OSTEOPOROSIS  . MEMORY LOSS  . PERIPHERAL EDEMA  . COLONIC POLYPS, HX OF  . NECK SPRAIN AND STRAIN  . Neuropathy  . hx: breast cancer, right, DCIS, receptor +  . Rash on lips  . Hypokalemia   Past Medical History  Diagnosis Date  . Allergic rhinitis, cause unspecified   . Personal history of colonic polyps   . Unspecified constipation   . Other constipation   . Unspecified hearing loss   . Elevated blood pressure reading without diagnosis of hypertension   . External hemorrhoids without mention of complication   . Mixed hyperlipidemia   . Unspecified essential hypertension   . Lichen, unspecified     sclerosis  . Memory loss   . Sprain of neck   . Osteoporosis, unspecified   . Edema   . Urticaria, unspecified   . Varicose veins of lower extremities with other complications   . Dizziness and giddiness   . GERD (gastroesophageal reflux disease)   . Breast cancer     right  . Cataracts, bilateral    complications from eye surgery   Past Surgical History  Procedure Date  . Stress perfusion cardiac study 7/22008    Normal with normal EF  . Dexa 2001    Osteopenia  . Partial hysterectomy 1987    fibroids  . Colonoscopy 11/02    polyp  . Dexa     improved OP  . Cataract extraction 04/2003  . Ovarian tumor 11/04    benign  . Bilateral oophorectomy     bilateral  . Colonoscopy 09/2004    polyps  . Dexa 11/2004    OP  . Breast biopsy 12/07    pos ductal carcinoma in situ  . Nuclear stress 05/2007    negative  . Colonoscopy 11/08    adenomatous polyps  . Dexa 2/09    OP T score-3.0 spine and -2.5 in fem neck  . Breast lumpectomy 2008    RIGHT   History  Substance Use Topics  . Smoking status: Never Smoker   . Smokeless tobacco: Not on file  . Alcohol Use: No   Family History  Problem Relation Age of Onset  . Brain cancer Father   . Diabetes Father   .  Kidney cancer Mother     renal cell  . Cancer Mother   . Diabetes Brother   . Diabetes Brother   . Colon cancer Brother   . Alzheimer's disease      Aunt   Allergies  Allergen Reactions  . Cephalexin   . Clarithromycin   . Codeine   . Hydrocodone   . Penicillins   . Risedronate Sodium    Current Outpatient Prescriptions on File Prior to Visit  Medication Sig Dispense Refill  . alendronate (FOSAMAX) 70 MG tablet 1 by mouth every week as directed  4 tablet  11  . amLODipine (NORVASC) 5 MG tablet TAKE 1 TABLET (5 MG TOTAL) BY MOUTH DAILY.  30 tablet  5  . Calcium Carbonate-Vitamin D (CALTRATE 600+D) 600-400 MG-UNIT per tablet Take 1 tablet by mouth 2 (two) times daily.        . cholecalciferol (VITAMIN D) 1000 UNITS tablet Take 1,000 Units by mouth daily.        . clobetasol (TEMOVATE) 0.05 % cream Apply 1 application topically 2 (two) times daily.        . fluticasone (FLONASE) 50 MCG/ACT nasal spray Place 2 sprays into the nose daily.  16 g  11  . furosemide (LASIX) 20 MG tablet Take 1 tablet (20 mg total)  by mouth daily.  30 tablet  3  . meclizine (ANTIVERT) 25 MG tablet TAKE 1 TABLET (25 MG TOTAL) BY MOUTH 3 (THREE) TIMES DAILY AS NEEDED.  30 tablet  0  . NON FORMULARY Support hose to thigh 15 mm/hg       . potassium chloride SA (K-DUR,KLOR-CON) 20 MEQ tablet Take 1 tablet (20 mEq total) by mouth daily.  30 tablet  3      Review of Systems Review of Systems  Constitutional: Negative for fever, appetite change, fatigue and unexpected weight change.  Eyes: Negative for pain and visual disturbance.  Respiratory: Negative for cough and shortness of breath.   Cardiovascular: Negative for cp or palpitations   pos for pedal edema that is improved, neg for PND or orthopnea Gastrointestinal: Negative for nausea, diarrhea and constipation.  Genitourinary: Negative for urgency and frequency.  Skin: Negative for pallor or rash   Neurological: Negative for weakness, light-headedness, numbness and headaches.  Hematological: Negative for adenopathy. Does not bruise/bleed easily.  Psychiatric/Behavioral: Negative for dysphoric mood. The patient is not nervous/anxious.         Objective:   Physical Exam  Constitutional: She appears well-developed and well-nourished. No distress.  HENT:  Head: Normocephalic and atraumatic.  Eyes: Conjunctivae normal and EOM are normal. Pupils are equal, round, and reactive to light. No scleral icterus.  Neck: Normal range of motion. Neck supple. No JVD present. No thyromegaly present.  Cardiovascular: Normal rate, regular rhythm and normal heart sounds.   Pulmonary/Chest: Effort normal and breath sounds normal. No respiratory distress. She has no wheezes. She has no rales.       No crackles  Musculoskeletal: She exhibits edema.       Edema is trace below the knee without redness or skin change Some varicosities Overall improved   Lymphadenopathy:    She has no cervical adenopathy.  Neurological: She is alert.  Skin: Skin is warm and dry. No rash noted.    Psychiatric: She has a normal mood and affect.       Very timid - husband does most of the talking for her  Assessment & Plan:

## 2012-11-21 NOTE — Assessment & Plan Note (Signed)
BP: 138/64 mmHg   This is stable on lasix with imp edema

## 2012-11-21 NOTE — Assessment & Plan Note (Signed)
Improved with lasix 20 mg daily  Lab today for bmet Will adj K if needed  Disc elevation of legs, avoidance of sodium  Rev last labs

## 2012-11-21 NOTE — Patient Instructions (Addendum)
I'm glad that swelling is improved with the furosemide  Keep feel elevated when you can  Avoid salty foods  Labs today

## 2012-11-21 NOTE — Assessment & Plan Note (Signed)
Lab today on supplementation No cramps Is on furosemide

## 2012-11-22 ENCOUNTER — Telehealth: Payer: Self-pay | Admitting: Family Medicine

## 2012-11-22 MED ORDER — POTASSIUM CHLORIDE CRYS ER 20 MEQ PO TBCR: 20.0000 meq | EXTENDED_RELEASE_TABLET | Freq: Two times a day (BID) | ORAL | Status: DC

## 2012-11-22 NOTE — Telephone Encounter (Signed)
Pt notified of lab results and to increase her K, Rx called in as prescribed and lab appt scheduled for 12/07/12

## 2012-11-22 NOTE — Telephone Encounter (Signed)
Labs show K is down further with lasix and blood sugar was up a bit  I need to increase her K Please call in px for 2 pills oncdaily  Re check K and also a1c in 2 weeks for hypokalemia and hyperglycemia

## 2012-12-07 ENCOUNTER — Other Ambulatory Visit (INDEPENDENT_AMBULATORY_CARE_PROVIDER_SITE_OTHER): Payer: Medicare Other

## 2012-12-07 DIAGNOSIS — E876 Hypokalemia: Secondary | ICD-10-CM

## 2012-12-07 DIAGNOSIS — R7989 Other specified abnormal findings of blood chemistry: Secondary | ICD-10-CM

## 2012-12-07 LAB — HEMOGLOBIN A1C: Hgb A1c MFr Bld: 5.9 % (ref 4.6–6.5)

## 2012-12-10 ENCOUNTER — Encounter: Payer: Self-pay | Admitting: *Deleted

## 2013-01-14 ENCOUNTER — Telehealth: Payer: Self-pay | Admitting: Family Medicine

## 2013-01-14 NOTE — Telephone Encounter (Signed)
Caller: Belenda Cruise from [facility] ; Phone: [phone]; Reason for Call: Dr.  Ernestina Penna saw her for annual pap/ exam.  Patient seems to have worseining dementia; also lower extremity edema and that provider would prefer for her to be seen here within the next week or so.  Belenda Cruise states no need to call OB/GYN office; notes were faxed regarding exam.  If questions, please contact Kristin.

## 2013-01-14 NOTE — Telephone Encounter (Signed)
Called pt but phone busy (pt may not have power due to weather), will try to call back tomorrow

## 2013-01-14 NOTE — Telephone Encounter (Signed)
Please call patient (if necessary a family member)- and schedule an appt with me- preferably 30 min - tell her that her gyn is concerned about her swelling and also memory

## 2013-01-15 ENCOUNTER — Other Ambulatory Visit: Payer: Self-pay | Admitting: Family Medicine

## 2013-01-15 NOTE — Telephone Encounter (Signed)
appt scheduled for Friday FYI: while scheduling appt pt told me that she has felt a lump in her right breast for a few months and forgot to let her GYN know so she wanted you to look at it Friday

## 2013-01-15 NOTE — Telephone Encounter (Signed)
Thanks will see her then 

## 2013-01-15 NOTE — Telephone Encounter (Signed)
Called pt but no answer and no voicemail (kept ringing), will try to call back

## 2013-01-18 ENCOUNTER — Encounter: Payer: Self-pay | Admitting: Family Medicine

## 2013-01-18 ENCOUNTER — Ambulatory Visit (INDEPENDENT_AMBULATORY_CARE_PROVIDER_SITE_OTHER): Payer: Medicare Other | Admitting: Family Medicine

## 2013-01-18 VITALS — BP 122/78 | HR 77 | Temp 97.5°F | Ht 59.0 in | Wt 97.2 lb

## 2013-01-18 DIAGNOSIS — I10 Essential (primary) hypertension: Secondary | ICD-10-CM

## 2013-01-18 DIAGNOSIS — R413 Other amnesia: Secondary | ICD-10-CM

## 2013-01-18 DIAGNOSIS — R609 Edema, unspecified: Secondary | ICD-10-CM

## 2013-01-18 MED ORDER — DONEPEZIL HCL 5 MG PO TABS: 5.0000 mg | ORAL_TABLET | Freq: Every evening | ORAL | Status: DC | PRN

## 2013-01-18 NOTE — Assessment & Plan Note (Signed)
Much imp with lasix -also 9 lb wt loss of fluid Will re check K at 2 mo f/u

## 2013-01-18 NOTE — Patient Instructions (Addendum)
I think you have age related memory loss (we refer to that as dementia) The medicine aricept may slow this down (it will not cure her but may slow down the progression of it)  You should take one 5 mg pill at bedtime and if you have side effects like nausea- please stop it and let me know  If you cannot afford to get it let me know At any time we can also refer you to a specialist -neurology - for further eval  If it goes well , we will increase dose to 10 mg

## 2013-01-18 NOTE — Assessment & Plan Note (Signed)
bp in fair control at this time  No changes needed  Disc lifstyle change with low sodium diet and exercise   

## 2013-01-18 NOTE — Assessment & Plan Note (Signed)
This is worse with poor concentration and short term memory today  Her husband does much of the talking for her - and fixes meals etc (they still live independently)_ MMS was down to 15/30 today- quite low  Also could not do clock draw at all  No underlying mood disorder - perhaps a bit of anxiety- but pt seems content today Disc opt I would like to try aricept 5 if they can afford it (now generic) will look into that - disc poss side eff in detail as well  Will update me with how that goes and f/u 2 mo Also neuro ref is an option-will continue to consider Disc safety in detail >25 min spent with face to face with patient, >50% counseling and/or coordinating care

## 2013-01-18 NOTE — Progress Notes (Signed)
Subjective:    Patient ID: Deborah Espinoza, female    DOB: 1933-03-12, 79 y.o.   MRN: [id number]  HPI Here for several issues incl edema and memory loss and lump in R breast  On lasix for leg swelling  She does not think it is any worse than last time  No leg pain  Wt is down 9 lb - does not know why- because she eats very well , could be fluid loss from the diuretic   Chemistry      Component Value Date/Time   NA 135 11/21/2012 1443   K 4.2 12/07/2012 1413   CL 99 11/21/2012 1443   CO2 29 11/21/2012 1443   BUN 14 11/21/2012 1443   CREATININE 0.8 11/21/2012 1443      Component Value Date/Time   CALCIUM 10.0 11/21/2012 1443   ALKPHOS 83 11/14/2012 1550   AST 24 11/14/2012 1550   ALT 20 11/14/2012 1550   BILITOT 0.6 11/14/2012 1550      Memory issues  Notices when she gets interrupted she will loose her place  Per husband - worse in the past year No longer drives -she decided herself not to renew her license  He will ask her to do things and she forgets almost immediately  occ she worries that people are in the house or stealing from her - this is intermittent  Good days and bad days  Also a lot of trouble with concentration  Long term memory is fine- only trouble with short term memory   She did MMS exam back in 2010 Given px for aricept- but she could not afford it (was brand name at that time)   She does not have a breast burning sensation (that is the side you had cancer and surgery on)- is followed closely  Gyn examined her - was ok mammo ok in December    Patient Active Problem List  Diagnosis  . HYPERLIPIDEMIA  . DECREASED HEARING  . HYPERTENSION, ESSENTIAL NOS  . VARICOSE VEINS LOWER EXTREMITIES W/OTH COMPS  . HEMORRHOIDS, EXTERNAL  . ALLERGIC RHINITIS  . CONSTIPATION, CHRONIC  . LICHEN NOS  . OSTEOPOROSIS  . MEMORY LOSS  . PERIPHERAL EDEMA  . COLONIC POLYPS, HX OF  . NECK SPRAIN AND STRAIN  . Neuropathy  . hx: breast cancer, right, DCIS, receptor +  . Rash  on lips  . Hypokalemia   Past Medical History  Diagnosis Date  . Allergic rhinitis, cause unspecified   . Personal history of colonic polyps   . Unspecified constipation   . Other constipation   . Unspecified hearing loss   . Elevated blood pressure reading without diagnosis of hypertension   . External hemorrhoids without mention of complication   . Mixed hyperlipidemia   . Unspecified essential hypertension   . Lichen, unspecified     sclerosis  . Memory loss   . Sprain of neck   . Osteoporosis, unspecified   . Edema   . Urticaria, unspecified   . Varicose veins of lower extremities with other complications   . Dizziness and giddiness   . GERD (gastroesophageal reflux disease)   . Breast cancer     right  . Cataracts, bilateral     complications from eye surgery   Past Surgical History  Procedure Laterality Date  . Stress perfusion cardiac study  7/22008    Normal with normal EF  . Dexa  2001    Osteopenia  . Partial hysterectomy  1987  fibroids  . Colonoscopy  11/02    polyp  . Dexa      improved OP  . Cataract extraction  04/2003  . Ovarian tumor  11/04    benign  . Bilateral oophorectomy      bilateral  . Colonoscopy  09/2004    polyps  . Dexa  11/2004    OP  . Breast biopsy  12/07    pos ductal carcinoma in situ  . Nuclear stress  05/2007    negative  . Colonoscopy  11/08    adenomatous polyps  . Dexa  2/09    OP T score-3.0 spine and -2.5 in fem neck  . Breast lumpectomy  2008    RIGHT   History  Substance Use Topics  . Smoking status: Never Smoker   . Smokeless tobacco: Not on file  . Alcohol Use: No   Family History  Problem Relation Age of Onset  . Brain cancer Father   . Diabetes Father   . Kidney cancer Mother     renal cell  . Cancer Mother   . Diabetes Brother   . Diabetes Brother   . Colon cancer Brother   . Alzheimer's disease      Aunt   Allergies  Allergen Reactions  . Cephalexin   . Clarithromycin   . Codeine   .  Hydrocodone   . Penicillins   . Risedronate Sodium    Current Outpatient Prescriptions on File Prior to Visit  Medication Sig Dispense Refill  . alendronate (FOSAMAX) 70 MG tablet TAKE 1 TABLET BY MOUTH EVERY WEEK AS DIRECTED  4 tablet  1  . amLODipine (NORVASC) 5 MG tablet TAKE 1 TABLET (5 MG TOTAL) BY MOUTH DAILY.  30 tablet  5  . Calcium Carbonate-Vitamin D (CALTRATE 600+D) 600-400 MG-UNIT per tablet Take 1 tablet by mouth 2 (two) times daily.        . cholecalciferol (VITAMIN D) 1000 UNITS tablet Take 1,000 Units by mouth daily.        . clobetasol cream (TEMOVATE) 0.05 % Apply 1 application topically 2 (two) times daily. As needed  30 g  0  . fluticasone (FLONASE) 50 MCG/ACT nasal spray Place 2 sprays into the nose daily.  16 g  11  . furosemide (LASIX) 20 MG tablet Take 1 tablet (20 mg total) by mouth daily.  30 tablet  11  . meclizine (ANTIVERT) 25 MG tablet TAKE 1 TABLET (25 MG TOTAL) BY MOUTH 3 (THREE) TIMES DAILY AS NEEDED.  30 tablet  0  . NON FORMULARY Support hose to thigh 15 mm/hg       . potassium chloride SA (K-DUR,KLOR-CON) 20 MEQ tablet Take 1 tablet (20 mEq total) by mouth 2 (two) times daily.  60 tablet  11   No current facility-administered medications on file prior to visit.    Review of Systems Review of Systems  Constitutional: Negative for fever, appetite change, fatigue and pos for wt change  Eyes: Negative for pain and visual disturbance.  Respiratory: Negative for cough and shortness of breath.   Cardiovascular: Negative for cp or palpitations   neg for PND or orthopnea and pos for pedal edema  Gastrointestinal: Negative for nausea, diarrhea and constipation.  Genitourinary: Negative for urgency and frequency.  Skin: Negative for pallor or rash   Neurological: Negative for weakness, light-headedness, numbness and headaches.  Hematological: Negative for adenopathy. Does not bruise/bleed easily.  Psychiatric/Behavioral: Negative for dysphoric mood. The patient  is  somewhat anxious , pos for loss of short term memory        Objective:   Physical Exam  Constitutional: She appears well-developed and well-nourished. No distress.  Frail appearing elderly female   HENT:  Head: Normocephalic and atraumatic.  Mouth/Throat: Oropharynx is clear and moist.  Eyes: Conjunctivae and EOM are normal. Pupils are equal, round, and reactive to light. Right eye exhibits no discharge. Left eye exhibits no discharge.  Neck: Normal range of motion. Neck supple. No JVD present. Carotid bruit is not present. No thyromegaly present.  Cardiovascular: Normal rate, regular rhythm and intact distal pulses.  Exam reveals no gallop.   Pulmonary/Chest: Effort normal and breath sounds normal. No respiratory distress. She has no wheezes.  Abdominal: Soft. Bowel sounds are normal. She exhibits no distension, no abdominal bruit and no mass. There is no tenderness.  Musculoskeletal: She exhibits edema.  Trace to 1 plus edema bilat ankles - overall improved   Lymphadenopathy:    She has no cervical adenopathy.  Neurological: She is alert. She has normal reflexes. She displays no tremor. No cranial nerve deficit or sensory deficit. She exhibits normal muscle tone. Coordination normal.  No focal cerebellar signs   Skin: Skin is warm and dry. No rash noted. No erythema. No pallor.  Psychiatric: She has a normal mood and affect.          Assessment & Plan:

## 2013-01-23 ENCOUNTER — Other Ambulatory Visit: Payer: Self-pay | Admitting: *Deleted

## 2013-01-23 MED ORDER — DONEPEZIL HCL 10 MG PO TABS: 5.0000 mg | ORAL_TABLET | Freq: Every evening | ORAL | Status: DC | PRN

## 2013-03-17 ENCOUNTER — Other Ambulatory Visit: Payer: Self-pay | Admitting: Family Medicine

## 2013-03-20 ENCOUNTER — Ambulatory Visit (INDEPENDENT_AMBULATORY_CARE_PROVIDER_SITE_OTHER): Payer: Medicare Other | Admitting: Family Medicine

## 2013-03-20 ENCOUNTER — Encounter: Payer: Self-pay | Admitting: Family Medicine

## 2013-03-20 VITALS — BP 126/72 | HR 68 | Temp 98.2°F | Ht 59.0 in | Wt 97.0 lb

## 2013-03-20 DIAGNOSIS — F0391 Unspecified dementia with behavioral disturbance: Secondary | ICD-10-CM

## 2013-03-20 DIAGNOSIS — M810 Age-related osteoporosis without current pathological fracture: Secondary | ICD-10-CM

## 2013-03-20 MED ORDER — FUROSEMIDE 20 MG PO TABS: 20.0000 mg | ORAL_TABLET | Freq: Every day | ORAL | Status: DC

## 2013-03-20 MED ORDER — ALENDRONATE SODIUM 70 MG PO TABS: 70.0000 mg | ORAL_TABLET | ORAL | Status: DC

## 2013-03-20 MED ORDER — DONEPEZIL HCL 10 MG PO TABS: 10.0000 mg | ORAL_TABLET | Freq: Every evening | ORAL | Status: DC | PRN

## 2013-03-20 NOTE — Assessment & Plan Note (Signed)
Refilled generic fosamax-tolerating that with no recent fx  Disc fall risk Will start using a walker regularly

## 2013-03-20 NOTE — Progress Notes (Signed)
Subjective:    Patient ID: Deborah Espinoza, female    DOB: May 11, 1933, 79 y.o.   MRN: [id number]  HPI Here for f/u of memory loss/ dementia  Started aricept 5 mg -no side effects   Memory is still poor - has good days and bad days  Not changed from last time  Her symptoms are worse at night - gets agitated   Had one fall since last visit - slid / slipped on her back porch-no injuries and she was able to get back up  Otherwise no accidents   Wants to stay in her own home as long as she can Husband is extra vigalent to watch her - but he has mobility issues    Patient Active Problem List   Diagnosis Date Noted  . Hypokalemia 11/21/2012  . Rash on lips 03/06/2012  . hx: breast cancer, right, DCIS, receptor + 11/28/2011  . Neuropathy 09/27/2011  . NECK SPRAIN AND STRAIN 11/06/2010  . VARICOSE VEINS LOWER EXTREMITIES W/OTH COMPS 05/05/2010  . HEMORRHOIDS, EXTERNAL 01/06/2010  . Dementia with behavioral disturbance 07/23/2009  . PERIPHERAL EDEMA 04/03/2009  . HYPERTENSION, ESSENTIAL NOS 01/22/2009  . DECREASED HEARING 09/02/2008  . CONSTIPATION, CHRONIC 04/21/2008  . HYPERLIPIDEMIA 08/30/2007  . COLONIC POLYPS, HX OF 08/30/2007  . ALLERGIC RHINITIS 08/06/2007  . LICHEN NOS 08/06/2007  . OSTEOPOROSIS 08/06/2007   Past Medical History  Diagnosis Date  . Allergic rhinitis, cause unspecified   . Personal history of colonic polyps   . Unspecified constipation   . Other constipation   . Unspecified hearing loss   . Elevated blood pressure reading without diagnosis of hypertension   . External hemorrhoids without mention of complication   . Mixed hyperlipidemia   . Unspecified essential hypertension   . Lichen, unspecified     sclerosis  . Memory loss   . Sprain of neck   . Osteoporosis, unspecified   . Edema   . Urticaria, unspecified   . Varicose veins of lower extremities with other complications   . Dizziness and giddiness   . GERD (gastroesophageal reflux disease)    . Breast cancer     right  . Cataracts, bilateral     complications from eye surgery   Past Surgical History  Procedure Laterality Date  . Stress perfusion cardiac study  7/22008    Normal with normal EF  . Dexa  2001    Osteopenia  . Partial hysterectomy  1987    fibroids  . Colonoscopy  11/02    polyp  . Dexa      improved OP  . Cataract extraction  04/2003  . Ovarian tumor  11/04    benign  . Bilateral oophorectomy      bilateral  . Colonoscopy  09/2004    polyps  . Dexa  11/2004    OP  . Breast biopsy  12/07    pos ductal carcinoma in situ  . Nuclear stress  05/2007    negative  . Colonoscopy  11/08    adenomatous polyps  . Dexa  2/09    OP T score-3.0 spine and -2.5 in fem neck  . Breast lumpectomy  2008    RIGHT   History  Substance Use Topics  . Smoking status: Never Smoker   . Smokeless tobacco: Not on file  . Alcohol Use: No   Family History  Problem Relation Age of Onset  . Brain cancer Father   . Diabetes Father   . Kidney  cancer Mother     renal cell  . Cancer Mother   . Diabetes Brother   . Diabetes Brother   . Colon cancer Brother   . Alzheimer's disease      Aunt   Allergies  Allergen Reactions  . Cephalexin   . Clarithromycin   . Codeine   . Hydrocodone   . Penicillins   . Risedronate Sodium    Current Outpatient Prescriptions on File Prior to Visit  Medication Sig Dispense Refill  . amLODipine (NORVASC) 5 MG tablet TAKE 1 TABLET (5 MG TOTAL) BY MOUTH DAILY.  30 tablet  5  . Calcium Carbonate-Vitamin D (CALTRATE 600+D) 600-400 MG-UNIT per tablet Take 1 tablet by mouth 2 (two) times daily.        . cholecalciferol (VITAMIN D) 1000 UNITS tablet Take 1,000 Units by mouth daily.        . clobetasol cream (TEMOVATE) 0.05 % Apply 1 application topically 2 (two) times daily. As needed  30 g  0  . donepezil (ARICEPT) 10 MG tablet Take 0.5 tablets (5 mg total) by mouth at bedtime as needed.  30 tablet  1  . fluticasone (FLONASE) 50 MCG/ACT  nasal spray Place 2 sprays into the nose daily.  16 g  11  . furosemide (LASIX) 20 MG tablet Take 1 tablet (20 mg total) by mouth daily.  30 tablet  11  . meclizine (ANTIVERT) 25 MG tablet TAKE 1 TABLET (25 MG TOTAL) BY MOUTH 3 (THREE) TIMES DAILY AS NEEDED.  30 tablet  0  . NON FORMULARY Support hose to thigh 15 mm/hg       . potassium chloride SA (K-DUR,KLOR-CON) 20 MEQ tablet Take 1 tablet (20 mEq total) by mouth 2 (two) times daily.  60 tablet  11  . alendronate (FOSAMAX) 70 MG tablet TAKE 1 TABLET BY MOUTH EVERY WEEK AS DIRECTED  4 tablet  1   No current facility-administered medications on file prior to visit.    Review of Systems Review of Systems  Constitutional: Negative for fever, appetite change, fatigue and unexpected weight change.  Eyes: Negative for pain and visual disturbance.  Respiratory: Negative for cough and shortness of breath.   Cardiovascular: Negative for cp or palpitations    Gastrointestinal: Negative for nausea, diarrhea and constipation.  Genitourinary: Negative for urgency and frequency.  Skin: Negative for pallor or rash   Neurological: Negative for weakness, light-headedness, numbness and headaches.  Hematological: Negative for adenopathy. Does not bruise/bleed easily.  Psychiatric/Behavioral: Negative for dysphoric mood. The patient is not nervous/anxious.  pos for memory loss and some confusion in the evenings        Objective:   Physical Exam  Constitutional: She appears well-developed and well-nourished. No distress.  HENT:  Head: Normocephalic and atraumatic.  Mouth/Throat: Oropharynx is clear and moist.  Eyes: Conjunctivae and EOM are normal. Pupils are equal, round, and reactive to light.  Neck: Normal range of motion. Neck supple. No JVD present. Carotid bruit is not present. No thyromegaly present.  Cardiovascular: Normal rate, regular rhythm and intact distal pulses.   Pulmonary/Chest: Effort normal and breath sounds normal. No respiratory  distress. She has no wheezes. She has no rales.  Musculoskeletal: She exhibits edema.  Baseline pedal edema  Lymphadenopathy:    She has no cervical adenopathy.  Neurological: She is alert. She has normal reflexes. She displays no atrophy and no tremor. No cranial nerve deficit or sensory deficit. She exhibits normal muscle tone. Coordination normal.  Gait is slow and somewhat wide based  Skin: Skin is warm and dry. No rash noted. No erythema. No pallor.  Psychiatric: She has a normal mood and affect. Her speech is normal and behavior is normal. Her mood appears not anxious. She is not agitated. Cognition and memory are impaired. She does not exhibit a depressed mood. She exhibits abnormal recent memory.  Mood is good-pt is content sitting in chair  Answers questions appropriately and briefly            Assessment & Plan:

## 2013-03-20 NOTE — Patient Instructions (Addendum)
Use your walker all the time to prevent falls  Increase aricept to 10 mg once daily and let me know if you have side effects  Stay as active as you can  Follow up with me in about 3 months

## 2013-03-20 NOTE — Assessment & Plan Note (Signed)
Tolerates aricept 5 mg and overall stable  Will inc dose to 10 mg and disc expectations -update if any side effects For safety disc Not leaving pt alone  Using walker at all times Administering all meds and keeping them out of her reach otherwise  Staying active-mind and body Suggested reading 36 hour day - book about caring for pts with dementia F/u 3 mo

## 2013-03-27 ENCOUNTER — Other Ambulatory Visit: Payer: Self-pay | Admitting: Family Medicine

## 2013-05-01 ENCOUNTER — Other Ambulatory Visit: Payer: Self-pay | Admitting: Family Medicine

## 2013-06-04 ENCOUNTER — Encounter: Payer: Self-pay | Admitting: Gastroenterology

## 2013-06-21 ENCOUNTER — Ambulatory Visit (INDEPENDENT_AMBULATORY_CARE_PROVIDER_SITE_OTHER): Payer: Medicare Other | Admitting: Family Medicine

## 2013-06-21 ENCOUNTER — Encounter: Payer: Self-pay | Admitting: Family Medicine

## 2013-06-21 VITALS — BP 124/68 | HR 56 | Temp 97.7°F | Ht 59.0 in | Wt 92.5 lb

## 2013-06-21 DIAGNOSIS — E876 Hypokalemia: Secondary | ICD-10-CM

## 2013-06-21 DIAGNOSIS — I10 Essential (primary) hypertension: Secondary | ICD-10-CM

## 2013-06-21 DIAGNOSIS — F0391 Unspecified dementia with behavioral disturbance: Secondary | ICD-10-CM

## 2013-06-21 DIAGNOSIS — R609 Edema, unspecified: Secondary | ICD-10-CM

## 2013-06-21 NOTE — Progress Notes (Signed)
Subjective:    Patient ID: Deborah Espinoza, female    DOB: 1933-06-26, 79 y.o.   MRN: [id number]  HPI Her last visit here we advanced her dose of aricept - to 10 mg  Hopes it is helping  No major side effects  Wt is down 4-5 lb today  Husband thinks she is eating ok overall - few days she is not hungry  occ will eat a lot - going out to eat   Sometimes gets up at night for a snack   No falls or accidents   Did get a bruise on her hand-does not know how it happened   She constantly c/o of "having a cold" - seems fine    Takes lasix for swelling -husband wants to know if she really needs it   Patient Active Problem List   Diagnosis Date Noted  . Hypokalemia 11/21/2012  . Rash on lips 03/06/2012  . hx: breast cancer, right, DCIS, receptor + 11/28/2011  . Neuropathy 09/27/2011  . NECK SPRAIN AND STRAIN 11/06/2010  . VARICOSE VEINS LOWER EXTREMITIES W/OTH COMPS 05/05/2010  . HEMORRHOIDS, EXTERNAL 01/06/2010  . Dementia with behavioral disturbance 07/23/2009  . PERIPHERAL EDEMA 04/03/2009  . HYPERTENSION, ESSENTIAL NOS 01/22/2009  . DECREASED HEARING 09/02/2008  . CONSTIPATION, CHRONIC 04/21/2008  . HYPERLIPIDEMIA 08/30/2007  . COLONIC POLYPS, HX OF 08/30/2007  . ALLERGIC RHINITIS 08/06/2007  . LICHEN NOS 08/06/2007  . OSTEOPOROSIS 08/06/2007   Past Medical History  Diagnosis Date  . Allergic rhinitis, cause unspecified   . Personal history of colonic polyps   . Unspecified constipation   . Other constipation   . Unspecified hearing loss   . Elevated blood pressure reading without diagnosis of hypertension   . External hemorrhoids without mention of complication   . Mixed hyperlipidemia   . Unspecified essential hypertension   . Lichen, unspecified     sclerosis  . Memory loss   . Sprain of neck   . Osteoporosis, unspecified   . Edema   . Urticaria, unspecified   . Varicose veins of lower extremities with other complications   . Dizziness and giddiness   .  GERD (gastroesophageal reflux disease)   . Breast cancer     right  . Cataracts, bilateral     complications from eye surgery   Past Surgical History  Procedure Laterality Date  . Stress perfusion cardiac study  7/22008    Normal with normal EF  . Dexa  2001    Osteopenia  . Partial hysterectomy  1987    fibroids  . Colonoscopy  11/02    polyp  . Dexa      improved OP  . Cataract extraction  04/2003  . Ovarian tumor  11/04    benign  . Bilateral oophorectomy      bilateral  . Colonoscopy  09/2004    polyps  . Dexa  11/2004    OP  . Breast biopsy  12/07    pos ductal carcinoma in situ  . Nuclear stress  05/2007    negative  . Colonoscopy  11/08    adenomatous polyps  . Dexa  2/09    OP T score-3.0 spine and -2.5 in fem neck  . Breast lumpectomy  2008    RIGHT   History  Substance Use Topics  . Smoking status: Never Smoker   . Smokeless tobacco: Not on file  . Alcohol Use: No   Family History  Problem Relation Age of Onset  .  Brain cancer Father   . Diabetes Father   . Kidney cancer Mother     renal cell  . Cancer Mother   . Diabetes Brother   . Diabetes Brother   . Colon cancer Brother   . Alzheimer's disease      Aunt   Allergies  Allergen Reactions  . Cephalexin   . Clarithromycin   . Codeine   . Hydrocodone   . Penicillins   . Risedronate Sodium    Current Outpatient Prescriptions on File Prior to Visit  Medication Sig Dispense Refill  . alendronate (FOSAMAX) 70 MG tablet Take 1 tablet (70 mg total) by mouth every 7 (seven) days. Take with a full glass of water on an empty stomach.  12 tablet  3  . amLODipine (NORVASC) 5 MG tablet TAKE 1 TABLET (5 MG TOTAL) BY MOUTH DAILY.  30 tablet  1  . Calcium Carbonate-Vitamin D (CALTRATE 600+D) 600-400 MG-UNIT per tablet Take 1 tablet by mouth 2 (two) times daily.        . cholecalciferol (VITAMIN D) 1000 UNITS tablet Take 1,000 Units by mouth daily.        . clobetasol cream (TEMOVATE) 0.05 % Apply 1  application topically 2 (two) times daily. As needed  30 g  0  . donepezil (ARICEPT) 10 MG tablet Take 1 tablet (10 mg total) by mouth at bedtime as needed.  90 tablet  3  . fluticasone (FLONASE) 50 MCG/ACT nasal spray Place 2 sprays into the nose daily.  16 g  11  . furosemide (LASIX) 20 MG tablet Take 1 tablet (20 mg total) by mouth daily.  90 tablet  3  . KLOR-CON M20 20 MEQ tablet TAKE 1 TABLET BY MOUTH ONCE A DAY  30 tablet  5  . meclizine (ANTIVERT) 25 MG tablet TAKE 1 TABLET (25 MG TOTAL) BY MOUTH 3 (THREE) TIMES DAILY AS NEEDED.  30 tablet  0  . NON FORMULARY Support hose to thigh 15 mm/hg        No current facility-administered medications on file prior to visit.     Review of Systems Review of Systems  Constitutional: Negative for fever, appetite change, fatigue and unexpected weight change.  Eyes: Negative for pain and visual disturbance.  Respiratory: Negative for cough and shortness of breath.   Cardiovascular: Negative for cp or palpitations   neg for edema  Gastrointestinal: Negative for nausea, diarrhea and constipation.  Genitourinary: Negative for urgency and frequency.  Skin: Negative for pallor or rash   Neurological: Negative for weakness, light-headedness, numbness and headaches.  Hematological: Negative for adenopathy. Does not bruise/bleed easily.  Psychiatric/Behavioral: Negative for dysphoric mood. The patient is not nervous/anxious. Pos for short term memory loss which is stable        Objective:   Physical Exam  Constitutional: She appears well-developed and well-nourished. No distress.  Frail appearing elderly female  Quiet and timid but not confused today  HENT:  Head: Normocephalic and atraumatic.  Eyes: Conjunctivae and EOM are normal. Pupils are equal, round, and reactive to light.  Neck: Normal range of motion. Neck supple. No thyromegaly present.  Cardiovascular: Normal rate and regular rhythm.  Exam reveals no gallop.   Pulmonary/Chest: Effort  normal and breath sounds normal. No respiratory distress. She has no wheezes.  No crackles   Musculoskeletal: She exhibits no edema.  Lymphadenopathy:    She has no cervical adenopathy.  Neurological: She is alert. She has normal reflexes. No cranial nerve  deficit. She exhibits normal muscle tone. Coordination normal.  Skin: Skin is warm and dry. No rash noted. No pallor.  Psychiatric: She has a normal mood and affect.  Quiet Answers questions appropriately Poor short term memory- dementia appears to be stable          Assessment & Plan:

## 2013-06-21 NOTE — Patient Instructions (Addendum)
Continue the aricept Stop the lasix (furosemide) and also stopthe klor con (potassium)- since swelling is not a problem- but call and let me know if swelling gets bad again  I'm glad memory is stable  Make sure to get in 3 meals a day with snacks in light of weight loss  Follow up in about 6 months

## 2013-06-23 NOTE — Assessment & Plan Note (Addendum)
bp in fair control at this time  No changes needed  Disc lifstyle change with low sodium diet and exercise  Will hold lasix (and K) -and monitor closelyu

## 2013-06-23 NOTE — Assessment & Plan Note (Signed)
Due to lasix  Will hold it for now so inst to hold the K as well

## 2013-06-23 NOTE — Assessment & Plan Note (Signed)
This seems stable overall  Will continue on aricept 10 mg  No safety concerns per her husband  Will follow closely - can consider addition of namenda or further eval

## 2013-06-23 NOTE — Assessment & Plan Note (Signed)
This seems to be much improved  Takes lasix prn - and husband wonders if we can stop it - explained it is an as needed med but she does need K if she takes it  Will plan to hold both for now and monitor

## 2013-06-29 ENCOUNTER — Other Ambulatory Visit: Payer: Self-pay | Admitting: Family Medicine

## 2013-10-15 ENCOUNTER — Ambulatory Visit (INDEPENDENT_AMBULATORY_CARE_PROVIDER_SITE_OTHER): Payer: Medicare Other

## 2013-10-15 DIAGNOSIS — Z23 Encounter for immunization: Secondary | ICD-10-CM

## 2013-12-23 ENCOUNTER — Other Ambulatory Visit: Payer: Self-pay | Admitting: Family Medicine

## 2013-12-24 ENCOUNTER — Ambulatory Visit: Payer: Medicare Other | Admitting: Family Medicine

## 2014-01-20 ENCOUNTER — Other Ambulatory Visit: Payer: Self-pay | Admitting: Family Medicine

## 2014-01-20 NOTE — Telephone Encounter (Signed)
Please refill for 6 mo 

## 2014-01-20 NOTE — Telephone Encounter (Signed)
done

## 2014-01-20 NOTE — Telephone Encounter (Signed)
Electronic refill request, no recent/future appt., please advise  

## 2014-04-14 ENCOUNTER — Other Ambulatory Visit: Payer: Self-pay | Admitting: Family Medicine

## 2014-04-15 NOTE — Telephone Encounter (Signed)
Please schedule appt for Aug and refill until then

## 2014-04-15 NOTE — Telephone Encounter (Signed)
Electronic refill request, no recent/future appt., please advise  

## 2014-04-16 NOTE — Telephone Encounter (Signed)
Pt's spouse wanted pt to be seen sooner, so f/u appt scheduled for 05/06/14

## 2014-05-06 ENCOUNTER — Ambulatory Visit (INDEPENDENT_AMBULATORY_CARE_PROVIDER_SITE_OTHER): Payer: Medicare Other | Admitting: Family Medicine

## 2014-05-06 ENCOUNTER — Encounter: Payer: Self-pay | Admitting: Family Medicine

## 2014-05-06 VITALS — BP 122/66 | HR 72 | Temp 98.3°F | Ht 59.0 in | Wt 96.2 lb

## 2014-05-06 DIAGNOSIS — F03918 Unspecified dementia, unspecified severity, with other behavioral disturbance: Secondary | ICD-10-CM

## 2014-05-06 DIAGNOSIS — M810 Age-related osteoporosis without current pathological fracture: Secondary | ICD-10-CM

## 2014-05-06 DIAGNOSIS — R609 Edema, unspecified: Secondary | ICD-10-CM

## 2014-05-06 DIAGNOSIS — I10 Essential (primary) hypertension: Secondary | ICD-10-CM

## 2014-05-06 DIAGNOSIS — F0391 Unspecified dementia with behavioral disturbance: Secondary | ICD-10-CM

## 2014-05-06 MED ORDER — DONEPEZIL HCL 23 MG PO TABS: 23.0000 mg | ORAL_TABLET | Freq: Every day | ORAL | Status: DC

## 2014-05-06 MED ORDER — FLUTICASONE PROPIONATE 50 MCG/ACT NA SUSP: 2.0000 | Freq: Every day | NASAL | Status: DC

## 2014-05-06 MED ORDER — ALENDRONATE SODIUM 70 MG PO TABS: ORAL_TABLET | ORAL | Status: DC

## 2014-05-06 MED ORDER — AMLODIPINE BESYLATE 5 MG PO TABS: ORAL_TABLET | ORAL | Status: DC

## 2014-05-06 NOTE — Assessment & Plan Note (Signed)
bp in fair control at this time  BP Readings from Last 1 Encounters:  05/06/14 122/66   No changes needed Disc lifstyle change with low sodium diet and exercise  Labs today

## 2014-05-06 NOTE — Progress Notes (Signed)
Pre visit review using our clinic review tool, if applicable. No additional management support is needed unless otherwise documented below in the visit note. 

## 2014-05-06 NOTE — Progress Notes (Signed)
Subjective:    Patient ID: Deborah Espinoza, female    DOB: 05/06/1933, 80 y.o.   MRN: [id number]  HPI Here for f/u of chronic medical problems   Wt is up 4 lb with bmi of 19 husb says her appetite is good overall    Dementia  aricept 10 mg  If they could afford it - would like to try higher dose  Some days are better than others  Per husband -no safety issues/ no falls / no accidents  She does stay on her feet a lot but pretty steady  Husband has caregiver stress - her son can help out once per week or so  Other family checks on them occasionally  Husband is not ready to put her in any sort of alt living situation yet   periph edema- seems to be under good control  Not taking lasix currently   At times - itches all over - prone to mosquito bites  Also runny nose  Has not used her flonase recently -likely needs a refill   Not taking calcium and vitamin D -husb forgets to put it out for her   bp is stable today  No cp or palpitations or headaches or edema  No side effects to medicines  BP Readings from Last 3 Encounters:  05/06/14 122/66  06/21/13 124/68  03/20/13 126/72       Chemistry      Component Value Date/Time   NA 135 11/21/2012 1443   K 4.2 12/07/2012 1413   CL 99 11/21/2012 1443   CO2 29 11/21/2012 1443   BUN 14 11/21/2012 1443   CREATININE 0.8 11/21/2012 1443      Component Value Date/Time   CALCIUM 10.0 11/21/2012 1443   ALKPHOS 83 11/14/2012 1550   AST 24 11/14/2012 1550   ALT 20 11/14/2012 1550   BILITOT 0.6 11/14/2012 1550        Review of Systems Review of Systems  Constitutional: Negative for fever, appetite change, and unexpected weight change.  Eyes: Negative for pain and visual disturbance.  Respiratory: Negative for cough and shortness of breath.  neg for pedal edema today Cardiovascular: Negative for cp or palpitations    Gastrointestinal: Negative for nausea, diarrhea and constipation.  Genitourinary: Negative for urgency and frequency.    Skin: Negative for pallor or rash   MSK pos for arthritis aches and pains  Neurological: Negative for weakness, light-headedness, numbness and headaches.  Hematological: Negative for adenopathy. Does not bruise/bleed easily.  Psychiatric/Behavioral: Negative for dysphoric mood. The patient is not nervous/anxious.  pos for progressive dementia with occ wandering and confusion        Objective:   Physical Exam  Constitutional: She appears well-developed and well-nourished. No distress.  Frail appearing elderly female  HENT:  Head: Normocephalic and atraumatic.  Eyes: Conjunctivae and EOM are normal. Pupils are equal, round, and reactive to light. No scleral icterus.  Neck: Normal range of motion. Neck supple. No JVD present. Carotid bruit is not present. No thyromegaly present.  Cardiovascular: Normal rate, regular rhythm and intact distal pulses.  Exam reveals no gallop.   Pulmonary/Chest: Effort normal and breath sounds normal. No respiratory distress. She has no wheezes. She has no rales.  Abdominal: Soft. Bowel sounds are normal. She exhibits no distension and no mass. There is no tenderness.  Musculoskeletal: She exhibits no edema and no tenderness.  Lymphadenopathy:    She has no cervical adenopathy.  Neurological: She is alert. She has  normal reflexes. She displays no tremor. No cranial nerve deficit. She exhibits normal muscle tone. Coordination normal.  Skin: Skin is warm and dry. No rash noted. No erythema. No pallor.  Psychiatric: Her mood appears not anxious. Her affect is blunt. Her speech is delayed. She is slowed. Thought content is not paranoid. Cognition and memory are impaired. She does not exhibit a depressed mood. She expresses no homicidal and no suicidal ideation. She exhibits abnormal recent memory.  Dementia seems stable Pt is quiet and content- will answer an occasional question           Assessment & Plan:   Problem List Items Addressed This Visit      Cardiovascular and Mediastinum   HYPERTENSION, ESSENTIAL NOS - Primary      bp in fair control at this time  BP Readings from Last 1 Encounters:  05/06/14 122/66   No changes needed Disc lifstyle change with low sodium diet and exercise  Labs today    Relevant Medications      amLODIpine (NORVASC) tablet   Other Relevant Orders      CBC with Differential      Comprehensive metabolic panel      TSH      Lipid panel     Nervous and Auditory   Dementia with behavioral disturbance     Ongoing and slt worse Will try inc her aricept to 23 mg daily- if well tolerated and affordable  husb will update me  F/u 6 mo  Disc safety issues in detail     Relevant Medications      donepezil (ARICEPT) tablet     Musculoskeletal and Integument   OSTEOPOROSIS     No falls or fx  Pt declines further dexa  On fosamax Reminded husb to give her ca and D as well  D level with lab today    Relevant Medications      alendronate (FOSAMAX) 70 MG tablet   Other Relevant Orders      Vit D  25 hydroxy (rtn osteoporosis monitoring)     Other   PERIPHERAL EDEMA     Improved No longer on lasix or K  Lab today Disc need to elevate legs when sitting  supp hose if tolerated for varicosities

## 2014-05-06 NOTE — Patient Instructions (Addendum)
Let's try increasing the aricept to the new 23 mg dose  If side effects - let me know  If not affordable - we will go back to the 10 mg dose  Keep an eye on Mitzy- especially if she is prone to wander  Labs today

## 2014-05-06 NOTE — Assessment & Plan Note (Signed)
Ongoing and slt worse Will try inc her aricept to 23 mg daily- if well tolerated and affordable  husb will update me  F/u 6 mo  Disc safety issues in detail

## 2014-05-06 NOTE — Assessment & Plan Note (Signed)
No falls or fx  Pt declines further dexa  On fosamax Reminded husb to give her ca and D as well  D level with lab today

## 2014-05-06 NOTE — Assessment & Plan Note (Signed)
Improved No longer on lasix or K  Lab today Disc need to elevate legs when sitting  supp hose if tolerated for varicosities

## 2014-05-07 ENCOUNTER — Telehealth: Payer: Self-pay | Admitting: Radiology

## 2014-05-07 ENCOUNTER — Telehealth: Payer: Self-pay | Admitting: Family Medicine

## 2014-05-07 DIAGNOSIS — D72829 Elevated white blood cell count, unspecified: Secondary | ICD-10-CM

## 2014-05-07 LAB — COMPREHENSIVE METABOLIC PANEL
ALBUMIN: 3.5 g/dL (ref 3.5–5.2)
ALK PHOS: 85 U/L (ref 39–117)
ALT: 26 U/L (ref 0–35)
AST: 36 U/L (ref 0–37)
BILIRUBIN TOTAL: 0.8 mg/dL (ref 0.2–1.2)
BUN: 22 mg/dL (ref 6–23)
CO2: 28 meq/L (ref 19–32)
Calcium: 10.2 mg/dL (ref 8.4–10.5)
Chloride: 102 mEq/L (ref 96–112)
Creatinine, Ser: 0.9 mg/dL (ref 0.4–1.2)
GFR: 63.43 mL/min (ref >60.00)
GLUCOSE: 119 mg/dL — AB (ref 70–99)
POTASSIUM: 3.7 meq/L (ref 3.5–5.1)
SODIUM: 134 meq/L — AB (ref 135–145)
TOTAL PROTEIN: 6.8 g/dL (ref 6.0–8.3)

## 2014-05-07 LAB — CBC WITH DIFFERENTIAL/PLATELET
BASOS ABS: 0.1 10*3/uL (ref 0.0–0.1)
Basophils Relative: 0.6 % (ref 0.0–3.0)
EOS PCT: 0.2 % (ref 0.0–5.0)
Eosinophils Absolute: 0 10*3/uL (ref 0.0–0.7)
HCT: 40.8 % (ref 36.0–46.0)
Hemoglobin: 13.5 g/dL (ref 12.0–15.0)
LYMPHS ABS: 1.8 10*3/uL (ref 0.7–4.0)
Lymphocytes Relative: 9.1 % — ABNORMAL LOW (ref 12.0–46.0)
MCHC: 33.2 g/dL (ref 30.0–36.0)
MCV: 94.2 fl (ref 78.0–100.0)
MONO ABS: 1.9 10*3/uL — AB (ref 0.1–1.0)
MONOS PCT: 9.3 % (ref 3.0–12.0)
NEUTROS PCT: 80.8 % — AB (ref 43.0–77.0)
Neutro Abs: 16.1 10*3/uL — ABNORMAL HIGH (ref 1.4–7.7)
PLATELETS: 254 10*3/uL (ref 150.0–400.0)
RBC: 4.33 Mil/uL (ref 3.87–5.11)
RDW: 13.7 % (ref 11.5–15.5)

## 2014-05-07 LAB — VITAMIN D 25 HYDROXY (VIT D DEFICIENCY, FRACTURES): VITD: 37.89 ng/mL

## 2014-05-07 LAB — LIPID PANEL
CHOL/HDL RATIO: 3
Cholesterol: 134 mg/dL (ref 0–200)
HDL: 48.9 mg/dL (ref >39.00)
LDL CALC: 66 mg/dL (ref 0–99)
NONHDL: 85.1
Triglycerides: 94 mg/dL (ref 0.0–149.0)
VLDL: 18.8 mg/dL (ref 0.0–40.0)

## 2014-05-07 LAB — TSH: TSH: 0.17 u[IU]/mL — AB (ref 0.35–4.50)

## 2014-05-07 NOTE — Telephone Encounter (Signed)
[facility] called a critical WBC - 20, results given to Dr Glori Bickers

## 2014-05-07 NOTE — Telephone Encounter (Signed)
Relevant patient education mailed to patient.  

## 2014-05-07 NOTE — Telephone Encounter (Signed)
I ordered the Path review, Terri

## 2014-05-07 NOTE — Telephone Encounter (Signed)
See result comment-thanks

## 2014-05-08 ENCOUNTER — Ambulatory Visit (INDEPENDENT_AMBULATORY_CARE_PROVIDER_SITE_OTHER)
Admission: RE | Admit: 2014-05-08 | Discharge: 2014-05-08 | Disposition: A | Discharge status: Home / Self Care | Payer: Medicare Other | Source: Ambulatory Visit | Attending: Family Medicine | Admitting: Family Medicine

## 2014-05-08 ENCOUNTER — Other Ambulatory Visit (INDEPENDENT_AMBULATORY_CARE_PROVIDER_SITE_OTHER): Payer: Medicare Other

## 2014-05-08 ENCOUNTER — Telehealth (INDEPENDENT_AMBULATORY_CARE_PROVIDER_SITE_OTHER): Payer: Medicare Other | Admitting: Family Medicine

## 2014-05-08 DIAGNOSIS — R7989 Other specified abnormal findings of blood chemistry: Secondary | ICD-10-CM | POA: Insufficient documentation

## 2014-05-08 DIAGNOSIS — D72829 Elevated white blood cell count, unspecified: Secondary | ICD-10-CM

## 2014-05-08 LAB — TSH: TSH: 0.7 u[IU]/mL (ref 0.35–4.50)

## 2014-05-08 LAB — T4, FREE: FREE T4: 1.11 ng/dL (ref 0.60–1.60)

## 2014-05-08 LAB — PATHOLOGIST SMEAR REVIEW

## 2014-05-08 NOTE — Telephone Encounter (Signed)
Message copied by Abner Greenspan on Thu May 08, 2014 10:23 AM ------      Message from: Tammi Sou      Created: Wed May 07, 2014  2:43 PM       Gave pt's husband lab results and Dr. Marliss Coots comments. Husband is bringing her in tomorrow at 11:00 for a UA, Chest Xray, and additional labs            Please put lab orders in ------

## 2014-05-12 ENCOUNTER — Other Ambulatory Visit (INDEPENDENT_AMBULATORY_CARE_PROVIDER_SITE_OTHER): Payer: Medicare Other

## 2014-05-12 ENCOUNTER — Encounter: Payer: Self-pay | Admitting: *Deleted

## 2014-05-12 ENCOUNTER — Other Ambulatory Visit: Payer: Self-pay | Admitting: *Deleted

## 2014-05-12 DIAGNOSIS — R8290 Unspecified abnormal findings in urine: Secondary | ICD-10-CM

## 2014-05-12 DIAGNOSIS — D72829 Elevated white blood cell count, unspecified: Secondary | ICD-10-CM

## 2014-05-12 LAB — POCT URINALYSIS DIPSTICK
GLUCOSE UA: NEGATIVE
NITRITE UA: NEGATIVE
PH UA: 6.5
SPEC GRAV UA: 1.015
Urobilinogen, UA: 0.2

## 2014-05-12 MED ORDER — CIPROFLOXACIN HCL 250 MG PO TABS: 250.0000 mg | ORAL_TABLET | Freq: Two times a day (BID) | ORAL | Status: DC

## 2014-05-12 NOTE — Telephone Encounter (Signed)
Pt's spouse notified of UA results and cipro sent to pharmacy

## 2014-05-12 NOTE — Telephone Encounter (Signed)
Left voicemail requesting pt's spouse to call office back

## 2014-05-12 NOTE — Addendum Note (Signed)
Addended by: Tammi Sou on: 05/12/2014 02:17 PM   Modules accepted: Orders

## 2014-05-12 NOTE — Telephone Encounter (Signed)
Message copied by Tammi Sou on Mon May 12, 2014  1:35 PM ------      Message from: Loura Pardon A      Created: Mon May 12, 2014 12:43 PM       Please send for cx for uti       Also call in cipro 250 mg 1 po bid for 7 d # 14 no refills  (can take with food)      Stress to her husband she has to finish it - because she has a uti and we will call with cx result       Enc water intake        ------

## 2014-05-12 NOTE — Addendum Note (Signed)
Addended by: Tammi Sou on: 05/12/2014 04:45 PM   Modules accepted: Orders

## 2014-05-14 LAB — URINE CULTURE: Colony Count: 30000

## 2014-05-19 ENCOUNTER — Other Ambulatory Visit: Payer: Self-pay | Admitting: Family Medicine

## 2014-05-19 DIAGNOSIS — D72829 Elevated white blood cell count, unspecified: Secondary | ICD-10-CM

## 2014-05-21 ENCOUNTER — Telehealth: Payer: Self-pay | Admitting: *Deleted

## 2014-05-21 ENCOUNTER — Other Ambulatory Visit (INDEPENDENT_AMBULATORY_CARE_PROVIDER_SITE_OTHER): Payer: Medicare Other

## 2014-05-21 DIAGNOSIS — D72829 Elevated white blood cell count, unspecified: Secondary | ICD-10-CM

## 2014-05-21 LAB — CBC WITH DIFFERENTIAL/PLATELET
BASOS PCT: 0.3 % (ref 0.0–3.0)
Basophils Absolute: 0 10*3/uL (ref 0.0–0.1)
EOS PCT: 0.8 % (ref 0.0–5.0)
Eosinophils Absolute: 0.1 10*3/uL (ref 0.0–0.7)
HCT: 38.5 % (ref 36.0–46.0)
Hemoglobin: 12.8 g/dL (ref 12.0–15.0)
LYMPHS PCT: 8.3 % — AB (ref 12.0–46.0)
Lymphs Abs: 0.9 10*3/uL (ref 0.7–4.0)
MCHC: 33.3 g/dL (ref 30.0–36.0)
MCV: 91.9 fl (ref 78.0–100.0)
Monocytes Absolute: 0.9 10*3/uL (ref 0.1–1.0)
Monocytes Relative: 8.9 % (ref 3.0–12.0)
NEUTROS PCT: 81.7 % — AB (ref 43.0–77.0)
Neutro Abs: 8.7 10*3/uL — ABNORMAL HIGH (ref 1.4–7.7)
Platelets: 512 10*3/uL — ABNORMAL HIGH (ref 150.0–400.0)
RBC: 4.19 Mil/uL (ref 3.87–5.11)
RDW: 13.7 % (ref 11.5–15.5)
WBC: 10.6 10*3/uL — AB (ref 4.0–10.5)

## 2014-05-21 MED ORDER — DONEPEZIL HCL 10 MG PO TABS: 20.0000 mg | ORAL_TABLET | Freq: Every day | ORAL | Status: DC

## 2014-05-21 NOTE — Telephone Encounter (Signed)
Okay per Dr. Glori Bickers, Rx sent, and Mr. Kamiya notified

## 2014-05-21 NOTE — Telephone Encounter (Signed)
Pt is here for labs and her husband let me know that the donepezil 23mg  is to expensive it is over $100, but the donepezil 10mg  was only $6, pt's husband wanted to know if you could write a new Rx for the donepezil 10 mg with the directions of taking 2 tabs a day so it would be close to the 23mg  that you wanted her to take. Pt is out of medication and didn't have any yesterday so they would need a new Rx sent to the [facility]., pt is waiting in the lobby until Dr. Glori Bickers responds to request

## 2014-09-16 ENCOUNTER — Ambulatory Visit: Payer: Medicare Other

## 2014-09-16 ENCOUNTER — Ambulatory Visit (INDEPENDENT_AMBULATORY_CARE_PROVIDER_SITE_OTHER): Payer: Medicare Other | Admitting: *Deleted

## 2014-09-16 DIAGNOSIS — Z23 Encounter for immunization: Secondary | ICD-10-CM

## 2014-11-05 ENCOUNTER — Ambulatory Visit: Payer: Medicare Other | Admitting: Family Medicine

## 2015-01-19 ENCOUNTER — Other Ambulatory Visit: Payer: Self-pay | Admitting: Family Medicine

## 2015-05-04 ENCOUNTER — Other Ambulatory Visit: Payer: Self-pay | Admitting: Family Medicine

## 2015-05-06 ENCOUNTER — Ambulatory Visit (INDEPENDENT_AMBULATORY_CARE_PROVIDER_SITE_OTHER): Payer: Medicare Other | Admitting: Family Medicine

## 2015-05-06 ENCOUNTER — Encounter: Payer: Self-pay | Admitting: Family Medicine

## 2015-05-06 VITALS — BP 130/76 | HR 67 | Temp 97.7°F | Ht 59.0 in | Wt 98.8 lb

## 2015-05-06 DIAGNOSIS — F0391 Unspecified dementia with behavioral disturbance: Secondary | ICD-10-CM | POA: Diagnosis not present

## 2015-05-06 DIAGNOSIS — Z23 Encounter for immunization: Secondary | ICD-10-CM

## 2015-05-06 DIAGNOSIS — E782 Mixed hyperlipidemia: Secondary | ICD-10-CM | POA: Diagnosis not present

## 2015-05-06 DIAGNOSIS — F03918 Unspecified dementia, unspecified severity, with other behavioral disturbance: Secondary | ICD-10-CM

## 2015-05-06 DIAGNOSIS — I10 Essential (primary) hypertension: Secondary | ICD-10-CM | POA: Diagnosis not present

## 2015-05-06 LAB — COMPREHENSIVE METABOLIC PANEL
ALK PHOS: 90 U/L (ref 39–117)
ALT: 17 U/L (ref 0–35)
AST: 25 U/L (ref 0–37)
Albumin: 4 g/dL (ref 3.5–5.2)
BUN: 20 mg/dL (ref 6–23)
CALCIUM: 11.1 mg/dL — AB (ref 8.4–10.5)
CHLORIDE: 104 meq/L (ref 96–112)
CO2: 28 mEq/L (ref 19–32)
CREATININE: 1.1 mg/dL (ref 0.40–1.20)
GFR: 50.83 mL/min — ABNORMAL LOW (ref >60.00)
Glucose, Bld: 86 mg/dL (ref 70–99)
Potassium: 3.9 mEq/L (ref 3.5–5.1)
Sodium: 138 mEq/L (ref 135–145)
Total Bilirubin: 0.5 mg/dL (ref 0.2–1.2)
Total Protein: 6.9 g/dL (ref 6.0–8.3)

## 2015-05-06 LAB — LIPID PANEL
CHOL/HDL RATIO: 4
Cholesterol: 182 mg/dL (ref 0–200)
HDL: 51.7 mg/dL (ref >39.00)
LDL Cholesterol: 106 mg/dL — ABNORMAL HIGH (ref 0–99)
NonHDL: 130.3
TRIGLYCERIDES: 121 mg/dL (ref 0.0–149.0)
VLDL: 24.2 mg/dL (ref 0.0–40.0)

## 2015-05-06 LAB — CBC WITH DIFFERENTIAL/PLATELET
Basophils Absolute: 0.1 10*3/uL (ref 0.0–0.1)
Basophils Relative: 0.5 % (ref 0.0–3.0)
EOS PCT: 1.3 % (ref 0.0–5.0)
Eosinophils Absolute: 0.1 10*3/uL (ref 0.0–0.7)
HEMATOCRIT: 41.4 % (ref 36.0–46.0)
Hemoglobin: 13.8 g/dL (ref 12.0–15.0)
LYMPHS ABS: 1.6 10*3/uL (ref 0.7–4.0)
Lymphocytes Relative: 14.3 % (ref 12.0–46.0)
MCHC: 33.3 g/dL (ref 30.0–36.0)
MCV: 93.1 fl (ref 78.0–100.0)
MONO ABS: 1 10*3/uL (ref 0.1–1.0)
Monocytes Relative: 9.3 % (ref 3.0–12.0)
Neutro Abs: 8.4 10*3/uL — ABNORMAL HIGH (ref 1.4–7.7)
Neutrophils Relative %: 74.6 % (ref 43.0–77.0)
PLATELETS: 288 10*3/uL (ref 150.0–400.0)
RBC: 4.45 Mil/uL (ref 3.87–5.11)
RDW: 14.2 % (ref 11.5–15.5)
WBC: 11.2 10*3/uL — ABNORMAL HIGH (ref 4.0–10.5)

## 2015-05-06 LAB — TSH: TSH: 0.83 u[IU]/mL (ref 0.35–4.50)

## 2015-05-06 MED ORDER — AMLODIPINE BESYLATE 5 MG PO TABS: ORAL_TABLET | ORAL | Status: DC

## 2015-05-06 NOTE — Progress Notes (Signed)
Subjective:    Patient ID: Deborah Espinoza, female    DOB: [birth year], 81 y.o.   MRN: [id number]  HPI Here for f/u of HTN   Dementia - we inc her aricept dose to 23 mg  She took that for a while and then price went up on it so taking 2 tablets daily (20 mg total)  No accidents of falls recently    bp is stable today  No cp or palpitations or headaches or edema  No side effects to medicines  BP Readings from Last 3 Encounters:  05/06/15 130/76  05/06/14 122/66  06/21/13 124/68     Wt is up 2 lb bmi 19   Due for labs  Takes norvasc 5 mg for her HTN   Patient Active Problem List   Diagnosis Date Noted  . Abnormal TSH 05/08/2014  . Leukocytosis, unspecified 05/08/2014  . Hypokalemia 11/21/2012  . hx: breast cancer, right, DCIS, receptor + 11/28/2011  . Neuropathy 09/27/2011  . NECK SPRAIN AND STRAIN 11/06/2010  . VARICOSE VEINS LOWER EXTREMITIES W/OTH COMPS 05/05/2010  . HEMORRHOIDS, EXTERNAL 01/06/2010  . Dementia with behavioral disturbance 07/23/2009  . PERIPHERAL EDEMA 04/03/2009  . Essential hypertension 01/22/2009  . DECREASED HEARING 09/02/2008  . CONSTIPATION, CHRONIC 04/21/2008  . HYPERLIPIDEMIA 08/30/2007  . COLONIC POLYPS, HX OF 08/30/2007  . ALLERGIC RHINITIS 08/06/2007  . LICHEN NOS 09/47/0962  . OSTEOPOROSIS 08/06/2007   Past Medical History  Diagnosis Date  . Allergic rhinitis, cause unspecified   . Personal history of colonic polyps   . Unspecified constipation   . Other constipation   . Unspecified hearing loss   . Elevated blood pressure reading without diagnosis of hypertension   . External hemorrhoids without mention of complication   . Mixed hyperlipidemia   . Unspecified essential hypertension   . Lichen, unspecified     sclerosis  . Memory loss   . Sprain of neck   . Osteoporosis, unspecified   . Edema   . Urticaria, unspecified   . Varicose veins of lower extremities with other complications   . Dizziness and giddiness   . GERD  (gastroesophageal reflux disease)   . Breast cancer     right  . Cataracts, bilateral     complications from eye surgery   Past Surgical History  Procedure Laterality Date  . Stress perfusion cardiac study  7/22008    Normal with normal EF  . Dexa  2001    Osteopenia  . Partial hysterectomy  1987    fibroids  . Colonoscopy  11/02    polyp  . Dexa      improved OP  . Cataract extraction  04/2003  . Ovarian tumor  11/04    benign  . Bilateral oophorectomy      bilateral  . Colonoscopy  09/2004    polyps  . Dexa  11/2004    OP  . Breast biopsy  12/07    pos ductal carcinoma in situ  . Nuclear stress  05/2007    negative  . Colonoscopy  11/08    adenomatous polyps  . Dexa  2/09    OP T score-3.0 spine and -2.5 in fem neck  . Breast lumpectomy  2008    RIGHT   History  Substance Use Topics  . Smoking status: Never Smoker   . Smokeless tobacco: Not on file  . Alcohol Use: No   Family History  Problem Relation Age of Onset  . Brain cancer  Father   . Diabetes Father   . Kidney cancer Mother     renal cell  . Cancer Mother   . Diabetes Brother   . Diabetes Brother   . Colon cancer Brother   . Alzheimer's disease      Aunt   Allergies  Allergen Reactions  . Cephalexin   . Clarithromycin   . Codeine   . Hydrocodone   . Penicillins   . Risedronate Sodium    Current Outpatient Prescriptions on File Prior to Visit  Medication Sig Dispense Refill  . amLODipine (NORVASC) 5 MG tablet TAKE 1 TABLET BY MOUTH DAILY 30 tablet 11  . donepezil (ARICEPT) 10 MG tablet Take 2 tablets (20 mg total) by mouth at bedtime. 60 tablet 5   No current facility-administered medications on file prior to visit.       Review of Systems Review of Systems  Constitutional: Negative for fever, appetite change, fatigue and unexpected weight change.  Eyes: Negative for pain and visual disturbance.  Respiratory: Negative for cough and shortness of breath.   Cardiovascular: Negative  for cp or palpitations    Gastrointestinal: Negative for nausea, diarrhea and constipation.  Genitourinary: Negative for urgency and frequency.  Skin: Negative for pallor or rash   Neurological: Negative for weakness, light-headedness, numbness and headaches.  Hematological: Negative for adenopathy. Does not bruise/bleed easily.  Psychiatric/Behavioral: Negative for dysphoric mood. Pos for progressive dementia, pos for occ agitation        Objective:   Physical Exam  Constitutional: She appears well-developed and well-nourished. No distress.  Well appearing elderly female with dementia   HENT:  Head: Normocephalic and atraumatic.  Mouth/Throat: Oropharynx is clear and moist.  Eyes: Conjunctivae and EOM are normal. Pupils are equal, round, and reactive to light.  Neck: Normal range of motion. Neck supple. No JVD present. Carotid bruit is not present. No thyromegaly present.  Cardiovascular: Normal rate, regular rhythm, normal heart sounds and intact distal pulses.  Exam reveals no gallop.   Pulmonary/Chest: Effort normal and breath sounds normal. No respiratory distress. She has no wheezes. She has no rales.  No crackles  Abdominal: Soft. Bowel sounds are normal. She exhibits no distension, no abdominal bruit and no mass. There is no tenderness.  Musculoskeletal: She exhibits edema.  Baseline mild ankle edema   Lymphadenopathy:    She has no cervical adenopathy.  Neurological: She is alert. She has normal reflexes.  Skin: Skin is warm and dry. No rash noted.  Psychiatric:  Pleasant , quiet -occ answers a question or quietly talks to herself  Not agitated             Assessment & Plan:   Problem List Items Addressed This Visit    Dementia with behavioral disturbance    Progressive but pt's husband still thinks he can care for her in their home  Continues aricept at 20 mg (23 mg was not affordable)  -tolerates that well  No accidents or falls occ agitation- unable to  assess if depressed but mood in the office is fine       Essential hypertension - Primary    bp in fair control at this time  BP Readings from Last 1 Encounters:  05/06/15 130/76   No changes needed Disc lifstyle change with low sodium diet and exercise  Lab today       Relevant Medications   amLODipine (NORVASC) 5 MG tablet   Other Relevant Orders   Comprehensive metabolic panel (  Completed)   CBC with Differential/Platelet (Completed)   TSH (Completed)   Lipid panel (Completed)   HYPERLIPIDEMIA    Lab today  Diet is fair - she still has an appetite        Relevant Medications   amLODipine (NORVASC) 5 MG tablet   Other Relevant Orders   Lipid panel (Completed)    Other Visit Diagnoses    Need for vaccination with 13-polyvalent pneumococcal conjugate vaccine        Relevant Orders    Pneumococcal conjugate vaccine 13-valent (Completed)

## 2015-05-06 NOTE — Patient Instructions (Signed)
prevnar vaccine today (a pneumonia vaccine) I'm glad you had a shingles vaccine  No changes in medicine  Labs today

## 2015-05-06 NOTE — Progress Notes (Signed)
Pre visit review using our clinic review tool, if applicable. No additional management support is needed unless otherwise documented below in the visit note. 

## 2015-05-07 NOTE — Assessment & Plan Note (Signed)
Lab today  Diet is fair - she still has an appetite

## 2015-05-07 NOTE — Assessment & Plan Note (Signed)
Progressive but pt's husband still thinks he can care for her in their home  Continues aricept at 20 mg (23 mg was not affordable)  -tolerates that well  No accidents or falls occ agitation- unable to assess if depressed but mood in the office is fine

## 2015-05-07 NOTE — Assessment & Plan Note (Signed)
bp in fair control at this time  BP Readings from Last 1 Encounters:  05/06/15 130/76   No changes needed Disc lifstyle change with low sodium diet and exercise  Lab today

## 2015-05-13 ENCOUNTER — Other Ambulatory Visit (INDEPENDENT_AMBULATORY_CARE_PROVIDER_SITE_OTHER): Payer: Medicare Other

## 2015-05-13 ENCOUNTER — Telehealth: Payer: Self-pay

## 2015-05-13 DIAGNOSIS — D72829 Elevated white blood cell count, unspecified: Secondary | ICD-10-CM | POA: Diagnosis not present

## 2015-05-13 DIAGNOSIS — R8290 Unspecified abnormal findings in urine: Secondary | ICD-10-CM

## 2015-05-13 LAB — POCT URINALYSIS DIPSTICK
Blood, UA: NEGATIVE
Glucose, UA: NEGATIVE
NITRITE UA: NEGATIVE
Spec Grav, UA: >=1.03
UROBILINOGEN UA: 0.2
pH, UA: 6

## 2015-05-13 LAB — COMPREHENSIVE METABOLIC PANEL
ALK PHOS: 84 U/L (ref 39–117)
ALT: 16 U/L (ref 0–35)
AST: 22 U/L (ref 0–37)
Albumin: 3.7 g/dL (ref 3.5–5.2)
BUN: 20 mg/dL (ref 6–23)
CALCIUM: 10.7 mg/dL — AB (ref 8.4–10.5)
CO2: 26 mEq/L (ref 19–32)
Chloride: 106 mEq/L (ref 96–112)
Creatinine, Ser: 1.06 mg/dL (ref 0.40–1.20)
GFR: 53.05 mL/min — ABNORMAL LOW (ref >60.00)
GLUCOSE: 82 mg/dL (ref 70–99)
POTASSIUM: 3.7 meq/L (ref 3.5–5.1)
Sodium: 138 mEq/L (ref 135–145)
Total Bilirubin: 0.6 mg/dL (ref 0.2–1.2)
Total Protein: 6.6 g/dL (ref 6.0–8.3)

## 2015-05-13 NOTE — Telephone Encounter (Signed)
Deborah Espinoza left v/m returning call;Please advise.

## 2015-05-13 NOTE — Addendum Note (Signed)
Addended by: Ellamae Sia on: 05/13/2015 12:36 PM   Modules accepted: Orders

## 2015-05-14 LAB — PARATHYROID HORMONE, INTACT (NO CA): PTH: 106 pg/mL — ABNORMAL HIGH (ref 14–64)

## 2015-05-14 NOTE — Telephone Encounter (Signed)
Addressed through result notes  

## 2015-05-15 LAB — URINE CULTURE: Colony Count: 15000

## 2015-05-17 ENCOUNTER — Telehealth: Payer: Self-pay | Admitting: Family Medicine

## 2015-05-17 DIAGNOSIS — E213 Hyperparathyroidism, unspecified: Secondary | ICD-10-CM

## 2015-05-17 DIAGNOSIS — E210 Primary hyperparathyroidism: Secondary | ICD-10-CM | POA: Insufficient documentation

## 2015-05-17 NOTE — Telephone Encounter (Signed)
-----   Message from Tammi Sou, [place] sent at 05/15/2015 12:32 PM EDT ----- Pt's husband notified of lab results and Dr. Marliss Coots comments. Husband agrees with referral to Endo but would like to see someone in [place] area if possible. I advise spouse that one of our [facility] will call to schedule appt

## 2015-05-17 NOTE — Telephone Encounter (Signed)
Ref done  

## 2015-06-10 ENCOUNTER — Other Ambulatory Visit: Payer: Self-pay | Admitting: Family Medicine

## 2015-06-10 DIAGNOSIS — D72829 Elevated white blood cell count, unspecified: Secondary | ICD-10-CM

## 2015-06-17 ENCOUNTER — Other Ambulatory Visit (INDEPENDENT_AMBULATORY_CARE_PROVIDER_SITE_OTHER): Payer: Medicare Other

## 2015-06-17 DIAGNOSIS — D72829 Elevated white blood cell count, unspecified: Secondary | ICD-10-CM

## 2015-06-17 LAB — CBC WITH DIFFERENTIAL/PLATELET
BASOS ABS: 0.1 10*3/uL (ref 0.0–0.1)
Basophils Relative: 0.6 % (ref 0.0–3.0)
EOS ABS: 0.2 10*3/uL (ref 0.0–0.7)
Eosinophils Relative: 2.4 % (ref 0.0–5.0)
HCT: 41.3 % (ref 36.0–46.0)
Hemoglobin: 13.6 g/dL (ref 12.0–15.0)
LYMPHS PCT: 16.2 % (ref 12.0–46.0)
Lymphs Abs: 1.6 10*3/uL (ref 0.7–4.0)
MCHC: 33 g/dL (ref 30.0–36.0)
MCV: 93.6 fl (ref 78.0–100.0)
MONOS PCT: 6.7 % (ref 3.0–12.0)
Monocytes Absolute: 0.6 10*3/uL (ref 0.1–1.0)
NEUTROS PCT: 74.1 % (ref 43.0–77.0)
Neutro Abs: 7.2 10*3/uL (ref 1.4–7.7)
PLATELETS: 288 10*3/uL (ref 150.0–400.0)
RBC: 4.41 Mil/uL (ref 3.87–5.11)
RDW: 14.1 % (ref 11.5–15.5)
WBC: 9.7 10*3/uL (ref 4.0–10.5)

## 2015-06-18 ENCOUNTER — Encounter: Payer: Self-pay | Admitting: *Deleted

## 2015-07-21 ENCOUNTER — Other Ambulatory Visit: Payer: Self-pay | Admitting: Family Medicine

## 2015-10-29 ENCOUNTER — Ambulatory Visit (INDEPENDENT_AMBULATORY_CARE_PROVIDER_SITE_OTHER): Payer: Medicare Other

## 2015-10-29 DIAGNOSIS — Z23 Encounter for immunization: Secondary | ICD-10-CM | POA: Diagnosis not present

## 2016-02-03 ENCOUNTER — Other Ambulatory Visit: Payer: Self-pay | Admitting: Family Medicine

## 2016-02-04 NOTE — Telephone Encounter (Signed)
Please schedule 30 min f/u in about 6 mo and refill until then

## 2016-02-04 NOTE — Telephone Encounter (Signed)
Last OV was med refill on 05/06/15, no future appts. scheduled, please advise

## 2016-02-05 NOTE — Telephone Encounter (Signed)
Med refilled.

## 2016-02-05 NOTE — Telephone Encounter (Signed)
Pt scheduled for 10/2. Please refill meds

## 2016-02-05 NOTE — Telephone Encounter (Signed)
Left voicemail requesting pt to call office back 

## 2016-05-02 ENCOUNTER — Other Ambulatory Visit: Payer: Self-pay | Admitting: Family Medicine

## 2016-08-08 ENCOUNTER — Encounter: Payer: Self-pay | Admitting: Family Medicine

## 2016-08-08 ENCOUNTER — Ambulatory Visit (INDEPENDENT_AMBULATORY_CARE_PROVIDER_SITE_OTHER): Payer: Medicare Other | Admitting: Family Medicine

## 2016-08-08 VITALS — BP 122/68 | HR 54 | Temp 97.6°F | Ht 59.0 in | Wt 99.5 lb

## 2016-08-08 DIAGNOSIS — F0390 Unspecified dementia without behavioral disturbance: Secondary | ICD-10-CM | POA: Diagnosis not present

## 2016-08-08 DIAGNOSIS — E213 Hyperparathyroidism, unspecified: Secondary | ICD-10-CM

## 2016-08-08 DIAGNOSIS — M810 Age-related osteoporosis without current pathological fracture: Secondary | ICD-10-CM | POA: Diagnosis not present

## 2016-08-08 DIAGNOSIS — I10 Essential (primary) hypertension: Secondary | ICD-10-CM | POA: Diagnosis not present

## 2016-08-08 DIAGNOSIS — E782 Mixed hyperlipidemia: Secondary | ICD-10-CM | POA: Diagnosis not present

## 2016-08-08 DIAGNOSIS — Z23 Encounter for immunization: Secondary | ICD-10-CM

## 2016-08-08 DIAGNOSIS — G629 Polyneuropathy, unspecified: Secondary | ICD-10-CM

## 2016-08-08 LAB — CBC WITH DIFFERENTIAL/PLATELET
BASOS ABS: 0.1 10*3/uL (ref 0.0–0.1)
Basophils Relative: 0.7 % (ref 0.0–3.0)
EOS ABS: 0.3 10*3/uL (ref 0.0–0.7)
Eosinophils Relative: 2.7 % (ref 0.0–5.0)
HEMATOCRIT: 41.9 % (ref 36.0–46.0)
HEMOGLOBIN: 14 g/dL (ref 12.0–15.0)
LYMPHS PCT: 17.1 % (ref 12.0–46.0)
Lymphs Abs: 1.6 10*3/uL (ref 0.7–4.0)
MCHC: 33.5 g/dL (ref 30.0–36.0)
MCV: 92.9 fl (ref 78.0–100.0)
Monocytes Absolute: 1 10*3/uL (ref 0.1–1.0)
Monocytes Relative: 11.1 % (ref 3.0–12.0)
Neutro Abs: 6.4 10*3/uL (ref 1.4–7.7)
Neutrophils Relative %: 68.4 % (ref 43.0–77.0)
Platelets: 285 10*3/uL (ref 150.0–400.0)
RBC: 4.51 Mil/uL (ref 3.87–5.11)
RDW: 14.1 % (ref 11.5–15.5)
WBC: 9.4 10*3/uL (ref 4.0–10.5)

## 2016-08-08 LAB — COMPREHENSIVE METABOLIC PANEL
ALBUMIN: 3.8 g/dL (ref 3.5–5.2)
ALT: 18 U/L (ref 0–35)
AST: 23 U/L (ref 0–37)
Alkaline Phosphatase: 84 U/L (ref 39–117)
BILIRUBIN TOTAL: 0.6 mg/dL (ref 0.2–1.2)
BUN: 24 mg/dL — ABNORMAL HIGH (ref 6–23)
CALCIUM: 11 mg/dL — AB (ref 8.4–10.5)
CO2: 29 meq/L (ref 19–32)
CREATININE: 1.07 mg/dL (ref 0.40–1.20)
Chloride: 103 mEq/L (ref 96–112)
GFR: 52.31 mL/min — ABNORMAL LOW (ref >60.00)
Glucose, Bld: 85 mg/dL (ref 70–99)
Potassium: 3.7 mEq/L (ref 3.5–5.1)
Sodium: 138 mEq/L (ref 135–145)
TOTAL PROTEIN: 6.9 g/dL (ref 6.0–8.3)

## 2016-08-08 LAB — LIPID PANEL
CHOLESTEROL: 169 mg/dL (ref 0–200)
HDL: 58.2 mg/dL (ref >39.00)
LDL CALC: 92 mg/dL (ref 0–99)
NonHDL: 110.78
TRIGLYCERIDES: 96 mg/dL (ref 0.0–149.0)
Total CHOL/HDL Ratio: 3
VLDL: 19.2 mg/dL (ref 0.0–40.0)

## 2016-08-08 LAB — VITAMIN D 25 HYDROXY (VIT D DEFICIENCY, FRACTURES): VITD: 47.4 ng/mL (ref 30.00–100.00)

## 2016-08-08 LAB — TSH: TSH: 1.5 u[IU]/mL (ref 0.35–4.50)

## 2016-08-08 MED ORDER — DONEPEZIL HCL 10 MG PO TABS: 20.0000 mg | ORAL_TABLET | Freq: Every evening | ORAL | 3 refills | Status: DC | PRN

## 2016-08-08 MED ORDER — AMLODIPINE BESYLATE 5 MG PO TABS: 5.0000 mg | ORAL_TABLET | Freq: Every day | ORAL | 3 refills | Status: AC

## 2016-08-08 NOTE — Progress Notes (Signed)
Pre visit review using our clinic review tool, if applicable. No additional management support is needed unless otherwise documented below in the visit note. 

## 2016-08-08 NOTE — Patient Instructions (Addendum)
Let's increase the aricept from 10 mg to 20 mg each night -to help slow down the progression of dementia  If she has any side effects-please let me know  Blood pressure is well controlled Start visiting memory care units/nursing homes when you can to get ready for the time when you can no longer care for her yourself  Keep taking her to get her toenails filed and cut  Flu shot today  Labs today  Encourage regular meals and fluids

## 2016-08-08 NOTE — Assessment & Plan Note (Signed)
Disc goals for lipids and reasons to control them Rev low sat fat diet in detail Lab today

## 2016-08-08 NOTE — Assessment & Plan Note (Signed)
Stable-no c/o per husband

## 2016-08-08 NOTE — Assessment & Plan Note (Signed)
bp is stable today  No cp or palpitations or headaches or edema  No side effects to medicines  BP Readings from Last 3 Encounters:  08/08/16 122/68  05/06/15 130/76  05/06/14 122/66     Labs today  Refilled amlodipine

## 2016-08-08 NOTE — Progress Notes (Signed)
Subjective:    Patient ID: Deborah Espinoza, female    DOB: 11-Aug-1933, 82 y.o.   MRN: [id number]  HPI Here for f/u of chronic health problems   Vision is more of a problem now Needs more assistance  Last opth visit was in march  At times hard to get her to take her eye vitamin for macular degeneration (has had cornea transplants)  Wt Readings from Last 3 Encounters:  08/08/16 99 lb 8 oz (45.1 kg)  05/06/15 98 lb 12 oz (44.8 kg)  05/06/14 96 lb 4 oz (43.7 kg)  wt is stable - appetite is pretty good /sometime she needs assistance eating  Does drink juice/water and coffee  bmi is 20.1  bp is stable today  No cp or palpitations or headaches or edema  No side effects to medicines  BP Readings from Last 3 Encounters:  08/08/16 122/68  05/06/15 130/76  05/06/14 122/66     Hx of OP  Was dx with hyperparathyroidism (primary) by Dr Gabriel Carina She was started on risendronate which she did not tolerate Falls  Taking vitamin D otc   Hx of mild renal insuff by endo Lab Results  Component Value Date   CREATININE 1.06 05/13/2015   BUN 20 05/13/2015   NA 138 05/13/2015   K 3.7 05/13/2015   CL 106 05/13/2015   CO2 26 05/13/2015   Due for labs   Hx of low D (mild)  Dementia continues to progress On aricept 20 mg  Husband is still taking care of her - has a hard time due to his own health problems  Does not have help at all  He can leave at her home to go shopping / quick trips  She is sometimes resistant to bathing    Patient Active Problem List   Diagnosis Date Noted  . Hyperparathyroidism, primary ([place]) 05/17/2015  . Abnormal TSH 05/08/2014  . Leukocytosis, unspecified 05/08/2014  . Hypokalemia 11/21/2012  . hx: breast cancer, right, DCIS, receptor + 11/28/2011  . Neuropathy ([place]) 09/27/2011  . NECK SPRAIN AND STRAIN 11/06/2010  . VARICOSE VEINS LOWER EXTREMITIES W/OTH COMPS 05/05/2010  . HEMORRHOIDS, EXTERNAL 01/06/2010  . Dementia without behavioral disturbance  07/23/2009  . PERIPHERAL EDEMA 04/03/2009  . Essential hypertension 01/22/2009  . DECREASED HEARING 09/02/2008  . CONSTIPATION, CHRONIC 04/21/2008  . HYPERLIPIDEMIA 08/30/2007  . COLONIC POLYPS, HX OF 08/30/2007  . ALLERGIC RHINITIS 08/06/2007  . LICHEN NOS [id number]  . Osteoporosis 08/06/2007   Past Medical History:  Diagnosis Date  . Allergic rhinitis, cause unspecified   . Breast cancer ([place])    right  . Cataracts, bilateral    complications from eye surgery  . Dizziness and giddiness   . Edema   . Elevated blood pressure reading without diagnosis of hypertension   . External hemorrhoids without mention of complication   . GERD (gastroesophageal reflux disease)   . Lichen, unspecified    sclerosis  . Memory loss   . Mixed hyperlipidemia   . Osteoporosis, unspecified   . Other constipation   . Personal history of colonic polyps   . Sprain of neck   . Unspecified constipation   . Unspecified essential hypertension   . Unspecified hearing loss   . Urticaria, unspecified   . Varicose veins of lower extremities with other complications    Past Surgical History:  Procedure Laterality Date  . BILATERAL OOPHORECTOMY     bilateral  . BREAST BIOPSY  12/07   pos ductal  carcinoma in situ  . BREAST LUMPECTOMY  2008   RIGHT  . CATARACT EXTRACTION  04/2003  . COLONOSCOPY  11/02   polyp  . COLONOSCOPY  09/2004   polyps  . COLONOSCOPY  11/08   adenomatous polyps  . DEXA  2001   Osteopenia  . DEXA     improved OP  . DEXA  11/2004   OP  . DEXA  2/09   OP T score-3.0 spine and -2.5 in fem neck  . nuclear stress  05/2007   negative  . ovarian tumor  11/04   benign  . PARTIAL HYSTERECTOMY  1987   fibroids  . stress perfusion cardiac study  7/22008   Normal with normal EF   Social History  Substance Use Topics  . Smoking status: Never Smoker  . Smokeless tobacco: Not on file  . Alcohol use No   Family History  Problem Relation Age of Onset  . Brain cancer Father    . Diabetes Father   . Kidney cancer Mother     renal cell  . Cancer Mother   . Diabetes Brother   . Diabetes Brother   . Colon cancer Brother   . Alzheimer's disease      Aunt   Allergies  Allergen Reactions  . Cephalexin   . Clarithromycin   . Codeine   . Hydrocodone   . Penicillins   . Risedronate Sodium    Current Outpatient Prescriptions on File Prior to Visit  Medication Sig Dispense Refill  . Multiple Vitamins-Minerals (PRESERVISION AREDS PO) Take 1 tablet by mouth daily.     No current facility-administered medications on file prior to visit.     Review of Systems Review of Systems  Constitutional: Negative for fever, appetite change, fatigue and unexpected weight change.  Eyes: Negative for pain and visual disturbance.  Respiratory: Negative for cough and shortness of breath.   Cardiovascular: Negative for cp or palpitations    Gastrointestinal: Negative for nausea, diarrhea and constipation.  Genitourinary: Negative for urgency and frequency.  Skin: Negative for pallor or rash   Neurological: Negative for weakness, light-headedness, numbness and headaches. pos for moderate to severe dementia with personality change  Hematological: Negative for adenopathy. Does not bruise/bleed easily.  Psychiatric/Behavioral: Negative for dysphoric mood. The patient is not nervous/anxious. Pos for occ agitation         Objective:   Physical Exam  Constitutional: She appears well-developed and well-nourished. No distress.  Frail appearing elderly female in chair with mod to severe dementia   HENT:  Head: Normocephalic and atraumatic.  Mouth/Throat: Oropharynx is clear and moist.  Eyes: Conjunctivae and EOM are normal. Pupils are equal, round, and reactive to light.  Neck: Normal range of motion. Neck supple. No JVD present. Carotid bruit is not present. No thyromegaly present.  Cardiovascular: Normal rate, regular rhythm, normal heart sounds and intact distal pulses.  Exam  reveals no gallop.   Pulmonary/Chest: Effort normal and breath sounds normal. No respiratory distress. She has no wheezes. She has no rales.  No crackles  Abdominal: Soft. Bowel sounds are normal. She exhibits no distension, no abdominal bruit and no mass. There is no tenderness.  Musculoskeletal: She exhibits no edema or tenderness.  Pedal edema seems resolved today  Lymphadenopathy:    She has no cervical adenopathy.  Neurological: She is alert. She has normal reflexes. No cranial nerve deficit. She exhibits normal muscle tone. Coordination normal.  Skin: Skin is warm and dry. No rash  noted. No pallor.  Psychiatric: Her behavior is normal. Thought content normal. Her mood appears not anxious. Her affect is blunt. Her speech is delayed. Cognition and memory are impaired. She does not exhibit a depressed mood. She is noncommunicative. She exhibits abnormal recent memory.  Mod to severe dementia  Pt mumbles to herself at times           Assessment & Plan:   Problem List Items Addressed This Visit      Cardiovascular and Mediastinum   Essential hypertension - Primary    bp is stable today  No cp or palpitations or headaches or edema  No side effects to medicines  BP Readings from Last 3 Encounters:  08/08/16 122/68  05/06/15 130/76  05/06/14 122/66     Labs today  Refilled amlodipine      Relevant Medications   amLODipine (NORVASC) 5 MG tablet   Other Relevant Orders   CBC with Differential/Platelet (Completed)   Comprehensive metabolic panel (Completed)   TSH (Completed)   Lipid panel (Completed)     Endocrine   Hyperparathyroidism, primary ([place])    Due for labs Husband did not take her for f/u with Dr Gabriel Carina  Check ca/phos and D levels  No new symptoms         Nervous and Auditory   Dementia without behavioral disturbance    Overall gradually worsening  She continues on 10 mg aricept - could not afford 23 mg  Husband did not f/u with inst to dose 20 mg (2 of  the 10) Will try this now in attempt to slow dz progression  Had frank disc regarding pre planning for when pt needs more care-enc husband to investigate memory care units/nsg homes  Would add namenda if she tolerates the aricept well  Appetite is good  Sleep is fragmented       Relevant Medications   donepezil (ARICEPT) 10 MG tablet   Neuropathy ([place])    Stable-no c/o per husband        Musculoskeletal and Integument   Osteoporosis    Pt is not physically up for dexa /due to severe dementia  Disc fall risk  Check D level today Was not tolerant of bisphosphenates       Relevant Medications   Cholecalciferol (VITAMIN D PO)     Other   HYPERLIPIDEMIA    Disc goals for lipids and reasons to control them Rev low sat fat diet in detail Lab today      Relevant Medications   amLODipine (NORVASC) 5 MG tablet   Other Relevant Orders   Lipid panel (Completed)    Other Visit Diagnoses    Need for influenza vaccination       Relevant Orders   Flu Vaccine QUAD 36+ mos IM (Completed)

## 2016-08-08 NOTE — Assessment & Plan Note (Signed)
Overall gradually worsening  She continues on 10 mg aricept - could not afford 23 mg  Husband did not f/u with inst to dose 20 mg (2 of the 10) Will try this now in attempt to slow dz progression  Had frank disc regarding pre planning for when pt needs more care-enc husband to investigate memory care units/nsg homes  Would add namenda if she tolerates the aricept well  Appetite is good  Sleep is fragmented

## 2016-08-08 NOTE — Assessment & Plan Note (Signed)
Due for labs Husband did not take her for f/u with Dr Gabriel Carina  Check ca/phos and D levels  No new symptoms

## 2016-08-08 NOTE — Assessment & Plan Note (Signed)
Pt is not physically up for dexa /due to severe dementia  Disc fall risk  Check D level today Was not tolerant of bisphosphenates

## 2016-08-09 ENCOUNTER — Encounter: Payer: Self-pay | Admitting: *Deleted

## 2016-09-12 ENCOUNTER — Emergency Department: Payer: Medicare Other

## 2016-09-12 ENCOUNTER — Observation Stay
Admission: EM | Admit: 2016-09-12 | Discharge: 2016-09-15 | Disposition: A | Discharge status: Skilled Nursing Facility | Payer: Medicare Other | Attending: Internal Medicine | Admitting: Internal Medicine

## 2016-09-12 ENCOUNTER — Encounter: Payer: Self-pay | Admitting: Intensive Care

## 2016-09-12 DIAGNOSIS — Y998 Other external cause status: Secondary | ICD-10-CM | POA: Diagnosis not present

## 2016-09-12 DIAGNOSIS — Z885 Allergy status to narcotic agent status: Secondary | ICD-10-CM | POA: Insufficient documentation

## 2016-09-12 DIAGNOSIS — M25552 Pain in left hip: Secondary | ICD-10-CM | POA: Diagnosis not present

## 2016-09-12 DIAGNOSIS — I10 Essential (primary) hypertension: Secondary | ICD-10-CM | POA: Insufficient documentation

## 2016-09-12 DIAGNOSIS — S42209A Unspecified fracture of upper end of unspecified humerus, initial encounter for closed fracture: Secondary | ICD-10-CM

## 2016-09-12 DIAGNOSIS — S4290XA Fracture of unspecified shoulder girdle, part unspecified, initial encounter for closed fracture: Secondary | ICD-10-CM | POA: Diagnosis present

## 2016-09-12 DIAGNOSIS — M810 Age-related osteoporosis without current pathological fracture: Secondary | ICD-10-CM | POA: Insufficient documentation

## 2016-09-12 DIAGNOSIS — E210 Primary hyperparathyroidism: Secondary | ICD-10-CM | POA: Diagnosis not present

## 2016-09-12 DIAGNOSIS — M19041 Primary osteoarthritis, right hand: Secondary | ICD-10-CM | POA: Insufficient documentation

## 2016-09-12 DIAGNOSIS — Z8051 Family history of malignant neoplasm of kidney: Secondary | ICD-10-CM | POA: Insufficient documentation

## 2016-09-12 DIAGNOSIS — S42211A Unspecified displaced fracture of surgical neck of right humerus, initial encounter for closed fracture: Principal | ICD-10-CM

## 2016-09-12 DIAGNOSIS — N179 Acute kidney failure, unspecified: Secondary | ICD-10-CM | POA: Diagnosis not present

## 2016-09-12 DIAGNOSIS — M4854XA Collapsed vertebra, not elsewhere classified, thoracic region, initial encounter for fracture: Secondary | ICD-10-CM | POA: Insufficient documentation

## 2016-09-12 DIAGNOSIS — L280 Lichen simplex chronicus: Secondary | ICD-10-CM | POA: Insufficient documentation

## 2016-09-12 DIAGNOSIS — R58 Hemorrhage, not elsewhere classified: Secondary | ICD-10-CM

## 2016-09-12 DIAGNOSIS — Y9384 Activity, sleeping: Secondary | ICD-10-CM | POA: Insufficient documentation

## 2016-09-12 DIAGNOSIS — Z808 Family history of malignant neoplasm of other organs or systems: Secondary | ICD-10-CM | POA: Insufficient documentation

## 2016-09-12 DIAGNOSIS — S4291XA Fracture of right shoulder girdle, part unspecified, initial encounter for closed fracture: Secondary | ICD-10-CM | POA: Diagnosis present

## 2016-09-12 DIAGNOSIS — S8002XA Contusion of left knee, initial encounter: Secondary | ICD-10-CM

## 2016-09-12 DIAGNOSIS — M25562 Pain in left knee: Secondary | ICD-10-CM | POA: Diagnosis not present

## 2016-09-12 DIAGNOSIS — H9190 Unspecified hearing loss, unspecified ear: Secondary | ICD-10-CM | POA: Insufficient documentation

## 2016-09-12 DIAGNOSIS — M6281 Muscle weakness (generalized): Secondary | ICD-10-CM

## 2016-09-12 DIAGNOSIS — Z853 Personal history of malignant neoplasm of breast: Secondary | ICD-10-CM | POA: Insufficient documentation

## 2016-09-12 DIAGNOSIS — Y92013 Bedroom of single-family (private) house as the place of occurrence of the external cause: Secondary | ICD-10-CM | POA: Diagnosis not present

## 2016-09-12 DIAGNOSIS — L8990 Pressure ulcer of unspecified site, unspecified stage: Secondary | ICD-10-CM | POA: Insufficient documentation

## 2016-09-12 DIAGNOSIS — M79641 Pain in right hand: Secondary | ICD-10-CM | POA: Diagnosis not present

## 2016-09-12 DIAGNOSIS — Z833 Family history of diabetes mellitus: Secondary | ICD-10-CM | POA: Insufficient documentation

## 2016-09-12 DIAGNOSIS — I700 Atherosclerosis of aorta: Secondary | ICD-10-CM | POA: Diagnosis not present

## 2016-09-12 DIAGNOSIS — S8001XA Contusion of right knee, initial encounter: Secondary | ICD-10-CM | POA: Diagnosis not present

## 2016-09-12 DIAGNOSIS — E876 Hypokalemia: Secondary | ICD-10-CM | POA: Diagnosis not present

## 2016-09-12 DIAGNOSIS — Z800 Family history of malignant neoplasm of digestive organs: Secondary | ICD-10-CM | POA: Insufficient documentation

## 2016-09-12 DIAGNOSIS — F0390 Unspecified dementia without behavioral disturbance: Secondary | ICD-10-CM | POA: Diagnosis not present

## 2016-09-12 DIAGNOSIS — Z881 Allergy status to other antibiotic agents status: Secondary | ICD-10-CM | POA: Insufficient documentation

## 2016-09-12 DIAGNOSIS — R609 Edema, unspecified: Secondary | ICD-10-CM

## 2016-09-12 DIAGNOSIS — M25511 Pain in right shoulder: Secondary | ICD-10-CM

## 2016-09-12 DIAGNOSIS — E860 Dehydration: Secondary | ICD-10-CM | POA: Diagnosis not present

## 2016-09-12 DIAGNOSIS — R52 Pain, unspecified: Secondary | ICD-10-CM

## 2016-09-12 DIAGNOSIS — W06XXXA Fall from bed, initial encounter: Secondary | ICD-10-CM | POA: Diagnosis not present

## 2016-09-12 DIAGNOSIS — T148XXA Other injury of unspecified body region, initial encounter: Secondary | ICD-10-CM

## 2016-09-12 DIAGNOSIS — I672 Cerebral atherosclerosis: Secondary | ICD-10-CM | POA: Diagnosis not present

## 2016-09-12 DIAGNOSIS — E782 Mixed hyperlipidemia: Secondary | ICD-10-CM | POA: Diagnosis not present

## 2016-09-12 DIAGNOSIS — M25551 Pain in right hip: Secondary | ICD-10-CM | POA: Diagnosis not present

## 2016-09-12 DIAGNOSIS — Z880 Allergy status to penicillin: Secondary | ICD-10-CM | POA: Insufficient documentation

## 2016-09-12 DIAGNOSIS — Z86010 Personal history of colonic polyps: Secondary | ICD-10-CM | POA: Insufficient documentation

## 2016-09-12 DIAGNOSIS — K219 Gastro-esophageal reflux disease without esophagitis: Secondary | ICD-10-CM | POA: Insufficient documentation

## 2016-09-12 DIAGNOSIS — Z888 Allergy status to other drugs, medicaments and biological substances status: Secondary | ICD-10-CM | POA: Insufficient documentation

## 2016-09-12 DIAGNOSIS — B3741 Candidal cystitis and urethritis: Secondary | ICD-10-CM | POA: Insufficient documentation

## 2016-09-12 DIAGNOSIS — S60511A Abrasion of right hand, initial encounter: Secondary | ICD-10-CM | POA: Diagnosis not present

## 2016-09-12 DIAGNOSIS — R262 Difficulty in walking, not elsewhere classified: Secondary | ICD-10-CM

## 2016-09-12 LAB — URINALYSIS COMPLETE WITH MICROSCOPIC (ARMC ONLY)
BILIRUBIN URINE: NEGATIVE
Bacteria, UA: NONE SEEN
GLUCOSE, UA: 50 mg/dL — AB
Hgb urine dipstick: NEGATIVE
Ketones, ur: NEGATIVE mg/dL
LEUKOCYTES UA: NEGATIVE
NITRITE: NEGATIVE
Protein, ur: 30 mg/dL — AB
SPECIFIC GRAVITY, URINE: 1.019 (ref 1.005–1.030)
pH: 5 (ref 5.0–8.0)

## 2016-09-12 LAB — COMPREHENSIVE METABOLIC PANEL
ALT: 29 U/L (ref 14–54)
ANION GAP: 9 (ref 5–15)
AST: 50 U/L — ABNORMAL HIGH (ref 15–41)
Albumin: 3.8 g/dL (ref 3.5–5.0)
Alkaline Phosphatase: 94 U/L (ref 38–126)
BUN: 32 mg/dL — ABNORMAL HIGH (ref 6–20)
CHLORIDE: 106 mmol/L (ref 101–111)
CO2: 28 mmol/L (ref 22–32)
CREATININE: 1.36 mg/dL — AB (ref 0.44–1.00)
Calcium: 11.2 mg/dL — ABNORMAL HIGH (ref 8.9–10.3)
GFR, EST AFRICAN AMERICAN: 41 mL/min — AB (ref >60)
GFR, EST NON AFRICAN AMERICAN: 35 mL/min — AB (ref >60)
Glucose, Bld: 217 mg/dL — ABNORMAL HIGH (ref 65–99)
POTASSIUM: 3.1 mmol/L — AB (ref 3.5–5.1)
Sodium: 143 mmol/L (ref 135–145)
Total Bilirubin: 1.1 mg/dL (ref 0.3–1.2)
Total Protein: 7.1 g/dL (ref 6.5–8.1)

## 2016-09-12 LAB — CBC WITH DIFFERENTIAL/PLATELET
Basophils Absolute: 0.1 10*3/uL (ref 0–0.1)
Basophils Relative: 0 %
EOS ABS: 0 10*3/uL (ref 0–0.7)
EOS PCT: 0 %
HCT: 42.4 % (ref 35.0–47.0)
Hemoglobin: 13.9 g/dL (ref 12.0–16.0)
LYMPHS ABS: 1.6 10*3/uL (ref 1.0–3.6)
LYMPHS PCT: 10 %
MCH: 30.4 pg (ref 26.0–34.0)
MCHC: 32.8 g/dL (ref 32.0–36.0)
MCV: 92.6 fL (ref 80.0–100.0)
MONO ABS: 1 10*3/uL — AB (ref 0.2–0.9)
MONOS PCT: 7 %
Neutro Abs: 13 10*3/uL — ABNORMAL HIGH (ref 1.4–6.5)
Neutrophils Relative %: 83 %
PLATELETS: 295 10*3/uL (ref 150–440)
RBC: 4.58 MIL/uL (ref 3.80–5.20)
RDW: 13.4 % (ref 11.5–14.5)
WBC: 15.8 10*3/uL — ABNORMAL HIGH (ref 3.6–11.0)

## 2016-09-12 LAB — TROPONIN I: Troponin I: <0.03 ng/mL (ref <0.03)

## 2016-09-12 MED ORDER — FENTANYL CITRATE (PF) 100 MCG/2ML IJ SOLN
12.5000 ug | Freq: Four times a day (QID) | INTRAMUSCULAR | Status: DC | PRN
  Administered 2016-09-12 – 2016-09-14 (×5): 12.5 ug via INTRAVENOUS
  Filled 2016-09-12 (×5): qty 2

## 2016-09-12 MED ORDER — ONDANSETRON HCL 4 MG PO TABS: 4.0000 mg | ORAL_TABLET | Freq: Four times a day (QID) | ORAL | Status: DC | PRN

## 2016-09-12 MED ORDER — FLUCONAZOLE 100 MG PO TABS
100.0000 mg | ORAL_TABLET | Freq: Every day | ORAL | Status: DC
  Administered 2016-09-12 – 2016-09-15 (×3): 100 mg via ORAL
  Filled 2016-09-12 (×3): qty 1

## 2016-09-12 MED ORDER — POTASSIUM CHLORIDE CRYS ER 20 MEQ PO TBCR
40.0000 meq | EXTENDED_RELEASE_TABLET | Freq: Once | ORAL | Status: AC
  Administered 2016-09-12: 40 meq via ORAL
  Filled 2016-09-12: qty 2

## 2016-09-12 MED ORDER — FENTANYL CITRATE (PF) 100 MCG/2ML IJ SOLN
12.5000 ug | Freq: Once | INTRAMUSCULAR | Status: AC
  Administered 2016-09-12: 12.5 ug via INTRAVENOUS
  Filled 2016-09-12: qty 2

## 2016-09-12 MED ORDER — SODIUM CHLORIDE 0.9 % IV SOLN
INTRAVENOUS | Status: AC
  Administered 2016-09-12 – 2016-09-13 (×3): via INTRAVENOUS

## 2016-09-12 MED ORDER — ONDANSETRON HCL 4 MG/2ML IJ SOLN: 4.0000 mg | Freq: Four times a day (QID) | INTRAMUSCULAR | Status: DC | PRN

## 2016-09-12 MED ORDER — ACETAMINOPHEN 650 MG RE SUPP: 650.0000 mg | Freq: Four times a day (QID) | RECTAL | Status: DC | PRN

## 2016-09-12 MED ORDER — TRAMADOL HCL 50 MG PO TABS: 50.0000 mg | ORAL_TABLET | Freq: Four times a day (QID) | ORAL | Status: DC | PRN

## 2016-09-12 MED ORDER — SENNOSIDES-DOCUSATE SODIUM 8.6-50 MG PO TABS: 1.0000 | ORAL_TABLET | Freq: Every evening | ORAL | Status: DC | PRN

## 2016-09-12 MED ORDER — AMLODIPINE BESYLATE 5 MG PO TABS
5.0000 mg | ORAL_TABLET | Freq: Every day | ORAL | Status: DC
  Administered 2016-09-12 – 2016-09-14 (×2): 5 mg via ORAL
  Filled 2016-09-12 (×3): qty 1

## 2016-09-12 MED ORDER — ACETAMINOPHEN 325 MG PO TABS
650.0000 mg | ORAL_TABLET | Freq: Four times a day (QID) | ORAL | Status: DC | PRN
  Administered 2016-09-14: 650 mg via ORAL
  Filled 2016-09-12: qty 2

## 2016-09-12 MED ORDER — DONEPEZIL HCL 5 MG PO TABS
20.0000 mg | ORAL_TABLET | Freq: Every day | ORAL | Status: DC
  Administered 2016-09-12 – 2016-09-15 (×4): 20 mg via ORAL
  Filled 2016-09-12 (×4): qty 4

## 2016-09-12 NOTE — ED Triage Notes (Addendum)
Patient arrived by [facility] from residence where she lives with husband. EMS got information from husband where he reported "she fell on Friday out of bed and husband found her face down. Me and my neighbor helped her up from the ground. bruising noted to R arm/shoulder later. She has been ambulating around since the fall just slowly." husband told EMS she has been walking around since fall and EMS states pt was in a different room then the fall on Friday. EMS also reports pt had right arm behind back when they got there. Pt has HX of dementia and HTN. Pt is unable to answer any questions and husband reports this baseline is normal. EMS gave 100 fentanyl. EMS vitals 154/124 b/p, 76HR, 98% RA, 97.7ax

## 2016-09-12 NOTE — H&P (Signed)
[facility] NAME: Deborah Espinoza    MR#:  [id number]  DATE OF BIRTH:  Aug 28, 1933  DATE OF ADMISSION:  09/12/2016  PRIMARY CARE PHYSICIAN: Loura Pardon, MD   REQUESTING/REFERRING PHYSICIAN: Reita Cliche  CHIEF COMPLAINT:  Fall  HISTORY OF PRESENT ILLNESS:  Deborah Espinoza  is a 82 y.o. female with a known history of Dementia, hypertension and hyperlipidemia is brought into the emergency department by her husband after she sustained a fall. According to the husband patient was having more wandering behavior lately and had 2 falls in the past one week. Patient had 1 fall on last Wednesday and underwent on Friday. Patient was complaining of right hand pain and has multiple bruises and ecchymosis and unable to ambulate, patient is brought into the emergency department x-ray has revealed right shoulder fracture and multiple bruises and ecchymosis were noticed  PAST MEDICAL HISTORY:   Past Medical History:  Diagnosis Date  . Allergic rhinitis, cause unspecified   . Breast cancer ([place])    right  . Cataracts, bilateral    complications from eye surgery  . Dizziness and giddiness   . Edema   . Elevated blood pressure reading without diagnosis of hypertension   . External hemorrhoids without mention of complication   . GERD (gastroesophageal reflux disease)   . Lichen, unspecified    sclerosis  . Memory loss   . Mixed hyperlipidemia   . Osteoporosis, unspecified   . Other constipation   . Personal history of colonic polyps   . Sprain of neck   . Unspecified constipation   . Unspecified essential hypertension   . Unspecified hearing loss   . Urticaria, unspecified   . Varicose veins of lower extremities with other complications     PAST SURGICAL HISTOIRY:   Past Surgical History:  Procedure Laterality Date  . BILATERAL OOPHORECTOMY     bilateral  . BREAST BIOPSY  12/07   pos ductal carcinoma in situ  . BREAST LUMPECTOMY  2008    RIGHT  . CATARACT EXTRACTION  04/2003  . COLONOSCOPY  11/02   polyp  . COLONOSCOPY  09/2004   polyps  . COLONOSCOPY  11/08   adenomatous polyps  . DEXA  2001   Osteopenia  . DEXA     improved OP  . DEXA  11/2004   OP  . DEXA  2/09   OP T score-3.0 spine and -2.5 in fem neck  . nuclear stress  05/2007   negative  . ovarian tumor  11/04   benign  . PARTIAL HYSTERECTOMY  1987   fibroids  . stress perfusion cardiac study  7/22008   Normal with normal EF    SOCIAL HISTORY:   Social History  Substance Use Topics  . Smoking status: Never Smoker  . Smokeless tobacco: Never Used  . Alcohol use No    FAMILY HISTORY:   Family History  Problem Relation Age of Onset  . Brain cancer Father   . Diabetes Father   . Kidney cancer Mother     renal cell  . Cancer Mother   . Diabetes Brother   . Diabetes Brother   . Colon cancer Brother   . Alzheimer's disease      Aunt    DRUG ALLERGIES:   Allergies  Allergen Reactions  . Cephalexin   . Clarithromycin   . Codeine   . Hydrocodone   . Penicillins   . Risedronate Sodium  REVIEW OF SYSTEMS:  Review of systems unobtainable as the patient is  demented  MEDICATIONS AT HOME:   Prior to Admission medications   Medication Sig Start Date End Date Taking? Authorizing Provider  amLODipine (NORVASC) 5 MG tablet Take 1 tablet (5 mg total) by mouth daily. 08/08/16  Yes Abner Greenspan, MD  Cholecalciferol (VITAMIN D PO) Take 1 tablet by mouth daily.   Yes Historical Provider, MD  donepezil (ARICEPT) 10 MG tablet Take 2 tablets (20 mg total) by mouth at bedtime as needed. 08/08/16  Yes Abner Greenspan, MD  Multiple Vitamins-Minerals (PRESERVISION AREDS PO) Take 1 tablet by mouth daily.   Yes Historical Provider, MD      VITAL SIGNS:  Blood pressure (!) 169/63, pulse (!) 57, temperature 97.7 F (36.5 C), temperature source Axillary, resp. rate 18, height 5' (1.524 m), weight 62.6 kg (137 lb 14.4 oz), SpO2 100 %.  PHYSICAL  EXAMINATION:  GENERAL:  82 y.o.-year-old patient lying in the bed with no acute distress.  EYES: Pupils equal, round, reactive to light and accommodation. No scleral icterus. Marland Kitchen  HEENT: Head atraumatic, normocephalic. Oropharynx and nasopharynx clear.  NECK:  Supple, no jugular venous distention. No thyroid enlargement, no tenderness.  LUNGS: Moderate breath sounds bilaterally, no wheezing, rales,rhonchi or crepitation. No use of accessory muscles of respiration.  CARDIOVASCULAR: S1, S2 normal. No murmurs, rubs, or gallops.  ABDOMEN: Soft, nontender, nondistended. Bowel sounds present. No organomegaly or mass.  EXTREMITIES: Right upper extremity abducted and externally rotated with multiple bruises No pedal edema, cyanosis, or clubbing.  NEUROLOGIC: Patient is demented and is not following verbal commands, just got pain medicine Gait not checked.  PSYCHIATRIC: The patient is lethargic from the pain medicine SKIN: Multiple bruises and ecchymosis are noticed on the right upper extremity and other extremities No obvious rash, lesion, or ulcer.   LABORATORY PANEL:   CBC  Recent Labs Lab 09/12/16 1419  WBC 15.8*  HGB 13.9  HCT 42.4  PLT 295   ------------------------------------------------------------------------------------------------------------------  Chemistries   Recent Labs Lab 09/12/16 1419  NA 143  K 3.1*  CL 106  CO2 28  GLUCOSE 217*  BUN 32*  CREATININE 1.36*  CALCIUM 11.2*  AST 50*  ALT 29  ALKPHOS 94  BILITOT 1.1   ------------------------------------------------------------------------------------------------------------------  Cardiac Enzymes  Recent Labs Lab 09/12/16 1419  TROPONINI <0.03   ------------------------------------------------------------------------------------------------------------------  RADIOLOGY:  Dg Pelvis 1-2 Views  Result Date: 09/12/2016 CLINICAL DATA:  Bilateral hip pain after falls. EXAM: PELVIS - 1-2 VIEW COMPARISON:   None.- FINDINGS: Pelvic bony ring is intact. No gross abnormality to the hips. There is gas in the rectum. No evidence for a fracture. IMPRESSION: No acute bone abnormality. Electronically Signed   By: Markus Daft M.D.   On: 09/12/2016 16:53   Dg Shoulder Right  Result Date: 09/12/2016 CLINICAL DATA:  Right shoulder pain since a fall out of bed 3 days ago. EXAM: RIGHT SHOULDER - 2+ VIEW COMPARISON:  None. FINDINGS: The patient has an acute surgical neck fracture of the right shoulder with foreshortening of approximately 3.5 cm and 1 shaft with anterior displacement. The fracture appears to involve the greater tuberosity. The humeral head remains located. The acromioclavicular joint is intact. No other acute abnormality is identified. IMPRESSION: Acute surgical neck fracture right humerus as described above. Electronically Signed   By: Inge Rise M.D.   On: 09/12/2016 19:08   Dg Knee 1-2 Views Left  Result Date: 09/12/2016 CLINICAL DATA:  Fall.  EXAM: LEFT KNEE - 1-2 VIEW COMPARISON:  None. FINDINGS: No evidence of fracture, dislocation, or joint effusion. No evidence of arthropathy or other focal bone abnormality. Soft tissues are unremarkable. IMPRESSION: Negative. Electronically Signed   By: Aletta Edouard M.D.   On: 09/12/2016 16:53   Ct Head Wo Contrast  Result Date: 09/12/2016 CLINICAL DATA:  Dementia, multiple falls x4 days, altered mental status EXAM: CT HEAD WITHOUT CONTRAST CT CERVICAL SPINE WITHOUT CONTRAST TECHNIQUE: Multidetector CT imaging of the head and cervical spine was performed following the standard protocol without intravenous contrast. Multiplanar CT image reconstructions of the cervical spine were also generated. COMPARISON:  Cervical spine radiographs dated 11/09/2010 FINDINGS: CT HEAD FINDINGS Brain: No evidence of acute infarction, hemorrhage, hydrocephalus, extra-axial collection or mass lesion/mass effect. Vascular: No hyperdense vessel or unexpected calcification. Skull:  Normal. Negative for fracture or focal lesion. Sinuses/Orbits: Visualized paranasal sinuses are essentially clear. Right mastoid air cells are opacified. Other: Global cortical atrophy.  No ventriculomegaly. Subcortical white matter and periventricular small vessel ischemic changes. Mild intracranial atherosclerosis. CT CERVICAL SPINE FINDINGS Alignment: Normal cervical lordosis. Skull base and vertebrae: No acute fracture. No primary bone lesion or focal pathologic process. Soft tissues and spinal canal: No prevertebral fluid or swelling. No visible canal hematoma. Disc levels:  Moderate degenerative changes at C5-6. Spinal canal remains patent. Upper chest: Visualized lung apices are notable for biapical pleural-parenchymal scarring. Other: Visualized thyroid is unremarkable. IMPRESSION: No evidence of acute intracranial abnormality. Atrophy with small vessel ischemic changes. No evidence of traumatic injury to the cervical spine. Moderate degenerative changes at C5-6. Electronically Signed   By: Julian Hy M.D.   On: 09/12/2016 16:10   Ct Cervical Spine Wo Contrast  Result Date: 09/12/2016 CLINICAL DATA:  Dementia, multiple falls x4 days, altered mental status EXAM: CT HEAD WITHOUT CONTRAST CT CERVICAL SPINE WITHOUT CONTRAST TECHNIQUE: Multidetector CT imaging of the head and cervical spine was performed following the standard protocol without intravenous contrast. Multiplanar CT image reconstructions of the cervical spine were also generated. COMPARISON:  Cervical spine radiographs dated 11/09/2010 FINDINGS: CT HEAD FINDINGS Brain: No evidence of acute infarction, hemorrhage, hydrocephalus, extra-axial collection or mass lesion/mass effect. Vascular: No hyperdense vessel or unexpected calcification. Skull: Normal. Negative for fracture or focal lesion. Sinuses/Orbits: Visualized paranasal sinuses are essentially clear. Right mastoid air cells are opacified. Other: Global cortical atrophy.  No  ventriculomegaly. Subcortical white matter and periventricular small vessel ischemic changes. Mild intracranial atherosclerosis. CT CERVICAL SPINE FINDINGS Alignment: Normal cervical lordosis. Skull base and vertebrae: No acute fracture. No primary bone lesion or focal pathologic process. Soft tissues and spinal canal: No prevertebral fluid or swelling. No visible canal hematoma. Disc levels:  Moderate degenerative changes at C5-6. Spinal canal remains patent. Upper chest: Visualized lung apices are notable for biapical pleural-parenchymal scarring. Other: Visualized thyroid is unremarkable. IMPRESSION: No evidence of acute intracranial abnormality. Atrophy with small vessel ischemic changes. No evidence of traumatic injury to the cervical spine. Moderate degenerative changes at C5-6. Electronically Signed   By: Julian Hy M.D.   On: 09/12/2016 16:10   Dg Hand 2 View Right  Result Date: 09/12/2016 CLINICAL DATA:  82 year old female with a history of fall. Arm pain. EXAM: RIGHT HAND - 2 VIEW COMPARISON:  None. FINDINGS: Osteopenia. Acute nondisplaced fracture at the base of the distal phalanx of the right thumb. Advanced osteoarthritis of the right hand.  No erosive changes. No radiopaque foreign body. IMPRESSION: Acute nondisplaced fracture at the base of the distal  phalanx of the right thumb. Osteopenia and advanced osteoarthritis. Signed, Dulcy Fanny. Earleen Newport, DO [facility] Electronically Signed   By: Corrie Mckusick D.O.   On: 09/12/2016 16:52   Dg Chest Port 1 View  Result Date: 09/12/2016 CLINICAL DATA:  Multiple falls.  Humerus fracture. EXAM: PORTABLE CHEST 1 VIEW COMPARISON:  Right humerus 09/22/2016 and chest radiography 05/08/2014 FINDINGS: There is a displaced fracture of the right humeral neck. The distal humeral component is displaced medially. Right AC joint appears to be intact. Both lungs are clear. Negative for pneumothorax.  Atherosclerotic calcifications at the aortic arch. Heart size is within normal limits. IMPRESSION: No acute chest abnormality. Displaced fracture of the right humeral neck. Aortic atherosclerosis. Electronically Signed   By: Markus Daft M.D.   On: 09/12/2016 17:04   Dg Humerus Right  Result Date: 09/12/2016 CLINICAL DATA:  Fall with right arm injury.  Initial encounter. EXAM: RIGHT HUMERUS - 2+ VIEW COMPARISON:  None. FINDINGS: There is a fracture of the right humeral neck with medial displacement of the humeral shaft relative to the humeral head. There also likely is a fracture of the humeral head through the level of the greater tuberosity and the humeral head appears abnormally rotated. IMPRESSION: Displaced fracture of the surgical neck of the proximal right humerus. Electronically Signed   By: Aletta Edouard M.D.   On: 09/12/2016 16:52   Dg Knee 2 Views Right  Result Date: 09/12/2016 CLINICAL DATA:  82 year old female with a history of dementia and fall. EXAM: RIGHT KNEE - 3 VIEW COMPARISON:  None. FINDINGS: No acute displaced fracture. No focal soft tissue swelling.  No joint effusion. Osteopenia. IMPRESSION: No acute bony abnormality. Signed, Dulcy Fanny. Earleen Newport, DO [facility] Electronically Signed   By: Corrie Mckusick D.O.   On: 09/12/2016 16:53    EKG:   Orders placed or performed in visit on 04/27/07  . Converted [facility] EKG    IMPRESSION AND PLAN:   Deborah Espinoza  is a 82 y.o. female with a known history of Dementia, hypertension and hyperlipidemia is brought into the emergency department by her husband after she sustained a fall. According to the husband patient was having more wandering behavior lately and had 2 falls in the past one week. Patient had 1 fall on last Wednesday and underwent on Friday. Patient was complaining of right hand pain and has multiple bruises and ecchymosis and unable to ambulate  # Acute right surgical  neck fracture of the humerus from the recent fall Admit to MedSurg unit Pain management as needed Nothing by mouth after midnight for possible surgery in a.m. but husband is hoping that she does not need surgery given her chronic dementia and other comorbidities Orthopedics consult is placed PT consult is placed and and case management consult is placed   #Chronic history of dementia continue her home medication Aricept  #Hypokalemia replete potassium and check in a.m.  #Acute cystitis with budding yeast-Diflucan  #Essential hypertension continue Norvasc home medication and titrate as needed  Poor prognosis  All the records are reviewed and case discussed with ED provider. Management plans discussed with the patient, family and they are in agreement.  CODE STATUS: fc,, Husband is the healthcare power of attorney. Not considering palliative care at this time, need to discuss again  TOTAL TIME TAKING CARE OF THIS PATIENT: 42 minutes.   Note: This dictation was prepared with Dragon dictation along with smaller phrase  technology. Any transcriptional errors that result from this process are unintentional.  Nicholes Mango M.D on 09/12/2016 at 7:16 PM  Between 7am to 6pm - Pager - [phone]  After 6pm go to [email or web address] - password [email or web address] [facility]  [facility]  Office  [phone]  CC: Primary care physician; Loura Pardon, MD

## 2016-09-12 NOTE — ED Notes (Signed)
ED Provider at bedside. 

## 2016-09-12 NOTE — ED Notes (Signed)
Husband now at bedside

## 2016-09-12 NOTE — Progress Notes (Signed)
Family Meeting Note  Advance Directive:yes  Today a meeting took place with the Patient's spouse  Patient is unable to participate due [id number] capacity Sedated and demented  The following clinical team members were present during this meeting:MD  The following were discussed:Patient's diagnosis: , Patient's progosis: Unable to determine but poor prognosis and Goals for treatment: Full Code for now but given the patient's clinical situation and wandering behavior with frequent falls and other comorbidities palliative care option need to be discussed again  Additional follow-up to be provided: Hospitalist team and orthopedics  Time spent during discussion:16 min  Bianka Liberati, Illene Silver, MD

## 2016-09-12 NOTE — ED Notes (Signed)
Patient's husband states that he feels torn about leaving, but that he is really tired and needs to go home. He requested that someone call him and tell him what room his wife is in once she is admitted. The best number to reach Mr. Chavanne is [phone].

## 2016-09-12 NOTE — ED Provider Notes (Signed)
[facility] Emergency Department Provider Note ____________________________________________   I have reviewed the triage vital signs and the triage nursing note.  HISTORY  Chief Complaint Fall   Historian Level V caveat, patient unable to provide her own history due to dementia Husband who lives with her provides the history  HPI Deborah Espinoza is a 82 y.o. female with dementia who lives at home with her husband. He states that she has had more wandering behavior in the last couple of weeks, and she had 2 falls this past week. Once on Wednesday where she was found on the floor, and then once on Friday where she was once again found in the floor by her bed.  Over the course of the weekend they noticed that she was complaining of her right arm in pain and it became very ecchymotic. She is also unable to ambulate and she developed bruising to both knees. Unknown whether or not she struck her head. No altered mental status, other than again the multiple falls over the past couple weeks.    Past Medical History:  Diagnosis Date  . Allergic rhinitis, cause unspecified   . Breast cancer ([place])    right  . Cataracts, bilateral    complications from eye surgery  . Dizziness and giddiness   . Edema   . Elevated blood pressure reading without diagnosis of hypertension   . External hemorrhoids without mention of complication   . GERD (gastroesophageal reflux disease)   . Lichen, unspecified    sclerosis  . Memory loss   . Mixed hyperlipidemia   . Osteoporosis, unspecified   . Other constipation   . Personal history of colonic polyps   . Sprain of neck   . Unspecified constipation   . Unspecified essential hypertension   . Unspecified hearing loss   . Urticaria, unspecified   . Varicose veins of lower extremities with other complications     Patient Active Problem List   Diagnosis Date Noted  . Hyperparathyroidism, primary ([place]) 05/17/2015  . Abnormal TSH  05/08/2014  . Leukocytosis, unspecified 05/08/2014  . Hypokalemia 11/21/2012  . hx: breast cancer, right, DCIS, receptor + 11/28/2011  . Neuropathy ([place]) 09/27/2011  . NECK SPRAIN AND STRAIN 11/06/2010  . VARICOSE VEINS LOWER EXTREMITIES W/OTH COMPS 05/05/2010  . HEMORRHOIDS, EXTERNAL 01/06/2010  . Dementia without behavioral disturbance 07/23/2009  . PERIPHERAL EDEMA 04/03/2009  . Essential hypertension 01/22/2009  . DECREASED HEARING 09/02/2008  . CONSTIPATION, CHRONIC 04/21/2008  . HYPERLIPIDEMIA 08/30/2007  . COLONIC POLYPS, HX OF 08/30/2007  . ALLERGIC RHINITIS 08/06/2007  . LICHEN NOS [id number]  . Osteoporosis 08/06/2007    Past Surgical History:  Procedure Laterality Date  . BILATERAL OOPHORECTOMY     bilateral  . BREAST BIOPSY  12/07   pos ductal carcinoma in situ  . BREAST LUMPECTOMY  2008   RIGHT  . CATARACT EXTRACTION  04/2003  . COLONOSCOPY  11/02   polyp  . COLONOSCOPY  09/2004   polyps  . COLONOSCOPY  11/08   adenomatous polyps  . DEXA  2001   Osteopenia  . DEXA     improved OP  . DEXA  11/2004   OP  . DEXA  2/09   OP T score-3.0 spine and -2.5 in fem neck  . nuclear stress  05/2007   negative  . ovarian tumor  11/04   benign  . PARTIAL HYSTERECTOMY  1987   fibroids  . stress perfusion cardiac study  7/22008  Normal with normal EF    Prior to Admission medications   Medication Sig Start Date End Date Taking? Authorizing Provider  amLODipine (NORVASC) 5 MG tablet Take 1 tablet (5 mg total) by mouth daily. 08/08/16  Yes Abner Greenspan, MD  Cholecalciferol (VITAMIN D PO) Take 1 tablet by mouth daily.   Yes Historical Provider, MD  donepezil (ARICEPT) 10 MG tablet Take 2 tablets (20 mg total) by mouth at bedtime as needed. 08/08/16  Yes Abner Greenspan, MD  Multiple Vitamins-Minerals (PRESERVISION AREDS PO) Take 1 tablet by mouth daily.   Yes Historical Provider, MD    Allergies  Allergen Reactions  . Cephalexin   . Clarithromycin   . Codeine   .  Hydrocodone   . Penicillins   . Risedronate Sodium     Family History  Problem Relation Age of Onset  . Brain cancer Father   . Diabetes Father   . Kidney cancer Mother     renal cell  . Cancer Mother   . Diabetes Brother   . Diabetes Brother   . Colon cancer Brother   . Alzheimer's disease      Aunt    Social History Social History  Substance Use Topics  . Smoking status: Never Smoker  . Smokeless tobacco: Never Used  . Alcohol use No    Review of Systems  Constitutional: Negative for Recent illnesses. Eyes: Negative for red eyes. ENT: Negative for nasal congestion Cardiovascular: Negative for chest pain. Respiratory: Negative for shortness of breath. Gastrointestinal: Negative for abdominal pain, vomiting and diarrhea. Genitourinary: Negative for dysuria. Musculoskeletal: Negative for back pain. Skin: Negative for rash. Neurological: Negative for headache. 10 point Review of Systems otherwise negative ____________________________________________   PHYSICAL EXAM:  VITAL SIGNS: ED Triage Vitals  Enc Vitals Group     BP 09/12/16 1400 138/71     Pulse Rate 09/12/16 1400 75     Resp 09/12/16 1400 20     Temp 09/12/16 1400 97.7 F (36.5 C)     Temp Source 09/12/16 1400 Axillary     SpO2 09/12/16 1400 97 %     Weight 09/12/16 1403 137 lb 14.4 oz (62.6 kg)     Height 09/12/16 1403 5' (1.524 m)     Head Circumference --      Peak Flow --      Pain Score --      Pain Loc --      Pain Edu? --      Excl. in [place]? --      Constitutional: Alert and Follows some commands. In no acute distress, but certainly in pain if you move her right arm. HEENT   Head: Normocephalic and atraumatic.      Eyes: Conjunctivae are normal. PERRL. Normal extraocular movements.      Ears:         Nose: No congestion/rhinnorhea.   Mouth/Throat: Mucous membranes are moist.   Neck: No stridor.  Winces a little bit with palpation to the posterior cervical  spine. Cardiovascular/Chest: Normal rate, regular rhythm.  No murmurs, rubs, or gallops. Respiratory: Normal respiratory effort without tachypnea nor retractions. Breath sounds are clear and equal bilaterally. No wheezes/rales/rhonchi. Gastrointestinal: Soft. No distention, no guarding, no rebound. Nontender.    Genitourinary/rectal:Deferred Musculoskeletal: Pelvis stable, but upon range of motion of both hips there is some tenderness. She has ecchymosis over both knees without obvious joint effusion. No calf tenderness. She has severe swelling and ecchymosis to her right shoulder  down to her right elbow and deformity to the mid humerus. She has a moderate amount of swelling to her right hand.  Neurologic: No facial droop. Alert to voice and opens her eyes. Follow some commands. Limited motor exam to the right upper extremity due to injury. She is unable to fully cooperate with sensory testing. Skin:  Skin is warm, dry. Small abrasion right hand. Psychiatric: No agitation.   ____________________________________________  LABS (pertinent positives/negatives)  Labs Reviewed  CBC WITH DIFFERENTIAL/PLATELET - Abnormal; Notable for the following:       Result Value   WBC 15.8 (*)    Neutro Abs 13.0 (*)    Monocytes Absolute 1.0 (*)    All other components within normal limits  COMPREHENSIVE METABOLIC PANEL - Abnormal; Notable for the following:    Potassium 3.1 (*)    Glucose, Bld 217 (*)    BUN 32 (*)    Creatinine, Ser 1.36 (*)    Calcium 11.2 (*)    AST 50 (*)    GFR calc non Af Amer 35 (*)    GFR calc Af Amer 41 (*)    All other components within normal limits  URINALYSIS COMPLETEWITH MICROSCOPIC ([facility] ONLY) - Abnormal; Notable for the following:    Color, Urine YELLOW (*)    APPearance CLOUDY (*)    Glucose, UA 50 (*)    Protein, ur 30 (*)    Squamous Epithelial / LPF 0-5 (*)    All other components within normal limits  TROPONIN I     ____________________________________________    EKG I, Lisa Roca, MD, the attending physician have personally viewed and interpreted all ECGs.  None ____________________________________________  RADIOLOGY All Xrays were viewed by me. Imaging interpreted by Radiologist.  CT head and cervical spine noncontrast:  IMPRESSION: No evidence of acute intracranial abnormality. Atrophy with small vessel ischemic changes.  No evidence of traumatic injury to the cervical spine. Moderate degenerative changes at C5-6.  Left knee x-ray: Negative Right knee x-ray: Negative Right hand x-ray negative Chest x-ray portable:  IMPRESSION: No acute chest abnormality.  Displaced fracture of the right humeral neck.  Aortic atherosclerosis.  Pelvis x-ray: Negative  Right humerus:  FINDINGS: There is a fracture of the right humeral neck with medial displacement of the humeral shaft relative to the humeral head. There also likely is a fracture of the humeral head through the level of the greater tuberosity and the humeral head appears abnormally rotated.  IMPRESSION: Displaced fracture of the surgical neck of the proximal right humerus.    __________________________________________  PROCEDURES  Procedure(s) performed: None  Critical Care performed: None  ____________________________________________   ED COURSE / ASSESSMENT AND PLAN  Pertinent labs & imaging results that were available during my care of the patient were reviewed by me and considered in my medical decision making (see chart for details).   Ms. Langsam is brought in by her husband and after multiple falls, the recent fall on Friday has really rendered her unable to ambulate on her own and there is now severe swelling and ecchymosis to her right shoulder raising suspicion for underlying fracture.  Laboratory studies and urinalysis are not clearly consistent for metabolic cause of frequent falls. From a  traumatic perspective I did discuss with him obtaining CT head and cervical spine as well as imaging the right upper extremity as well as pelvis and knees.   On traumatic evaluation, only fracture found to be very displaced right humeral neck fracture. Discussed with  orthopedic surgeon, Dr. Marry Guan, who recommends dedicated shoulder films to help decide whether surgical and nonsurgical management is required. Patient will be admitted to the hospitalist service for acute fracture and pain management.    CONSULTATIONS:   Hospitalist for admission due to pain control and patient will likely need nursing home placement as she is unable to care for home. She is nonambulatory due to contusions and pain in her lower extremity is, and nonweightbearing to her right upper extremity now.   Patient / Family / Caregiver informed of clinical course, medical decision-making process, and agree with plan.  ___________________________________________   FINAL CLINICAL IMPRESSION(S) / ED DIAGNOSES   Final diagnoses:  Contusion of left knee, initial encounter  Contusion of right knee, initial encounter  Fracture of humerus neck, right, closed, initial encounter              Note: This dictation was prepared with Dragon dictation. Any transcriptional errors that result from this process are unintentional    Lisa Roca, MD 09/12/16 1844

## 2016-09-13 ENCOUNTER — Observation Stay: Payer: Medicare Other

## 2016-09-13 DIAGNOSIS — S4290XA Fracture of unspecified shoulder girdle, part unspecified, initial encounter for closed fracture: Secondary | ICD-10-CM | POA: Diagnosis present

## 2016-09-13 DIAGNOSIS — L8990 Pressure ulcer of unspecified site, unspecified stage: Secondary | ICD-10-CM | POA: Insufficient documentation

## 2016-09-13 LAB — COMPREHENSIVE METABOLIC PANEL
ALBUMIN: 3.4 g/dL — AB (ref 3.5–5.0)
ALK PHOS: 79 U/L (ref 38–126)
ALT: 24 U/L (ref 14–54)
ANION GAP: 7 (ref 5–15)
AST: 41 U/L (ref 15–41)
BILIRUBIN TOTAL: 1.5 mg/dL — AB (ref 0.3–1.2)
BUN: 21 mg/dL — ABNORMAL HIGH (ref 6–20)
CALCIUM: 10.4 mg/dL — AB (ref 8.9–10.3)
CO2: 25 mmol/L (ref 22–32)
Chloride: 112 mmol/L — ABNORMAL HIGH (ref 101–111)
Creatinine, Ser: 0.84 mg/dL (ref 0.44–1.00)
GLUCOSE: 112 mg/dL — AB (ref 65–99)
Potassium: 4.2 mmol/L (ref 3.5–5.1)
Sodium: 144 mmol/L (ref 135–145)
TOTAL PROTEIN: 6.4 g/dL — AB (ref 6.5–8.1)

## 2016-09-13 LAB — CBC
HEMATOCRIT: 40.9 % (ref 35.0–47.0)
HEMOGLOBIN: 13.4 g/dL (ref 12.0–16.0)
MCH: 30.9 pg (ref 26.0–34.0)
MCHC: 32.8 g/dL (ref 32.0–36.0)
MCV: 94.2 fL (ref 80.0–100.0)
Platelets: 259 10*3/uL (ref 150–440)
RBC: 4.35 MIL/uL (ref 3.80–5.20)
RDW: 13.9 % (ref 11.5–14.5)
WBC: 14.5 10*3/uL — ABNORMAL HIGH (ref 3.6–11.0)

## 2016-09-13 LAB — MAGNESIUM: Magnesium: 2.2 mg/dL (ref 1.7–2.4)

## 2016-09-13 LAB — PROTIME-INR
INR: 0.98
Prothrombin Time: 13 seconds (ref 11.4–15.2)

## 2016-09-13 LAB — SURGICAL PCR SCREEN
MRSA, PCR: NEGATIVE
Staphylococcus aureus: NEGATIVE

## 2016-09-13 MED ORDER — BISACODYL 5 MG PO TBEC: 5.0000 mg | DELAYED_RELEASE_TABLET | Freq: Every day | ORAL | Status: DC | PRN

## 2016-09-13 NOTE — Clinical Social Work Placement (Signed)
CLINICAL SOCIAL WORK PLACEMENT  NOTE  Date:  09/13/2016  Patient Details  Name: Deborah Espinoza MRN: [id number] Date of Birth: September 13, 1933  Clinical Social Work is seeking post-discharge placement for this patient at the Skilled  Nursing Facility level of care (*CSW will initial, date and re-position this form in  chart as items are completed):  Yes   Patient/family provided with [facility] Clinical Social Work Department's list of facilities offering this level of care within the geographic area requested by the patient (or if unable, by the patient's family).  Yes   Patient/family informed of their freedom to choose among providers that offer the needed level of care, that participate in Medicare, Medicaid or managed care program needed by the patient, have an available bed and are willing to accept the patient.  Yes   Patient/family informed of [facility]'s ownership interest in [facility] and [facility], as well as of the fact that they are under no obligation to receive care at these facilities.  PASRR submitted to EDS on 09/13/16     PASRR number received on 09/13/16     Existing PASRR number confirmed on       FL2 transmitted to all facilities in geographic area requested by pt/family on 09/13/16     FL2 transmitted to all facilities within larger geographic area on       Patient informed that his/her managed care company has contracts with or will negotiate with certain facilities, including the following:            Patient/family informed of bed offers received.  Patient chooses bed at       Physician recommends and patient chooses bed at      Patient to be transferred to   on  .  Patient to be transferred to facility by       Patient family notified on   of transfer.  Name of family member notified:        PHYSICIAN       Additional Comment:    _______________________________________________ Trinitee Horgan, Darleen Crocker, LCSW 09/13/2016, 2:13 PM

## 2016-09-13 NOTE — Progress Notes (Signed)
Pt. Is confused and unable to answer admission questions. Admission not complete.

## 2016-09-13 NOTE — Evaluation (Signed)
Physical Therapy Evaluation Patient Details Name: Deborah Espinoza MRN: [id number] DOB: 11-09-1932 Today's Date: 09/13/2016   History of Present Illness  Pt admitted for proximal humerus fracture on R UE. Pt also with distal phalax of R thumb nondisplaced fracture. Pt sustained fractures from fall at home. Pt with history of dementia, HTN, and hyperlipidemia. Sustained 2 falls last week. Sling applied prior to arrival.  Clinical Impression  Pt is a pleasant 82 year old female who was admitted for non surgical R humerus fracture, in sling at this time. Pt performs bed mobility, transfers, and ambulation with +2 assist with HHA from therapist. Pt limited in all mobility by pain, unable to participate in movement, needing total assist for care at this time. Pt is not safe to dc home, however not appropriate to participate in skilled therapy. Will need 24/7 total care at this time. Pt cries out in pain with all movement and is poor historian. Unsure of PLOF. Assume she was fairly mobile as far happened in home environment. Pt demonstrates deficits with cognition/strength/endurance/balance/mobility. Would benefit from skilled PT to address above deficits and promote optimal return to PLOF. Recommend HHPT for follow up at this time.      Follow Up Recommendations  (LTC with 24 hr assistance with HHPT)    Equipment Recommendations       Recommendations for Other Services       Precautions / Restrictions Precautions Precautions: Fall Restrictions Weight Bearing Restrictions: Yes RLE Weight Bearing: Non weight bearing      Mobility  Bed Mobility Overal bed mobility: +2 for physical assistance;Needs Assistance Bed Mobility: Supine to Sit     Supine to sit: Total assist;+2 for physical assistance     General bed mobility comments: pt unable to follow commands for sequencing or participation in therapy. +2 total assist required. Once seated at EOB, pt able to sit with decreased assist and  progressing to supervision.  Transfers Overall transfer level: Needs assistance Equipment used: 1 person hand held assist Transfers: Sit to/from Stand Sit to Stand: Total assist         General transfer comment: assist for standing with L UE pushing into therapist hands. 2nd person for balance. Pt with severe post leaning, unable to tolerate upright standing without heavy assist.  Ambulation/Gait Ambulation/Gait assistance: Max assist;+2 physical assistance Ambulation Distance (Feet): 1 Feet Assistive device: 1 person hand held assist Gait Pattern/deviations: Step-to pattern;Shuffle     General Gait Details: Attempted side stepping at EOB, however pt very rigid, unable to follow commands for stepping. Manual cues given for moving hips and feet towards HOB. Heavy post leaning noted.  Stairs            Wheelchair Mobility    Modified Rankin (Stroke Patients Only)       Balance Overall balance assessment: Needs assistance;History of Falls Sitting-balance support: Feet supported;Single extremity supported Sitting balance-Leahy Scale: Fair     Standing balance support: Single extremity supported Standing balance-Leahy Scale: Zero                               Pertinent Vitals/Pain Pain Assessment: Faces Faces Pain Scale: Hurts whole lot Pain Location: R shoulder, pt grabs at it and reports it hurts Pain Descriptors / Indicators: Aching;Discomfort Pain Intervention(s): Limited activity within patient's tolerance;Repositioned    Home Living Family/patient expects to be discharged to:: Private residence Living Arrangements: Spouse/significant other Available Help at Discharge: Family  Additional Comments: unsure as pt poor historian. No family in room.    Prior Function           Comments: TBD, pt unable to provide history     Hand Dominance        Extremity/Trunk Assessment   Upper Extremity Assessment: Generalized  weakness (L UE grossly 3/5; unable to follow commands, R UE not tested)           Lower Extremity Assessment: Generalized weakness (B LE grossly 3/5-unable to follow commands)         Communication   Communication: No difficulties  Cognition Arousal/Alertness: Awake/alert Behavior During Therapy: Restless Overall Cognitive Status: Difficult to assess                      General Comments      Exercises Other Exercises Other Exercises: attempted B LE ther-ex as well as ROM for R elbow. Pt very guarded and cries in pain with all touch. Pt keeps B knee flexed up towards chest. Unable to particiapte at this time.   Assessment/Plan    PT Assessment Patient needs continued PT services  PT Problem List Decreased strength;Decreased activity tolerance;Decreased balance;Decreased mobility;Pain;Decreased safety awareness;Decreased cognition          PT Treatment Interventions DME instruction;Gait training;Therapeutic exercise    PT Goals (Current goals can be found in the Care Plan section)  Acute Rehab PT Goals Patient Stated Goal: unable to state PT Goal Formulation: Patient unable to participate in goal setting Time For Goal Achievement: 09/27/16 Potential to Achieve Goals: Fair    Frequency 7X/week   Barriers to discharge        Co-evaluation               End of Session Equipment Utilized During Treatment: Gait belt Activity Tolerance: Patient limited by pain Patient left: in bed;with bed alarm set Nurse Communication: Mobility status    Functional Assessment Tool Used: clinical judgement' Functional Limitation: Mobility: Walking and moving around Mobility: Walking and Moving Around Current Status (W0981): At least 40 percent but less than 60 percent impaired, limited or restricted Mobility: Walking and Moving Around Goal Status [phone]): At least 1 percent but less than 20 percent impaired, limited or restricted    Time: 8295-6213 PT Time  Calculation (min) (ACUTE ONLY): 15 min   Charges:   PT Evaluation $PT Eval High Complexity: 1 Procedure     PT G Codes:   PT G-Codes **NOT FOR INPATIENT CLASS** Functional Assessment Tool Used: clinical judgement' Functional Limitation: Mobility: Walking and moving around Mobility: Walking and Moving Around Current Status (Y8657): At least 40 percent but less than 60 percent impaired, limited or restricted Mobility: Walking and Moving Around Goal Status [phone]): At least 1 percent but less than 20 percent impaired, limited or restricted    Avante Carneiro 09/13/2016, 3:05 PM  [name], PT, DPT [phone]

## 2016-09-13 NOTE — NC FL2 (Signed)
[place] LEVEL OF CARE SCREENING TOOL     IDENTIFICATION  Patient Name: Deborah Espinoza Birthdate: October 30, 1933 Sex: female Admission Date (Current Location): 09/12/2016  [place] and [place] Number:  Engineering geologist and Address:  [facility], [address] [place], [place] [address]      Provider Number: [id number]  Attending Physician Name and Address:  Demetrios Loll, MD  Relative Name and Phone Number:       Current Level of Care: Hospital Recommended Level of Care: [place] Prior Approval Number:    Date Approved/Denied:   PASRR Number:  [id number])  Discharge Plan: SNF    Current Diagnoses: Patient Active Problem List   Diagnosis Date Noted  . Shoulder fracture, right, closed, initial encounter 09/12/2016  . Hyperparathyroidism, primary ([place]) 05/17/2015  . Abnormal TSH 05/08/2014  . Leukocytosis, unspecified 05/08/2014  . Hypokalemia 11/21/2012  . hx: breast cancer, right, DCIS, receptor + 11/28/2011  . Neuropathy ([place]) 09/27/2011  . NECK SPRAIN AND STRAIN 11/06/2010  . VARICOSE VEINS LOWER EXTREMITIES W/OTH COMPS 05/05/2010  . HEMORRHOIDS, EXTERNAL 01/06/2010  . Dementia without behavioral disturbance 07/23/2009  . PERIPHERAL EDEMA 04/03/2009  . Essential hypertension 01/22/2009  . DECREASED HEARING 09/02/2008  . CONSTIPATION, CHRONIC 04/21/2008  . HYPERLIPIDEMIA 08/30/2007  . COLONIC POLYPS, HX OF 08/30/2007  . ALLERGIC RHINITIS 08/06/2007  . LICHEN NOS [id number]  . Osteoporosis 08/06/2007    Orientation RESPIRATION BLADDER Height & Weight     Self  Normal Continent Weight: 95 lb 11.2 oz (43.4 kg) Height:  5' (152.4 cm)  BEHAVIORAL SYMPTOMS/MOOD NEUROLOGICAL BOWEL NUTRITION STATUS   (None.)  (None.) Continent Diet (Diet: NPO)  AMBULATORY STATUS COMMUNICATION OF NEEDS Skin   Extensive Assist Verbally PU Stage and Appropriate Care (Stage 1: Mid Coccyx )                        Personal Care Assistance Level of Assistance  Bathing, Feeding, Dressing Bathing Assistance: Limited assistance Feeding assistance: Independent Dressing Assistance: Limited assistance     Functional Limitations Info  Sight, Hearing, Speech Sight Info: Adequate Hearing Info: Adequate Speech Info: Adequate    SPECIAL CARE FACTORS FREQUENCY  PT (By licensed PT), OT (By licensed OT)     PT Frequency:  (5) OT Frequency:  (5)            Contractures      Additional Factors Info  Code Status, Allergies Code Status Info:  (Full Code) Allergies Info:  ( Cephalexin, Clarithromycin, Codeine, Hydrocodone, Penicillins, Risedronate Sodium)           Current Medications (09/13/2016):  This is the current hospital active medication list Current Facility-Administered Medications  Medication Dose Route Frequency Provider Last Rate Last Dose  . 0.9 %  sodium chloride infusion   Intravenous Continuous Demetrios Loll, MD 75 mL/hr at 09/13/16 0844    . acetaminophen (TYLENOL) tablet 650 mg  650 mg Oral Q6H PRN Nicholes Mango, MD       Or  . acetaminophen (TYLENOL) suppository 650 mg  650 mg Rectal Q6H PRN Nicholes Mango, MD      . amLODipine (NORVASC) tablet 5 mg  5 mg Oral Daily Nicholes Mango, MD   5 mg at 09/12/16 2006  . donepezil (ARICEPT) tablet 20 mg  20 mg Oral QHS Nicholes Mango, MD   20 mg at 09/12/16 2226  . fentaNYL (SUBLIMAZE) injection 12.5 mcg  12.5 mcg Intravenous Q6H  PRN Nicholes Mango, MD   12.5 mcg at 09/13/16 0410  . fluconazole (DIFLUCAN) tablet 100 mg  100 mg Oral Daily Nicholes Mango, MD   100 mg at 09/12/16 1959  . ondansetron (ZOFRAN) tablet 4 mg  4 mg Oral Q6H PRN Nicholes Mango, MD       Or  . ondansetron (ZOFRAN) injection 4 mg  4 mg Intravenous Q6H PRN Aruna Gouru, MD      . senna-docusate (Senokot-S) tablet 1 tablet  1 tablet Oral QHS PRN Nicholes Mango, MD      . traMADol (ULTRAM) tablet 50 mg  50 mg Oral Q6H PRN Nicholes Mango, MD         Discharge Medications: Please see  discharge summary for a list of discharge medications.  Relevant Imaging Results:  Relevant Lab Results:   Additional Information  (SSN: [id number])  Danie Chandler, Student-Social Work

## 2016-09-13 NOTE — Clinical Social Work Note (Addendum)
Clinical Social Work Assessment  Patient Details  Name: Deborah Espinoza MRN: [id number] Date of Birth: 08-27-1933  Date of referral:  09/13/16               Reason for consult:  Facility Placement, Discharge Planning                Permission sought to share information with:  Chartered certified accountant granted to share information::  Yes, Verbal Permission Granted  Name::      Deborah Espinoza::   [place]   Relationship::     Contact Information:     Housing/Transportation Living arrangements for the past 2 months:  Single Family Home Source of Information:  Spouse Patient Interpreter Needed:  None Criminal Activity/Legal Involvement Pertinent to Current Situation/Hospitalization:  No - Comment as needed Significant Relationships:  Adult Children, Spouse Lives with:  Spouse Do you feel safe going back to the place where you live?  Yes Need for family participation in patient care:  Yes (Comment)  Care giving concerns:  Patient lives with her husband Deborah Espinoza in [place].    Social Worker assessment / plan:  Holiday representative (CSW) received verbal consult from RN case manager that patient's husband was interested in patient going to a SNF for short-term rehab. Patient has not worked with PT at this time. Patient was laying in bed asleep. Social work Theatre manager spoke with patient's husband. Per patient's husband, patient lives with him in [place] and has two adult children, one son that lives in [place] and a daughter that lives in [place]. Patient's husband is preferring patient to go to a SNF for short-term rehab as he does not feel comfortable taking care of patient at home. Social work Theatre manager explained that PT will work with patient and determine if patient is appropriate for SNF placement. Social work Theatre manager explained to patient's husband that with patient's insurance [employer] requires authorization for SNF. Patient's  husband said he understood.   Fl2 completed and faxed out.  Per PT patient is not following commonds and is not rehab appropriate for SNF at this time. CSW attempted to meet with patient's husband Deborah Espinoza to make him aware of above however he was not at bedside. CSW left patient's husband a Advertising account executive. RN case manager is aware of above. CSW will continue to follow and assist as needed.    Employment status:  Retired Nurse, adult PT Recommendations:  Not assessed at this time Information / Referral to community resources:  [place]  Patient/Family's Response to care:  Patient's husband is preferring patient to go to a SNF for rehab.  Patient/Family's Understanding of and Emotional Response to Diagnosis, Current Treatment, and Prognosis:  Patient was asleep, but husband is concerned he cannot take care of patient properly if patient was sent home. Patient's husband was pleasant and thanked social work Theatre manager for coming by.   Emotional Assessment Appearance:  Appears stated age Attitude/Demeanor/Rapport:    Affect (typically observed):  Unable to Assess Orientation:  Oriented to Self Alcohol / Substance use:  Not Applicable Psych involvement (Current and /or in the community):  No (Comment)  Discharge Needs  Concerns to be addressed:  Basic Needs, Discharge Planning Concerns Readmission within the last 30 days:  No Current discharge risk:  Dependent with Mobility Barriers to Discharge:  Continued Medical Work up   [employer], Student-Social Work 09/13/2016, 12:05 PM

## 2016-09-13 NOTE — Progress Notes (Signed)
Initial Nutrition Assessment  DOCUMENTATION CODES:   Severe malnutrition in context of chronic illness  INTERVENTION:  1. Ensure Enlive po BID, each supplement provides 350 kcal and 20 grams of protein w/ diet advancement  NUTRITION DIAGNOSIS:   Malnutrition related to chronic illness as evidenced by severe depletion of muscle mass, severe depletion of body fat.  GOAL:   Patient will meet greater than or equal to 90% of their needs  MONITOR:   PO intake, I & O's, Labs, Weight trends, Supplement acceptance  REASON FOR ASSESSMENT:   Other (Comment) (Low BMI)    ASSESSMENT:   Deborah Espinoza  is a 82 y.o. female with a known history of Dementia, hypertension and hyperlipidemia is brought into the emergency department by her husband after she sustained a fall.   Spoke with Deborah Espinoza's Husband at bedside. He states patient eats good sometimes, eat poorly other times. He was unsure of her weight history, per chart she exhibits a 4#/4.2% insignificant wt loss over 1 month. She is currently NPO, awaiting possible surgery on her shoulder fracture. Husband denies any issues with chewing/swallowing or choking  Nutrition-Focused physical exam completed. Findings are moderate-severe fat depletion, moderate-severe muscle depletion, and no edema.   Labs and medications reviewed: NS @ 41mL/hr  Diet Order:  Diet NPO time specified  Skin:  Wound (see comment) (Stg I to coccyx)  Last BM:  PTA  Height:   Ht Readings from Last 1 Encounters:  09/12/16 5' (1.524 m)    Weight:   Wt Readings from Last 1 Encounters:  09/12/16 95 lb 11.2 oz (43.4 kg)    Ideal Body Weight:  45.45 kg  BMI:  Body mass index is 18.69 kg/m.  Estimated Nutritional Needs:   Kcal:  1080-1300  Protein:  51-63 gm  Fluid:  >/= 1.1L  EDUCATION NEEDS:   No education needs identified at this time  Satira Anis. Sophonie Goforth, MS, RD LDN Inpatient Clinical Dietitian Pager [phone]

## 2016-09-13 NOTE — Care Management CC44 (Signed)
Condition Code 44 Documentation Completed  Patient Details  Name: SHAWNEE PENICK MRN: [id number] Date of Birth: 12-15-1932   Condition Code 44 given:    Patient signature on Condition Code 44 notice:    Documentation of 2 MD's agreement:    Code 44 added to claim:       Jolly Mango, RN 09/13/2016, 11:35 AM

## 2016-09-13 NOTE — Consult Note (Signed)
ORTHOPAEDIC CONSULTATION  PATIENT NAME: Deborah Espinoza DOB: 11/03/1933  MRN: [id number]  REQUESTING PHYSICIAN: Demetrios Loll, MD  Chief Complaint: Multiple falls and right shoulder pain  HPI: Deborah Espinoza is a 82 y.o. female who sustained 2 falls last week, most recently being Friday. She was noted to have significant swelling and bruising to the right shoulder and arm. She presented to [facility] emergency room at which time radiographs demonstrated a displaced right proximal humerus fracture. There was no apparent loss of consciousness. The patient does have a history of progressive dementia.  Past Medical History:  Diagnosis Date  . Allergic rhinitis, cause unspecified   . Breast cancer ([place])    right  . Cataracts, bilateral    complications from eye surgery  . Dizziness and giddiness   . Edema   . Elevated blood pressure reading without diagnosis of hypertension   . External hemorrhoids without mention of complication   . GERD (gastroesophageal reflux disease)   . Lichen, unspecified    sclerosis  . Memory loss   . Mixed hyperlipidemia   . Osteoporosis, unspecified   . Other constipation   . Personal history of colonic polyps   . Sprain of neck   . Unspecified constipation   . Unspecified essential hypertension   . Unspecified hearing loss   . Urticaria, unspecified   . Varicose veins of lower extremities with other complications    Past Surgical History:  Procedure Laterality Date  . BILATERAL OOPHORECTOMY     bilateral  . BREAST BIOPSY  12/07   pos ductal carcinoma in situ  . BREAST LUMPECTOMY  2008   RIGHT  . CATARACT EXTRACTION  04/2003  . COLONOSCOPY  11/02   polyp  . COLONOSCOPY  09/2004   polyps  . COLONOSCOPY  11/08   adenomatous polyps  . DEXA  2001   Osteopenia  . DEXA     improved OP  . DEXA  11/2004   OP  . DEXA  2/09   OP T score-3.0 spine and -2.5 in fem neck  . nuclear stress  05/2007   negative  . ovarian tumor   11/04   benign  . PARTIAL HYSTERECTOMY  1987   fibroids  . stress perfusion cardiac study  7/22008   Normal with normal EF   Social History   Social History  . Marital status: Married    Spouse name: N/A  . Number of children: N/A  . Years of education: N/A   Social History Main Topics  . Smoking status: Never Smoker  . Smokeless tobacco: Never Used  . Alcohol use No  . Drug use: No  . Sexual activity: Not Asked   Other Topics Concern  . None   Social History Narrative  . None   Family History  Problem Relation Age of Onset  . Brain cancer Father   . Diabetes Father   . Kidney cancer Mother     renal cell  . Cancer Mother   . Diabetes Brother   . Diabetes Brother   . Colon cancer Brother   . Alzheimer's disease      Aunt   Allergies  Allergen Reactions  . Cephalexin   . Clarithromycin   . Codeine   . Hydrocodone   . Penicillins   . Risedronate Sodium    Prior to Admission medications   Medication Sig Start Date End Date Taking? Authorizing Provider  amLODipine (NORVASC) 5 MG tablet Take 1 tablet (  5 mg total) by mouth daily. 08/08/16  Yes Abner Greenspan, MD  Cholecalciferol (VITAMIN D PO) Take 1 tablet by mouth daily.   Yes Historical Provider, MD  donepezil (ARICEPT) 10 MG tablet Take 2 tablets (20 mg total) by mouth at bedtime as needed. 08/08/16  Yes Abner Greenspan, MD  Multiple Vitamins-Minerals (PRESERVISION AREDS PO) Take 1 tablet by mouth daily.   Yes Historical Provider, MD   Dg Pelvis 1-2 Views  Result Date: 09/12/2016 CLINICAL DATA:  Bilateral hip pain after falls. EXAM: PELVIS - 1-2 VIEW COMPARISON:  None.- FINDINGS: Pelvic bony ring is intact. No gross abnormality to the hips. There is gas in the rectum. No evidence for a fracture. IMPRESSION: No acute bone abnormality. Electronically Signed   By: Markus Daft M.D.   On: 09/12/2016 16:53   Dg Shoulder Right  Result Date: 09/12/2016 CLINICAL DATA:  Right shoulder pain since a fall out of bed 3 days  ago. EXAM: RIGHT SHOULDER - 2+ VIEW COMPARISON:  None. FINDINGS: The patient has an acute surgical neck fracture of the right shoulder with foreshortening of approximately 3.5 cm and 1 shaft with anterior displacement. The fracture appears to involve the greater tuberosity. The humeral head remains located. The acromioclavicular joint is intact. No other acute abnormality is identified. IMPRESSION: Acute surgical neck fracture right humerus as described above. Electronically Signed   By: Inge Rise M.D.   On: 09/12/2016 19:08   Dg Knee 1-2 Views Left  Result Date: 09/12/2016 CLINICAL DATA:  Fall. EXAM: LEFT KNEE - 1-2 VIEW COMPARISON:  None. FINDINGS: No evidence of fracture, dislocation, or joint effusion. No evidence of arthropathy or other focal bone abnormality. Soft tissues are unremarkable. IMPRESSION: Negative. Electronically Signed   By: Aletta Edouard M.D.   On: 09/12/2016 16:53   Ct Head Wo Contrast  Result Date: 09/12/2016 CLINICAL DATA:  Dementia, multiple falls x4 days, altered mental status EXAM: CT HEAD WITHOUT CONTRAST CT CERVICAL SPINE WITHOUT CONTRAST TECHNIQUE: Multidetector CT imaging of the head and cervical spine was performed following the standard protocol without intravenous contrast. Multiplanar CT image reconstructions of the cervical spine were also generated. COMPARISON:  Cervical spine radiographs dated 11/09/2010 FINDINGS: CT HEAD FINDINGS Brain: No evidence of acute infarction, hemorrhage, hydrocephalus, extra-axial collection or mass lesion/mass effect. Vascular: No hyperdense vessel or unexpected calcification. Skull: Normal. Negative for fracture or focal lesion. Sinuses/Orbits: Visualized paranasal sinuses are essentially clear. Right mastoid air cells are opacified. Other: Global cortical atrophy.  No ventriculomegaly. Subcortical white matter and periventricular small vessel ischemic changes. Mild intracranial atherosclerosis. CT CERVICAL SPINE FINDINGS Alignment:  Normal cervical lordosis. Skull base and vertebrae: No acute fracture. No primary bone lesion or focal pathologic process. Soft tissues and spinal canal: No prevertebral fluid or swelling. No visible canal hematoma. Disc levels:  Moderate degenerative changes at C5-6. Spinal canal remains patent. Upper chest: Visualized lung apices are notable for biapical pleural-parenchymal scarring. Other: Visualized thyroid is unremarkable. IMPRESSION: No evidence of acute intracranial abnormality. Atrophy with small vessel ischemic changes. No evidence of traumatic injury to the cervical spine. Moderate degenerative changes at C5-6. Electronically Signed   By: Julian Hy M.D.   On: 09/12/2016 16:10   Ct Cervical Spine Wo Contrast  Result Date: 09/12/2016 CLINICAL DATA:  Dementia, multiple falls x4 days, altered mental status EXAM: CT HEAD WITHOUT CONTRAST CT CERVICAL SPINE WITHOUT CONTRAST TECHNIQUE: Multidetector CT imaging of the head and cervical spine was performed following the standard protocol without intravenous  contrast. Multiplanar CT image reconstructions of the cervical spine were also generated. COMPARISON:  Cervical spine radiographs dated 11/09/2010 FINDINGS: CT HEAD FINDINGS Brain: No evidence of acute infarction, hemorrhage, hydrocephalus, extra-axial collection or mass lesion/mass effect. Vascular: No hyperdense vessel or unexpected calcification. Skull: Normal. Negative for fracture or focal lesion. Sinuses/Orbits: Visualized paranasal sinuses are essentially clear. Right mastoid air cells are opacified. Other: Global cortical atrophy.  No ventriculomegaly. Subcortical white matter and periventricular small vessel ischemic changes. Mild intracranial atherosclerosis. CT CERVICAL SPINE FINDINGS Alignment: Normal cervical lordosis. Skull base and vertebrae: No acute fracture. No primary bone lesion or focal pathologic process. Soft tissues and spinal canal: No prevertebral fluid or swelling. No  visible canal hematoma. Disc levels:  Moderate degenerative changes at C5-6. Spinal canal remains patent. Upper chest: Visualized lung apices are notable for biapical pleural-parenchymal scarring. Other: Visualized thyroid is unremarkable. IMPRESSION: No evidence of acute intracranial abnormality. Atrophy with small vessel ischemic changes. No evidence of traumatic injury to the cervical spine. Moderate degenerative changes at C5-6. Electronically Signed   By: Julian Hy M.D.   On: 09/12/2016 16:10   Ct Shoulder Right Wo Contrast  Result Date: 09/13/2016 CLINICAL DATA:  Status post fall 4 days ago with a right humerus fracture. Preoperative planning examination. EXAM: CT OF THE RIGHT SHOULDER WITHOUT CONTRAST TECHNIQUE: Multidetector CT imaging was performed according to the standard protocol. Multiplanar CT image reconstructions were also generated. COMPARISON:  Plain films right shoulder 09/12/2016. PA and lateral chest 05/08/2014. FINDINGS: As seen on the comparison plain films, the patient has a fracture of the surgical neck of the right humerus. The shaft of the humerus demonstrates anterior displacement of approximately 4.2 cm. The humeral head is rotated with the greater tuberosity directed toward the superior margin of the glenoid. There is fragment override of approximately 2 cm. The fracture extends through the greater tuberosity most notable posteriorly where there is distraction of approximately 0.7 cm and mild comminution. The lesser tuberosity is spared. The humeral head remains located. The acromioclavicular joint is intact and unremarkable. The rotator cuff of and long head of biceps tendon appear intact. The patient has a severe compression fracture deformity of T7. Milder compression fracture of T8 is also identified. These fractures are remote and seen on the comparison plain films of the chest. Aortic atherosclerosis is identified. Imaged lung parenchyma is clear. IMPRESSION: Acute  surgical neck fracture of the humerus with anterior displacement of approximately 4.2 cm and fragment override of approximately 2 cm. The fracture extends into the greater tuberosity where there is mild distraction and comminution. As noted above, the humeral head is rotated due to retraction by the rotator cuff with the greater tuberosity directed toward the glenoid. Remote T7 and T8 compression fractures. Calcific aortic atherosclerosis. Electronically Signed   By: Inge Rise M.D.   On: 09/13/2016 13:16   Dg Hand 2 View Right  Result Date: 09/12/2016 CLINICAL DATA:  82 year old female with a history of fall. Arm pain. EXAM: RIGHT HAND - 2 VIEW COMPARISON:  None. FINDINGS: Osteopenia. Acute nondisplaced fracture at the base of the distal phalanx of the right thumb. Advanced osteoarthritis of the right hand.  No erosive changes. No radiopaque foreign body. IMPRESSION: Acute nondisplaced fracture at the base of the distal phalanx of the right thumb. Osteopenia and advanced osteoarthritis. Signed, Dulcy Fanny. Earleen Newport, DO [facility] Electronically Signed   By: Corrie Mckusick D.O.   On: 09/12/2016 16:52   Dg Chest  Port 1 View  Result Date: 09/12/2016 CLINICAL DATA:  Multiple falls.  Humerus fracture. EXAM: PORTABLE CHEST 1 VIEW COMPARISON:  Right humerus 09/22/2016 and chest radiography 05/08/2014 FINDINGS: There is a displaced fracture of the right humeral neck. The distal humeral component is displaced medially. Right AC joint appears to be intact. Both lungs are clear. Negative for pneumothorax. Atherosclerotic calcifications at the aortic arch. Heart size is within normal limits. IMPRESSION: No acute chest abnormality. Displaced fracture of the right humeral neck. Aortic atherosclerosis. Electronically Signed   By: Markus Daft M.D.   On: 09/12/2016 17:04   Dg Humerus Right  Result Date: 09/12/2016 CLINICAL DATA:  Fall with right arm injury.   Initial encounter. EXAM: RIGHT HUMERUS - 2+ VIEW COMPARISON:  None. FINDINGS: There is a fracture of the right humeral neck with medial displacement of the humeral shaft relative to the humeral head. There also likely is a fracture of the humeral head through the level of the greater tuberosity and the humeral head appears abnormally rotated. IMPRESSION: Displaced fracture of the surgical neck of the proximal right humerus. Electronically Signed   By: Aletta Edouard M.D.   On: 09/12/2016 16:52   Dg Knee 2 Views Right  Result Date: 09/12/2016 CLINICAL DATA:  82 year old female with a history of dementia and fall. EXAM: RIGHT KNEE - 3 VIEW COMPARISON:  None. FINDINGS: No acute displaced fracture. No focal soft tissue swelling.  No joint effusion. Osteopenia. IMPRESSION: No acute bony abnormality. Signed, Dulcy Fanny. Earleen Newport, DO [facility] Electronically Signed   By: Corrie Mckusick D.O.   On: 09/12/2016 16:53    Positive ROS: All other systems have been reviewed and were otherwise negative with the exception of those mentioned in the HPI and as above.  Physical Exam: General: Lethargic but arousable. She is in no acute distress. HEENT: Atraumatic and normocephalic. Sclera are clear. Extraocular motion is intact. Oropharynx is clear with moist mucosa. Neck: Supple, nontender, good range of motion. No JVD or carotid bruits. Lungs: Clear to auscultation bilaterally. Cardiovascular: Regular rate and rhythm with normal S1 and S2. No murmurs. No gallops or rubs. Pedal pulses are palpable bilaterally. Homans test is negative bilaterally. No significant pretibial or ankle edema. Abdomen: Soft, nontender, and nondistended. Bowel sounds are present. Skin: Ecchymosis to the right upper extremity. Her is also an eschar to the first webspace of the right hand without gross erythema or drainage. Neurologic: Awake, but not oriented. Sensory function is grossly  intact. Motor strength is felt to be 5 over 5 bilaterally with the exception of the right upper extremity which was not assessed secondary to the injury.. No gross clonus or tremor. Motor coordination was difficult to assess. Lymphatic: No axillary or cervical lymphadenopathy  MUSCULOSKELETAL: Examination is pertinent for findings involving the right upper extremity. There is an extensive amount of ecchymosis and soft tissue swelling extending from the right shoulder down the upper arm and into the forearm. Some crepitance is noted with attempted range of motion of the right shoulder. The skin is intact. No significant tenderness to palpation about the elbow and wrist. There is some point tenderness at the IP joint of the right thumb, but no distinct deformity is appreciated. Good capillary refill.  Assessment: Displaced right proximal humerus fracture Nondisplaced fracture of the base of the right thumb distal phalanx  Plan: The findings were discussed in detail with the patient's husband and son. We discussed treatment options including reverse shoulder arthroplasty  versus palliative care. The family was given informational material about the shoulder fractures and reverse shoulder arthroplasty. The risks and benefits of surgical intervention were reviewed. They would like to take this information under advisement and let me know of their decision tomorrow.  I have discussed the patient's status with Dr. Elenore Rota. I will ask him to assume her care if the family elects to proceed with surgical intervention.  Izaiyah Kleinman P. Holley Bouche M.D.

## 2016-09-13 NOTE — Progress Notes (Signed)
Clinical Education officer, museum (CSW) met with patient's husband at bedside and explained that patient was not able to work with PT and that her insurance [employer] will not pay for SNF. CSW discussed private pay for SNF and home health. Husband reported that he cannot care for patient at home and in the [facility] book he has it says [facility] will pay for 20 days at a SNF at 100%. CSW explained to husband that [facility] would pay if patient met criteria for rehab at a SNF however she is not able to work with PT. Husband asked CSW to look into private pay rates for the SNF's in [place]. Blue [employer] authorization was started. CSW will continue to follow and assist as needed.   [employer], LCSW [phone]

## 2016-09-13 NOTE — Progress Notes (Signed)
Shoulder sling placed per order. Ice to effected right side shoulder. Spouse set up password.

## 2016-09-13 NOTE — Consult Note (Signed)
ORTHOPAEDICS: Patient evaluated and x-rays reviewed. Displaced right proximal humerus fracture is noted. Significant ecchymosis to the right shoulder and arm. Orders placed for sling to the right upper extremity as well as use of ice to the shoulder to decrease swelling. Patient has dementia and no family members are present.  Full note to follow after speaking with the family concerning treatment options.  James P. Holley Bouche M.D.

## 2016-09-13 NOTE — Progress Notes (Signed)
Clinical Education officer, museum (CSW) met with patient and her son and husband were at bedside. CSW presented private pay bed offers. CSW explained long term care options to son. Per son and husband they will review offers tonight and get back to CSW tomorrow. Plan is for patient to go to SNF private pay. CSW will continue to follow and assist as needed.   [employer], LCSW [phone]

## 2016-09-13 NOTE — Progress Notes (Signed)
[place] [place] NAME: Deborah Espinoza    MR#:  [id number]  DATE OF BIRTH:  September 20, 1933  SUBJECTIVE:  CHIEF COMPLAINT:   Chief Complaint  Patient presents with  . Fall   The patient is demented, noncommunicative. REVIEW OF SYSTEMS:  Review of Systems  Unable to perform ROS: Dementia    DRUG ALLERGIES:   Allergies  Allergen Reactions  . Cephalexin   . Clarithromycin   . Codeine   . Hydrocodone   . Penicillins   . Risedronate Sodium    VITALS:  Blood pressure (!) 166/62, pulse 65, temperature 98.4 F (36.9 C), temperature source Axillary, resp. rate 17, height 5' (1.524 m), weight 95 lb 11.2 oz (43.4 kg), SpO2 98 %. PHYSICAL EXAMINATION:  Physical Exam  Constitutional: No distress.  Malnutrition.  Eyes: Conjunctivae and EOM are normal.  Neck: Normal range of motion. Neck supple. No JVD present. No tracheal deviation present.  Cardiovascular: Normal rate, regular rhythm and normal heart sounds.  Exam reveals no gallop.   No murmur heard. Pulmonary/Chest: Effort normal and breath sounds normal. No respiratory distress. She has no wheezes.  Abdominal: Soft. Bowel sounds are normal. She exhibits no distension. There is no tenderness.  Musculoskeletal: She exhibits no edema or tenderness.  Neurological:  The patient is demented, unable to exam.  Skin: No rash noted. No erythema.  Psychiatric:  Demented, noncommunicative.   LABORATORY PANEL:   CBC  Recent Labs Lab 09/13/16 0452  WBC 14.5*  HGB 13.4  HCT 40.9  PLT 259   ------------------------------------------------------------------------------------------------------------------ Chemistries   Recent Labs Lab 09/13/16 0452  NA 144  K 4.2  CL 112*  CO2 25  GLUCOSE 112*  BUN 21*  CREATININE 0.84  CALCIUM 10.4*  MG 2.2  AST 41  ALT 24  ALKPHOS 79  BILITOT 1.5*   RADIOLOGY:  Dg Pelvis 1-2 Views  Result Date: 09/12/2016 CLINICAL DATA:  Bilateral  hip pain after falls. EXAM: PELVIS - 1-2 VIEW COMPARISON:  None.- FINDINGS: Pelvic bony ring is intact. No gross abnormality to the hips. There is gas in the rectum. No evidence for a fracture. IMPRESSION: No acute bone abnormality. Electronically Signed   By: Markus Daft M.D.   On: 09/12/2016 16:53   Dg Shoulder Right  Result Date: 09/12/2016 CLINICAL DATA:  Right shoulder pain since a fall out of bed 3 days ago. EXAM: RIGHT SHOULDER - 2+ VIEW COMPARISON:  None. FINDINGS: The patient has an acute surgical neck fracture of the right shoulder with foreshortening of approximately 3.5 cm and 1 shaft with anterior displacement. The fracture appears to involve the greater tuberosity. The humeral head remains located. The acromioclavicular joint is intact. No other acute abnormality is identified. IMPRESSION: Acute surgical neck fracture right humerus as described above. Electronically Signed   By: Inge Rise M.D.   On: 09/12/2016 19:08   Dg Knee 1-2 Views Left  Result Date: 09/12/2016 CLINICAL DATA:  Fall. EXAM: LEFT KNEE - 1-2 VIEW COMPARISON:  None. FINDINGS: No evidence of fracture, dislocation, or joint effusion. No evidence of arthropathy or other focal bone abnormality. Soft tissues are unremarkable. IMPRESSION: Negative. Electronically Signed   By: Aletta Edouard M.D.   On: 09/12/2016 16:53   Ct Head Wo Contrast  Result Date: 09/12/2016 CLINICAL DATA:  Dementia, multiple falls x4 days, altered mental status EXAM: CT HEAD WITHOUT CONTRAST CT CERVICAL SPINE WITHOUT CONTRAST TECHNIQUE: Multidetector CT imaging of the head and cervical  spine was performed following the standard protocol without intravenous contrast. Multiplanar CT image reconstructions of the cervical spine were also generated. COMPARISON:  Cervical spine radiographs dated 11/09/2010 FINDINGS: CT HEAD FINDINGS Brain: No evidence of acute infarction, hemorrhage, hydrocephalus, extra-axial collection or mass lesion/mass effect.  Vascular: No hyperdense vessel or unexpected calcification. Skull: Normal. Negative for fracture or focal lesion. Sinuses/Orbits: Visualized paranasal sinuses are essentially clear. Right mastoid air cells are opacified. Other: Global cortical atrophy.  No ventriculomegaly. Subcortical white matter and periventricular small vessel ischemic changes. Mild intracranial atherosclerosis. CT CERVICAL SPINE FINDINGS Alignment: Normal cervical lordosis. Skull base and vertebrae: No acute fracture. No primary bone lesion or focal pathologic process. Soft tissues and spinal canal: No prevertebral fluid or swelling. No visible canal hematoma. Disc levels:  Moderate degenerative changes at C5-6. Spinal canal remains patent. Upper chest: Visualized lung apices are notable for biapical pleural-parenchymal scarring. Other: Visualized thyroid is unremarkable. IMPRESSION: No evidence of acute intracranial abnormality. Atrophy with small vessel ischemic changes. No evidence of traumatic injury to the cervical spine. Moderate degenerative changes at C5-6. Electronically Signed   By: Julian Hy M.D.   On: 09/12/2016 16:10   Ct Cervical Spine Wo Contrast  Result Date: 09/12/2016 CLINICAL DATA:  Dementia, multiple falls x4 days, altered mental status EXAM: CT HEAD WITHOUT CONTRAST CT CERVICAL SPINE WITHOUT CONTRAST TECHNIQUE: Multidetector CT imaging of the head and cervical spine was performed following the standard protocol without intravenous contrast. Multiplanar CT image reconstructions of the cervical spine were also generated. COMPARISON:  Cervical spine radiographs dated 11/09/2010 FINDINGS: CT HEAD FINDINGS Brain: No evidence of acute infarction, hemorrhage, hydrocephalus, extra-axial collection or mass lesion/mass effect. Vascular: No hyperdense vessel or unexpected calcification. Skull: Normal. Negative for fracture or focal lesion. Sinuses/Orbits: Visualized paranasal sinuses are essentially clear. Right mastoid  air cells are opacified. Other: Global cortical atrophy.  No ventriculomegaly. Subcortical white matter and periventricular small vessel ischemic changes. Mild intracranial atherosclerosis. CT CERVICAL SPINE FINDINGS Alignment: Normal cervical lordosis. Skull base and vertebrae: No acute fracture. No primary bone lesion or focal pathologic process. Soft tissues and spinal canal: No prevertebral fluid or swelling. No visible canal hematoma. Disc levels:  Moderate degenerative changes at C5-6. Spinal canal remains patent. Upper chest: Visualized lung apices are notable for biapical pleural-parenchymal scarring. Other: Visualized thyroid is unremarkable. IMPRESSION: No evidence of acute intracranial abnormality. Atrophy with small vessel ischemic changes. No evidence of traumatic injury to the cervical spine. Moderate degenerative changes at C5-6. Electronically Signed   By: Julian Hy M.D.   On: 09/12/2016 16:10   Ct Shoulder Right Wo Contrast  Result Date: 09/13/2016 CLINICAL DATA:  Status post fall 4 days ago with a right humerus fracture. Preoperative planning examination. EXAM: CT OF THE RIGHT SHOULDER WITHOUT CONTRAST TECHNIQUE: Multidetector CT imaging was performed according to the standard protocol. Multiplanar CT image reconstructions were also generated. COMPARISON:  Plain films right shoulder 09/12/2016. PA and lateral chest 05/08/2014. FINDINGS: As seen on the comparison plain films, the patient has a fracture of the surgical neck of the right humerus. The shaft of the humerus demonstrates anterior displacement of approximately 4.2 cm. The humeral head is rotated with the greater tuberosity directed toward the superior margin of the glenoid. There is fragment override of approximately 2 cm. The fracture extends through the greater tuberosity most notable posteriorly where there is distraction of approximately 0.7 cm and mild comminution. The lesser tuberosity is spared. The humeral head remains  located. The acromioclavicular joint  is intact and unremarkable. The rotator cuff of and long head of biceps tendon appear intact. The patient has a severe compression fracture deformity of T7. Milder compression fracture of T8 is also identified. These fractures are remote and seen on the comparison plain films of the chest. Aortic atherosclerosis is identified. Imaged lung parenchyma is clear. IMPRESSION: Acute surgical neck fracture of the humerus with anterior displacement of approximately 4.2 cm and fragment override of approximately 2 cm. The fracture extends into the greater tuberosity where there is mild distraction and comminution. As noted above, the humeral head is rotated due to retraction by the rotator cuff with the greater tuberosity directed toward the glenoid. Remote T7 and T8 compression fractures. Calcific aortic atherosclerosis. Electronically Signed   By: Inge Rise M.D.   On: 09/13/2016 13:16   Dg Hand 2 View Right  Result Date: 09/12/2016 CLINICAL DATA:  82 year old female with a history of fall. Arm pain. EXAM: RIGHT HAND - 2 VIEW COMPARISON:  None. FINDINGS: Osteopenia. Acute nondisplaced fracture at the base of the distal phalanx of the right thumb. Advanced osteoarthritis of the right hand.  No erosive changes. No radiopaque foreign body. IMPRESSION: Acute nondisplaced fracture at the base of the distal phalanx of the right thumb. Osteopenia and advanced osteoarthritis. Signed, Dulcy Fanny. Earleen Newport, DO [facility] Electronically Signed   By: Corrie Mckusick D.O.   On: 09/12/2016 16:52   Dg Chest Port 1 View  Result Date: 09/12/2016 CLINICAL DATA:  Multiple falls.  Humerus fracture. EXAM: PORTABLE CHEST 1 VIEW COMPARISON:  Right humerus 09/22/2016 and chest radiography 05/08/2014 FINDINGS: There is a displaced fracture of the right humeral neck. The distal humeral component is displaced medially. Right AC joint appears to be  intact. Both lungs are clear. Negative for pneumothorax. Atherosclerotic calcifications at the aortic arch. Heart size is within normal limits. IMPRESSION: No acute chest abnormality. Displaced fracture of the right humeral neck. Aortic atherosclerosis. Electronically Signed   By: Markus Daft M.D.   On: 09/12/2016 17:04   Dg Humerus Right  Result Date: 09/12/2016 CLINICAL DATA:  Fall with right arm injury.  Initial encounter. EXAM: RIGHT HUMERUS - 2+ VIEW COMPARISON:  None. FINDINGS: There is a fracture of the right humeral neck with medial displacement of the humeral shaft relative to the humeral head. There also likely is a fracture of the humeral head through the level of the greater tuberosity and the humeral head appears abnormally rotated. IMPRESSION: Displaced fracture of the surgical neck of the proximal right humerus. Electronically Signed   By: Aletta Edouard M.D.   On: 09/12/2016 16:52   Dg Knee 2 Views Right  Result Date: 09/12/2016 CLINICAL DATA:  82 year old female with a history of dementia and fall. EXAM: RIGHT KNEE - 3 VIEW COMPARISON:  None. FINDINGS: No acute displaced fracture. No focal soft tissue swelling.  No joint effusion. Osteopenia. IMPRESSION: No acute bony abnormality. Signed, Dulcy Fanny. Earleen Newport, DO [facility] Radiology Electronically Signed   By: Corrie Mckusick D.O.   On: 09/12/2016 16:53   ASSESSMENT AND PLAN:   Deborah Espinoza  is a 82 y.o. female with a known history of Dementia, hypertension and hyperlipidemia is brought into the emergency department by her husband after she sustained a fall. According to the husband patient was having more wandering behavior lately and had 2 falls in the past one week. Patient had 1 fall on last Wednesday and underwent on Friday.  Patient was complaining of right hand pain and has multiple bruises and ecchymosis and unable to ambulate  # Acute right surgical neck fracture of the humerus from  the recent fall Pain management as needed Nothing by mouthfor possible surgery. Dr. Marry Guan to discuss with family treatment options. PT consult: LTC with 24 hr assistance with HHPT.  #Chronic history of dementia,  continue her home medication Aricept  #Hypokalemia replete potassium and improved.Marland Kitchen  #Acute cystitis with budding yeast-Diflucan  #Essential hypertension continue Norvasc home medication and titrate as needed   All the records are reviewed and case discussed with Care Management/Social Worker. Management plans discussed with the patient's huband and they are in agreement.  CODE STATUS: Full code  TOTAL TIME TAKING CARE OF THIS PATIENT: 38 minutes.   More than 50% of the time was spent in counseling/coordination of care: YES  POSSIBLE D/C IN 2 DAYS, DEPENDING ON CLINICAL CONDITION.   Demetrios Loll M.D on 09/13/2016 at 3:23 PM  Between 7am to 6pm - Pager - [phone]  After 6pm go to [email or web address] - Proofreader  [facility] [place] [facility]  Office  [phone]  CC: Primary care physician; Loura Pardon, MD  Note: This dictation was prepared with Dragon dictation along with smaller phrase technology. Any transcriptional errors that result from this process are unintentional.

## 2016-09-13 NOTE — Progress Notes (Signed)
Subjective :Patient is day 2 of admission for right proximal humerus fracture Patient patient unable to verbalize pain level. no nausea and no vomiting Was unable to arouse patient enough to be able to communicate. Patient sleeping very stellate but I was able to wake her but not to the point that communicating.   Objective: Vital signs in last 24 hours: Temp:  [97.6 F (36.4 C)-97.8 F (36.6 C)] 97.8 F (36.6 C) (11/07 0403) Pulse Rate:  [57-84] 72 (11/07 0403) Resp:  [15-20] 17 (11/07 0403) BP: (138-188)/(63-129) 188/65 (11/07 0403) SpO2:  [97 %-100 %] 100 % (11/07 0403) Weight:  [43.4 kg (95 lb 11.2 oz)-62.6 kg (137 lb 14.4 oz)] 43.4 kg (95 lb 11.2 oz) (11/06 2223) Ice pack has been placed on the right shoulder but no sling as of yet. Continues to have significant ecchymotic tissue to the proximal right humerus. No gross deformity noted. No tissue breakdown noted to the right upper extremity. Appears to have good range of motion elbow. Moving fingers well. Capillary refill. Be intact and within normal limits.  Intake/Output from previous day: 11/06 0701 - 11/07 0700 In: 636.7 [I.V.:636.7] Out: -  Intake/Output this shift: No intake/output data recorded.   Recent Labs  09/12/16 1419 09/13/16 0452  HGB 13.9 13.4    Recent Labs  09/12/16 1419 09/13/16 0452  WBC 15.8* 14.5*  RBC 4.58 4.35  HCT 42.4 40.9  PLT 295 259    Recent Labs  09/12/16 1419 09/13/16 0452  NA 143 144  K 3.1* 4.2  CL 106 112*  CO2 28 25  BUN 32* 21*  CREATININE 1.36* 0.84  GLUCOSE 217* 112*  CALCIUM 11.2* 10.4*    Recent Labs  09/13/16 0452  INR 0.98    Neurovascular intact Intact pulses distally  Assessment/Plan: Sling needs to be placed on right arm. Okay to work flexion and extension of right elbow. Continue ice pack right shoulder Dr. Marry Guan to discuss with family treatment options. Case management to assist with discharge planning Physical therapy today Bowel movement  today Labs in am Plan to discharge pending surgery. However if no surgery is performed, patient is ready for discharge to rehabilitation at any time.   WOLFE,JON R. 09/13/2016, 7:32 AM

## 2016-09-14 ENCOUNTER — Encounter
Admission: RE | Admit: 2016-09-14 | Discharge: 2016-09-14 | Disposition: A | Discharge status: Home / Self Care | Payer: Medicare Other | Source: Ambulatory Visit | Attending: Internal Medicine | Admitting: Internal Medicine

## 2016-09-14 LAB — CBC
HEMATOCRIT: 41.6 % (ref 35.0–47.0)
HEMOGLOBIN: 14 g/dL (ref 12.0–16.0)
MCH: 31.2 pg (ref 26.0–34.0)
MCHC: 33.6 g/dL (ref 32.0–36.0)
MCV: 92.7 fL (ref 80.0–100.0)
Platelets: 287 10*3/uL (ref 150–440)
RBC: 4.49 MIL/uL (ref 3.80–5.20)
RDW: 13.7 % (ref 11.5–14.5)
WBC: 14.3 10*3/uL — ABNORMAL HIGH (ref 3.6–11.0)

## 2016-09-14 MED ORDER — HYDRALAZINE HCL 20 MG/ML IJ SOLN: 10.0000 mg | Freq: Four times a day (QID) | INTRAMUSCULAR | Status: DC | PRN

## 2016-09-14 MED ORDER — SODIUM CHLORIDE 0.9 % IV SOLN
INTRAVENOUS | Status: AC
  Administered 2016-09-14: 08:00:00 via INTRAVENOUS

## 2016-09-14 NOTE — Progress Notes (Signed)
Clinical Education officer, museum (CSW) met with patient's husband at bedside to discuss d/c plan. Husband reported that he did want to proceed with the surgery. Husband prefers [facility]. [facility] admissions coordinator is reviewing referral. RN aware of above. CSW will continue to follow and assist as needed.   [employer], LCSW [phone]

## 2016-09-14 NOTE — Progress Notes (Signed)
Physical Therapy Treatment Patient Details Name: Deborah Espinoza MRN: [id number] DOB: 09-23-1933 Today's Date: 09/14/2016    History of Present Illness Pt admitted for proximal humerus fracture on R UE. Pt also with distal phalax of R thumb nondisplaced fracture. Pt sustained fractures from fall at home. Pt with history of dementia, HTN, and hyperlipidemia. Sustained 2 falls last week. Sling applied prior to arrival.    PT Comments    Pt is making poor progress towards goals and is limited secondary to dementia. Pt unable to follow commands for skilled care at this time. Needs total assist for all movement/mobility. Attempted OOB mobility with +2 assist, however pt with no carryover from previous session. Heavy post leaning noted and stiffness noted. Unable to ambulate at this time. Pt reports she is at home. Pain noted with all movement, however unable to rate on 0-10 scale. Not safe to go home. Will need 24 hr assist for all care. Not appropriate for intense SNF rehab at this time.  Follow Up Recommendations   (LTC with 24 hr assistance with HHPT)     Equipment Recommendations       Recommendations for Other Services       Precautions / Restrictions Precautions Precautions: Fall Restrictions Weight Bearing Restrictions: Yes RLE Weight Bearing: Non weight bearing    Mobility  Bed Mobility Overal bed mobility: +2 for physical assistance;Needs Assistance Bed Mobility: Supine to Sit     Supine to sit: Total assist;+2 for physical assistance     General bed mobility comments: pt unable to follow commands for sequencing or participation in therapy. +2 total assist required. Once seated at EOB, pt able to sit with decreased assist and progressing to supervision.   Transfers Overall transfer level: Needs assistance Equipment used: 1 person hand held assist Transfers: Sit to/from Stand Sit to Stand: Total assist         General transfer comment: assist for standing with L  UE pushing into therapist hands. 2nd person for balance. Pt with severe post leaning, unable to tolerate upright standing without heavy assist.  Ambulation/Gait Ambulation/Gait assistance: Max assist;+2 physical assistance Ambulation Distance (Feet): 1 Feet Assistive device: 1 person hand held assist Gait Pattern/deviations: Step-to pattern     General Gait Details: Pt able to take 1 step sideways up towards HOB. Pt very rigid, unable to follow commands for further mobility. Assisted pt to sit on bed and returned supine as pt had incontinent episode.   Stairs            Wheelchair Mobility    Modified Rankin (Stroke Patients Only)       Balance                                    Cognition Arousal/Alertness: Awake/alert Behavior During Therapy: Restless Overall Cognitive Status: Difficult to assess                      Exercises Other Exercises Other Exercises: attempted B LE ther-ex, however pt unable to participate in lifiting B LE off bed, grip squeezes or movement with R UE. Pt confused and unable to follow commands    General Comments        Pertinent Vitals/Pain Pain Assessment: Faces Faces Pain Scale: Hurts worst Pain Location: R shoulder; Pt very guarded with all movement.  Unable to state detail pain rating Pain Descriptors / Indicators: Aching;Dull;Discomfort  Pain Intervention(s): Limited activity within patient's tolerance;Repositioned;Ice applied    Home Living                      Prior Function            PT Goals (current goals can now be found in the care plan section) Acute Rehab PT Goals Patient Stated Goal: unable to state PT Goal Formulation: Patient unable to participate in goal setting Time For Goal Achievement: 09/27/16 Potential to Achieve Goals: Fair Progress towards PT goals: Progressing toward goals    Frequency    7X/week      PT Plan Current plan remains appropriate    Co-evaluation              End of Session Equipment Utilized During Treatment: Gait belt Activity Tolerance: Patient limited by pain Patient left: in bed;with bed alarm set     Time: 1050-1102 PT Time Calculation (min) (ACUTE ONLY): 12 min  Charges:  $Therapeutic Activity: 8-22 mins                    G Codes:      Deborah Espinoza October 03, 2016, 11:30 AM  [name], PT, DPT [phone]

## 2016-09-14 NOTE — Progress Notes (Signed)
Clinical Education officer, museum (CSW) received a Advertising account executive from [employer] from [employer] requesting an OT consult to address patient's ADL's such as grooming, dressing, and bathing. CSW requested OT consult from MD. It is unclear at this time if patient will have surgery. CSW will continue to follow and assist as needed.   [employer], LCSW [phone]

## 2016-09-14 NOTE — Progress Notes (Signed)
[place] at [place] NAME: Deborah Espinoza    MR#:  [id number]  DATE OF BIRTH:  [birth year]  SUBJECTIVE:  CHIEF COMPLAINT:   Chief Complaint  Patient presents with  . Fall   The patient is demented, noncommunicative. Good oral intake per RN. REVIEW OF SYSTEMS:  Review of Systems  Unable to perform ROS: Dementia    DRUG ALLERGIES:   Allergies  Allergen Reactions  . Cephalexin   . Clarithromycin   . Codeine   . Hydrocodone   . Penicillins   . Risedronate Sodium    VITALS:  Blood pressure (!) 168/52, pulse 64, temperature 97.5 F (36.4 C), temperature source Oral, resp. rate 19, height 5' (1.524 m), weight 95 lb 11.2 oz (43.4 kg), SpO2 98 %. PHYSICAL EXAMINATION:  Physical Exam  Constitutional: No distress.  Malnutrition.  Eyes: Conjunctivae and EOM are normal.  Neck: Normal range of motion. Neck supple. No JVD present. No tracheal deviation present.  Cardiovascular: Normal rate, regular rhythm and normal heart sounds.  Exam reveals no gallop.   No murmur heard. Pulmonary/Chest: Effort normal and breath sounds normal. No respiratory distress. She has no wheezes.  Abdominal: Soft. Bowel sounds are normal. She exhibits no distension. There is no tenderness.  Musculoskeletal: She exhibits no edema or tenderness.  Neurological:  The patient is demented, unable to exam.  Skin: No rash noted. No erythema.  Psychiatric:  Demented, noncommunicative.   LABORATORY PANEL:   CBC  Recent Labs Lab 09/14/16 0636  WBC 14.3*  HGB 14.0  HCT 41.6  PLT 287   ------------------------------------------------------------------------------------------------------------------ Chemistries   Recent Labs Lab 09/13/16 0452  NA 144  K 4.2  CL 112*  CO2 25  GLUCOSE 112*  BUN 21*  CREATININE 0.84  CALCIUM 10.4*  MG 2.2  AST 41  ALT 24  ALKPHOS 79  BILITOT 1.5*   RADIOLOGY:  No results found. ASSESSMENT AND PLAN:   Kiyanne Om  is a 82 y.o. female with a known history of Dementia, hypertension and hyperlipidemia is brought into the emergency department by her husband after she sustained a fall. According to the husband patient was having more wandering behavior lately and had 2 falls in the past one week. Patient had 1 fall on last Wednesday and underwent on Friday. Patient was complaining of right hand pain and has multiple bruises and ecchymosis and unable to ambulate  # Acute right surgical neck fracture of the humerus from the recent fall Pain management as needed Start diet, aspiration precaution. Dr. Marry Guan to discuss with family treatment options. PT consult: LTC with 24 hr assistance with HHPT.  #Chronic history of dementia,  continue her home medication Aricept  #Hypokalemia replete potassium and improved.Marland Kitchen  #Acute cystitis with budding yeast-Diflucan given.  #Essential hypertension continue Norvasc home medication and titrate as needed  I discussed with  Dr. Marry Guan. All the records are reviewed and case discussed with Care Management/Social Worker. Management plans discussed with the patient's huband and they are in agreement.  CODE STATUS: Full code  TOTAL TIME TAKING CARE OF THIS PATIENT: 33 minutes.   More than 50% of the time was spent in counseling/coordination of care: YES  POSSIBLE D/C IN 2 DAYS, DEPENDING ON CLINICAL CONDITION.   Demetrios Loll M.D on 09/14/2016 at 1:53 PM  Between 7am to 6pm - Pager - [phone]  After 6pm go to [email or web address] - [facility]  Office  [phone]  CC: Primary care physician; Loura Pardon, MD  Note: This dictation was prepared with Dragon dictation along with smaller phrase technology. Any transcriptional errors that result from this process are unintentional.

## 2016-09-14 NOTE — Progress Notes (Signed)
Subjective :Patient is day 3 right prox humerus fx Patient unable to communicate    Objective: Vital signs in last 24 hours: Temp:  [97.4 F (36.3 C)-97.8 F (36.6 C)] 97.5 F (36.4 C) (11/08 0737) Pulse Rate:  [64-82] 64 (11/08 0737) Resp:  [16-19] 19 (11/08 0737) BP: (95-178)/(52-84) 168/52 (11/08 0747) SpO2:  [98 %-100 %] 98 % (11/08 0737) Still a lot of brusing to the right arm. Tender to palpation Good range of motion to elbow and fingers  Intake/Output from previous day: 11/07 0701 - 11/08 0700 In: 1042.9 [I.V.:1042.9] Out: -  Intake/Output this shift: No intake/output data recorded.   Recent Labs  09/12/16 1419 09/13/16 0452 09/14/16 0636  HGB 13.9 13.4 14.0    Recent Labs  09/13/16 0452 09/14/16 0636  WBC 14.5* 14.3*  RBC 4.35 4.49  HCT 40.9 41.6  PLT 259 287    Recent Labs  09/12/16 1419 09/13/16 0452  NA 143 144  K 3.1* 4.2  CL 106 112*  CO2 28 25  BUN 32* 21*  CREATININE 1.36* 0.84  GLUCOSE 217* 112*  CALCIUM 11.2* 10.4*    Recent Labs  09/13/16 0452  INR 0.98    Neurovascular intact Intact pulses distally Compartment soft  Assessment/Plan: Waiting response from family as to surgery or conservative care. Suppose to let up know today Case management to assist with discharge planning Physical therapy today Bowel movement today Labs in am Plan to discharge once decision has been made on surgery or not   Deborah Espinoza R. 09/14/2016, 10:45 AM

## 2016-09-14 NOTE — Progress Notes (Signed)
OT Cancellation Note  Patient Details Name: TINY BARTOS MRN: [id number] DOB: [birth year]   Cancelled Treatment:    Reason Eval/Treat Not Completed: Patient not medically ready.  Order received and chart reviewed.  Spoke to PT and Mel Almond from [place] [facility] who indicated that patient's husband has agreed to surgical intervention for R humeral fracture and rec OT evaluation be completed after surgery to assess ADLs.  Will monitor to see when patient has surgery and provide OT assessment thereafter.  Thank you for the referral.  Chrys Racer, OTR/L ascom [phone] 09/14/16, 3:56 PM

## 2016-09-15 LAB — CBC
HCT: 39.1 % (ref 35.0–47.0)
HEMOGLOBIN: 13.3 g/dL (ref 12.0–16.0)
MCH: 31.3 pg (ref 26.0–34.0)
MCHC: 34 g/dL (ref 32.0–36.0)
MCV: 92.1 fL (ref 80.0–100.0)
Platelets: 266 10*3/uL (ref 150–440)
RBC: 4.24 MIL/uL (ref 3.80–5.20)
RDW: 13.9 % (ref 11.5–14.5)
WBC: 12.9 10*3/uL — ABNORMAL HIGH (ref 3.6–11.0)

## 2016-09-15 LAB — URINE CULTURE: CULTURE: NO GROWTH

## 2016-09-15 MED ORDER — TRAMADOL HCL 50 MG PO TABS: 50.0000 mg | ORAL_TABLET | Freq: Four times a day (QID) | ORAL | 0 refills | Status: DC | PRN

## 2016-09-15 MED ORDER — BISACODYL 5 MG PO TBEC: 5.0000 mg | DELAYED_RELEASE_TABLET | Freq: Every day | ORAL | 0 refills | Status: AC | PRN

## 2016-09-15 MED ORDER — ACETAMINOPHEN 325 MG PO TABS: 650.0000 mg | ORAL_TABLET | Freq: Four times a day (QID) | ORAL | Status: AC | PRN

## 2016-09-15 NOTE — Care Management (Signed)
Patient observation patient. She will need to be discharged home or to SNF and return on Monday for surgery. CSW aware and to speak with spouse today regarding private pay at [facility].

## 2016-09-15 NOTE — Progress Notes (Addendum)
[place] at [place] NAME: Deborah Espinoza    MR#:  [id number]  DATE OF BIRTH:  [birth year]  SUBJECTIVE:  CHIEF COMPLAINT:   Chief Complaint  Patient presents with  . Fall   The patient is demented, noncommunicative. REVIEW OF SYSTEMS:  Review of Systems  Unable to perform ROS: Dementia    DRUG ALLERGIES:   Allergies  Allergen Reactions  . Cephalexin   . Clarithromycin   . Codeine   . Hydrocodone   . Penicillins   . Risedronate Sodium    VITALS:  Blood pressure (!) 90/58, pulse 95, temperature 97.7 F (36.5 C), temperature source Oral, resp. rate 17, height 5' (1.524 m), weight 95 lb 11.2 oz (43.4 kg), SpO2 98 %. PHYSICAL EXAMINATION:  Physical Exam  Constitutional: No distress.  Malnutrition.  Eyes: Conjunctivae and EOM are normal.  Neck: Normal range of motion. Neck supple. No JVD present. No tracheal deviation present.  Cardiovascular: Normal rate, regular rhythm and normal heart sounds.  Exam reveals no gallop.   No murmur heard. Pulmonary/Chest: Effort normal and breath sounds normal. No respiratory distress. She has no wheezes.  Abdominal: Soft. Bowel sounds are normal. She exhibits no distension. There is no tenderness.  Musculoskeletal: She exhibits no edema or tenderness.  Neurological:  The patient is demented, unable to exam.  Skin: No rash noted. No erythema.  Psychiatric:  Demented, noncommunicative.   LABORATORY PANEL:   CBC  Recent Labs Lab 09/15/16 0331  WBC 12.9*  HGB 13.3  HCT 39.1  PLT 266   ------------------------------------------------------------------------------------------------------------------ Chemistries   Recent Labs Lab 09/13/16 0452  NA 144  K 4.2  CL 112*  CO2 25  GLUCOSE 112*  BUN 21*  CREATININE 0.84  CALCIUM 10.4*  MG 2.2  AST 41  ALT 24  ALKPHOS 79  BILITOT 1.5*   RADIOLOGY:  No results found. ASSESSMENT AND PLAN:   Deborah Espinoza  is a 82 y.o. female  with a known history of Dementia, hypertension and hyperlipidemia is brought into the emergency department by her husband after she sustained a fall. According to the husband patient was having more wandering behavior lately and had 2 falls in the past one week. Patient had 1 fall on last Wednesday and underwent on Friday. Patient was complaining of right hand pain and has multiple bruises and ecchymosis and unable to ambulate  # Acute right surgical neck fracture of the humerus from the recent fall Pain management as needed Started diet, aspiration precaution. Dr. Marry Guan discussed with the patient's husband. He would like to proceed with plans for surgical intervention. Per Dr. Marry Guan, scheduling the patient for a right reverse shoulder arthroplasty on Monday afternoon. PT consult: LTC with 24 hr assistance with HHPT.  #Chronic history of dementia,  continue her home medication Aricept  #Hypokalemia replete potassium and improved.Marland Kitchen  #Acute cystitis with budding yeast-Diflucan given.  #Essential hypertension continue Norvasc home medication and titrate as needed  # ARF due to dehydration, improved.  All the records are reviewed and case discussed with Care Management/Social Worker. Management plans discussed with the patient's huband and they are in agreement.  CODE STATUS: Full code  TOTAL TIME TAKING CARE OF THIS PATIENT: 28 minutes.   More than 50% of the time was spent in counseling/coordination of care: YES  POSSIBLE D/C IN 5 DAYS, DEPENDING ON CLINICAL CONDITION.   Demetrios Loll M.D on 09/15/2016 at 12:37 PM  Between 7am to 6pm - Pager -  [phone]  After 6pm go to [email or web address] - Proofreader  [facility]  Office  [phone]  CC: Primary care physician; Loura Pardon, MD  Note: This dictation was prepared with Dragon dictation along with smaller phrase technology. Any transcriptional errors that result from this process are  unintentional.

## 2016-09-15 NOTE — Discharge Instructions (Signed)
Follow-up Dr. Marry Guan for right shoulder surgery this coming Monday. Fall and aspiration precaution.

## 2016-09-15 NOTE — Clinical Social Work Placement (Signed)
CLINICAL SOCIAL WORK PLACEMENT  NOTE  Date:  09/15/2016  Patient Details  Name: Deborah Espinoza MRN: [id number] Date of Birth: 1932-11-27  Clinical Social Work is seeking post-discharge placement for this patient at the Skilled  Nursing Facility level of care (*CSW will initial, date and re-position this form in  chart as items are completed):  Yes   Patient/family provided with [facility] Clinical Social Work Department's list of facilities offering this level of care within the geographic area requested by the patient (or if unable, by the patient's family).  Yes   Patient/family informed of their freedom to choose among providers that offer the needed level of care, that participate in Medicare, Medicaid or managed care program needed by the patient, have an available bed and are willing to accept the patient.  Yes   Patient/family informed of [facility]'s ownership interest in [facility] and [facility], as well as of the fact that they are under no obligation to receive care at these facilities.  PASRR submitted to EDS on 09/13/16     PASRR number received on 09/13/16     Existing PASRR number confirmed on       FL2 transmitted to all facilities in geographic area requested by pt/family on 09/13/16     FL2 transmitted to all facilities within larger geographic area on       Patient informed that his/her managed care company has contracts with or will negotiate with certain facilities, including the following:        Yes   Patient/family informed of bed offers received.  Patient chooses bed at  [facility] )     Physician recommends and patient chooses bed at      Patient to be transferred to  [facility] ) on 09/15/16.  Patient to be transferred to facility by  [facility] )     Patient family notified on 09/15/16 of transfer.  Name of family member notified:   (Patient's husband [name] is aware of D/C today. )     PHYSICIAN        Additional Comment:    _______________________________________________ Jovi Zavadil, Darleen Crocker, LCSW 09/15/2016, 5:00 PM

## 2016-09-15 NOTE — Progress Notes (Signed)
Discharged to [facility] by EMS

## 2016-09-15 NOTE — Discharge Summary (Signed)
[place] at [place] NAME: Deborah Espinoza    MR#:  [id number]  DATE OF BIRTH:  February 10, 1933  DATE OF ADMISSION:  09/12/2016   ADMITTING PHYSICIAN: Nicholes Mango, MD  DATE OF DISCHARGE: 09/15/2016  PRIMARY CARE PHYSICIAN: Loura Pardon, MD   ADMISSION DIAGNOSIS:  Swelling [R60.9] Ecchymosis [R58] Bruising [T14.8XXA] Pain [R52] Right shoulder pain [M25.511] Contusion of right knee, initial encounter [S80.01XA] Contusion of left knee, initial encounter [S80.02XA] Fracture of humerus neck, right, closed, initial encounter [S42.211A] DISCHARGE DIAGNOSIS:  Active Problems:   Shoulder fracture, right, closed, initial encounter   Shoulder fracture   Pressure injury of skin Acute right surgical neck fracture of the humerus SECONDARY DIAGNOSIS:   Past Medical History:  Diagnosis Date  . Allergic rhinitis, cause unspecified   . Breast cancer ([place])    right  . Cataracts, bilateral    complications from eye surgery  . Dizziness and giddiness   . Edema   . Elevated blood pressure reading without diagnosis of hypertension   . External hemorrhoids without mention of complication   . GERD (gastroesophageal reflux disease)   . Lichen, unspecified    sclerosis  . Memory loss   . Mixed hyperlipidemia   . Osteoporosis, unspecified   . Other constipation   . Personal history of colonic polyps   . Sprain of neck   . Unspecified constipation   . Unspecified essential hypertension   . Unspecified hearing loss   . Urticaria, unspecified   . Varicose veins of lower extremities with other complications    HOSPITAL COURSE:   Deborah Espinoza a 82 y.o. femalewith a known history of Dementia, hypertension and hyperlipidemia is brought into the emergency department by her husband after she sustained a fall. According to the husband patient was having more wandering behavior lately and had 2 falls in the past one week. Patient had 1 fall on last  Wednesday and underwent on Friday. Patient was complaining of right hand pain and has multiple bruises and ecchymosis and unable to ambulate  # Acute right surgical neck fracture of the humerus from the recent fall Pain management as needed Started diet, aspiration precaution. Dr. Marry Guan discussed with the patient's husband. He would like to proceed with plans for surgical intervention. Per Dr. Marry Guan, scheduling the patient for a right reverse shoulder arthroplasty on Monday afternoon. PT consult: LTC with 24 hr assistance with HHPT.  #Chronic history of dementia,  continue her home medication Aricept  #Hypokalemia replete potassium and improved.Marland Kitchen  #Acute cystitis with budding yeast-Diflucan given.  #Essential hypertension continue Norvasc home medication and titrate as needed  # ARF due to dehydration, improved.  DISCHARGE CONDITIONS:  Stable, discharge to nursing skilled facility today. CONSULTS OBTAINED:  Treatment Team:  Dereck Leep, MD Demetrios Loll, MD DRUG ALLERGIES:   Allergies  Allergen Reactions  . Cephalexin   . Clarithromycin   . Codeine   . Hydrocodone   . Penicillins   . Risedronate Sodium    DISCHARGE MEDICATIONS:     Medication List    TAKE these medications   acetaminophen 325 MG tablet Commonly known as:  TYLENOL Take 2 tablets (650 mg total) by mouth every 6 (six) hours as needed for mild pain (or Fever >/= 101).   amLODipine 5 MG tablet Commonly known as:  NORVASC Take 1 tablet (5 mg total) by mouth daily.   bisacodyl 5 MG EC tablet Commonly known as:  DULCOLAX Take 1 tablet (  5 mg total) by mouth daily as needed for moderate constipation.   donepezil 10 MG tablet Commonly known as:  ARICEPT Take 2 tablets (20 mg total) by mouth at bedtime as needed.   PRESERVISION AREDS PO Take 1 tablet by mouth daily.   traMADol 50 MG tablet Commonly known as:  ULTRAM Take 1 tablet (50 mg total) by mouth every 6 (six) hours as needed for moderate  pain.   VITAMIN D PO Take 1 tablet by mouth daily.        DISCHARGE INSTRUCTIONS:  See AVS.  If you experience worsening of your admission symptoms, develop shortness of breath, life threatening emergency, suicidal or homicidal thoughts you must seek medical attention immediately by calling 911 or calling your MD immediately  if symptoms less severe.  You Must read complete instructions/literature along with all the possible adverse reactions/side effects for all the Medicines you take and that have been prescribed to you. Take any new Medicines after you have completely understood and accpet all the possible adverse reactions/side effects.   Please note  You were cared for by a hospitalist during your hospital stay. If you have any questions about your discharge medications or the care you received while you were in the hospital after you are discharged, you can call the unit and asked to speak with the hospitalist on call if the hospitalist that took care of you is not available. Once you are discharged, your primary care physician will handle any further medical issues. Please note that NO REFILLS for any discharge medications will be authorized once you are discharged, as it is imperative that you return to your primary care physician (or establish a relationship with a primary care physician if you do not have one) for your aftercare needs so that they can reassess your need for medications and monitor your lab values.    On the day of Discharge:  VITAL SIGNS:  Blood pressure (!) 171/76, pulse 70, temperature 97.7 F (36.5 C), temperature source Oral, resp. rate 18, height 5' (1.524 m), weight 95 lb 11.2 oz (43.4 kg), SpO2 98 %. PHYSICAL EXAMINATION:  GENERAL:  82 y.o.-year-old patient lying in the bed with no acute distress.  EYES: Pupils equal, round, reactive to light and accommodation. No scleral icterus. Extraocular muscles intact.  HEENT: Head atraumatic, normocephalic.    NECK:  Supple, no jugular venous distention. No thyroid enlargement, no tenderness.  LUNGS: Normal breath sounds bilaterally, no wheezing, rales,rhonchi or crepitation. No use of accessory muscles of respiration.  CARDIOVASCULAR: S1, S2 normal. No murmurs, rubs, or gallops.  ABDOMEN: Soft, non-tender, non-distended. Bowel sounds present. No organomegaly or mass.  EXTREMITIES: No pedal edema, cyanosis, or clubbing. Right shoulder with bruises in sling. NEUROLOGIC: Unable to exam. PSYCHIATRIC: The patient is Demented. SKIN: No obvious rash, lesion, or ulcer.  DATA REVIEW:   CBC  Recent Labs Lab 09/15/16 0331  WBC 12.9*  HGB 13.3  HCT 39.1  PLT 266    Chemistries   Recent Labs Lab 09/13/16 0452  NA 144  K 4.2  CL 112*  CO2 25  GLUCOSE 112*  BUN 21*  CREATININE 0.84  CALCIUM 10.4*  MG 2.2  AST 41  ALT 24  ALKPHOS 51  BILITOT 1.5*     Microbiology Results  Results for orders placed or performed during the hospital encounter of 09/12/16  Surgical pcr screen     Status: None   Collection Time: 09/12/16  9:03 PM  Result Value Ref  Range Status   MRSA, PCR NEGATIVE NEGATIVE Final   Staphylococcus aureus NEGATIVE NEGATIVE Final    Comment:        The Xpert SA Assay (FDA approved for NASAL specimens in patients over 14 years of age), is one component of a comprehensive surveillance program.  Test performance has been validated by [facility] for patients greater than or equal to 4 year old. It is not intended to diagnose infection nor to guide or monitor treatment.   Urine culture     Status: None   Collection Time: 09/14/16  8:40 AM  Result Value Ref Range Status   Specimen Description URINE, CLEAN CATCH  Final   Special Requests NONE  Final   Culture NO GROWTH Performed at [facility]   Final   Report Status 09/15/2016 FINAL  Final    RADIOLOGY:  No results found.   Management plans discussed with the patient, family and they are in  agreement.  CODE STATUS:     Code Status Orders        Start     Ordered   09/12/16 2042  Full code  Continuous     09/12/16 2041    Code Status History    Date Active Date Inactive Code Status Order ID Comments User Context   This patient has a current code status but no historical code status.      TOTAL TIME TAKING CARE OF THIS PATIENT: 32 minutes.    Demetrios Loll M.D on 09/15/2016 at 2:59 PM  Between 7am to 6pm - Pager - [phone]  After 6pm go to [email or web address] - Proofreader  [facility]  Office  [phone]  CC: Primary care physician; Loura Pardon, MD   Note: This dictation was prepared with Dragon dictation along with smaller phrase technology. Any transcriptional errors that result from this process are unintentional.

## 2016-09-15 NOTE — Consult Note (Signed)
ORTHOPAEDICS: The patient is arousable but confused. She responds to simple questions. The head of the bed is elevated and a sling is in place. Still with significant amount of swelling and ecchymosis to the right shoulder and upper arm.  I had a lengthy conversation with the patient's husband concerning treatment options. He would like to proceed with plans for surgical intervention. I would like to see some additional improvement with the degree of swelling so as to minimize potential problems with wound healing. I notified Dr. Roland Rack who anticipates scheduling the patient for a right reverse shoulder arthroplasty on Monday afternoon.  James P. Holley Bouche M.D.

## 2016-09-15 NOTE — Progress Notes (Signed)
OT Cancellation Note  Patient Details Name: Deborah Espinoza MRN: [id number] DOB: 12-Jan-1933   Cancelled Treatment:    Reason Eval/Treat Not Completed: Patient not medically ready (Will hold until after anticipated surgery.)  Harrel Carina, MS, OTR/L 09/15/2016, 9:30 AM

## 2016-09-15 NOTE — Progress Notes (Signed)
Patient's husband Mr. Deborah Espinoza has agreed to pay privately for respite care at [facility] and will D/C there today. Per [facility] admissions coordinator at [facility] patient will go to room 606. RN will call report to [facility] and arrange EMS for transport. Patient's husband completed paper work at [employer] today. Clinical Education officer, museum (CSW) sent D/C orders to [employer] via [employer]. Patient will come back to [facility] on Monday for surgery with Dr. Roland Rack. Please reconsult if future social work needs arise. CSW signing off.   [employer], LCSW [phone]

## 2016-09-15 NOTE — Progress Notes (Signed)
Clinical Education officer, museum (CSW) attempted to meet with patient's husband Phebe Colla to discuss D/C plan however he was not at bedside. CSW contacted husband's home phone and left a voicemail. Per [facility] admissions coordinator at [facility] patient can come to facility under private pay as long as patient is not an elopement risk. Magda Paganini reported that she would meet with patient's husband at bedside today. CSW will continue to follow and assist as needed.   [employer], LCSW [phone]

## 2016-09-15 NOTE — Progress Notes (Signed)
Pt being discharged to [facility] place. PIVs removed. Discharged reviewed with husband. Packet sent with pt including prescriptions. Report called to Nelida Gores, all questions answered. She is leaving with her belongings, will be transported via EMS.

## 2016-09-15 NOTE — Progress Notes (Signed)
PT Cancellation Note  Patient Details Name: Deborah Espinoza MRN: [id number] DOB: Sep 20, 1933   Cancelled Treatment:    Reason Eval/Treat Not Completed: Other (comment). Per chart review, family has decided on surgical intervention for proximal humerus fx. Pt not able to participate in therapy past few days secondary to pain and dementia; may be more appropriate for therapy s/p Sx. Will discontinue current orders this date. Please re-order post Sx if pt appropriate.   Add Dinapoli 09/15/2016, 11:00 AM  Greggory Stallion, PT, DPT [phone]

## 2016-09-19 ENCOUNTER — Inpatient Hospital Stay: Payer: Medicare Other

## 2016-09-19 ENCOUNTER — Encounter: Payer: Self-pay | Admitting: *Deleted

## 2016-09-19 ENCOUNTER — Inpatient Hospital Stay: Payer: Medicare Other | Admitting: Certified Registered Nurse Anesthetist

## 2016-09-19 ENCOUNTER — Encounter
Admission: RE | Disposition: A | Discharge status: Skilled Nursing Facility | Payer: Self-pay | Source: Ambulatory Visit | Attending: Surgery

## 2016-09-19 ENCOUNTER — Inpatient Hospital Stay
Admission: RE | Admit: 2016-09-19 | Discharge: 2016-09-22 | DRG: 483 | Disposition: A | Discharge status: Skilled Nursing Facility | Payer: Medicare Other | Source: Ambulatory Visit | Attending: Surgery | Admitting: Surgery

## 2016-09-19 DIAGNOSIS — Z9849 Cataract extraction status, unspecified eye: Secondary | ICD-10-CM | POA: Diagnosis not present

## 2016-09-19 DIAGNOSIS — Z8051 Family history of malignant neoplasm of kidney: Secondary | ICD-10-CM | POA: Diagnosis not present

## 2016-09-19 DIAGNOSIS — N179 Acute kidney failure, unspecified: Secondary | ICD-10-CM | POA: Diagnosis not present

## 2016-09-19 DIAGNOSIS — Z853 Personal history of malignant neoplasm of breast: Secondary | ICD-10-CM

## 2016-09-19 DIAGNOSIS — Z885 Allergy status to narcotic agent status: Secondary | ICD-10-CM | POA: Diagnosis not present

## 2016-09-19 DIAGNOSIS — M810 Age-related osteoporosis without current pathological fracture: Secondary | ICD-10-CM | POA: Diagnosis present

## 2016-09-19 DIAGNOSIS — K219 Gastro-esophageal reflux disease without esophagitis: Secondary | ICD-10-CM | POA: Diagnosis present

## 2016-09-19 DIAGNOSIS — Z90710 Acquired absence of both cervix and uterus: Secondary | ICD-10-CM

## 2016-09-19 DIAGNOSIS — L89151 Pressure ulcer of sacral region, stage 1: Secondary | ICD-10-CM | POA: Diagnosis not present

## 2016-09-19 DIAGNOSIS — F0390 Unspecified dementia without behavioral disturbance: Secondary | ICD-10-CM | POA: Diagnosis present

## 2016-09-19 DIAGNOSIS — I739 Peripheral vascular disease, unspecified: Secondary | ICD-10-CM | POA: Diagnosis present

## 2016-09-19 DIAGNOSIS — E43 Unspecified severe protein-calorie malnutrition: Secondary | ICD-10-CM | POA: Diagnosis present

## 2016-09-19 DIAGNOSIS — R059 Cough, unspecified: Secondary | ICD-10-CM

## 2016-09-19 DIAGNOSIS — Z800 Family history of malignant neoplasm of digestive organs: Secondary | ICD-10-CM | POA: Diagnosis not present

## 2016-09-19 DIAGNOSIS — Z881 Allergy status to other antibiotic agents status: Secondary | ICD-10-CM

## 2016-09-19 DIAGNOSIS — Z681 Body mass index (BMI) 19 or less, adult: Secondary | ICD-10-CM | POA: Diagnosis not present

## 2016-09-19 DIAGNOSIS — S42211A Unspecified displaced fracture of surgical neck of right humerus, initial encounter for closed fracture: Principal | ICD-10-CM | POA: Diagnosis present

## 2016-09-19 DIAGNOSIS — S42201A Unspecified fracture of upper end of right humerus, initial encounter for closed fracture: Secondary | ICD-10-CM | POA: Diagnosis present

## 2016-09-19 DIAGNOSIS — M6281 Muscle weakness (generalized): Secondary | ICD-10-CM

## 2016-09-19 DIAGNOSIS — J9811 Atelectasis: Secondary | ICD-10-CM | POA: Diagnosis not present

## 2016-09-19 DIAGNOSIS — Z79899 Other long term (current) drug therapy: Secondary | ICD-10-CM

## 2016-09-19 DIAGNOSIS — W06XXXA Fall from bed, initial encounter: Secondary | ICD-10-CM | POA: Diagnosis present

## 2016-09-19 DIAGNOSIS — Z880 Allergy status to penicillin: Secondary | ICD-10-CM

## 2016-09-19 DIAGNOSIS — H9190 Unspecified hearing loss, unspecified ear: Secondary | ICD-10-CM | POA: Diagnosis present

## 2016-09-19 DIAGNOSIS — Z808 Family history of malignant neoplasm of other organs or systems: Secondary | ICD-10-CM | POA: Diagnosis not present

## 2016-09-19 DIAGNOSIS — Z96611 Presence of right artificial shoulder joint: Secondary | ICD-10-CM

## 2016-09-19 DIAGNOSIS — E782 Mixed hyperlipidemia: Secondary | ICD-10-CM | POA: Diagnosis present

## 2016-09-19 DIAGNOSIS — Z888 Allergy status to other drugs, medicaments and biological substances status: Secondary | ICD-10-CM

## 2016-09-19 DIAGNOSIS — I10 Essential (primary) hypertension: Secondary | ICD-10-CM | POA: Diagnosis present

## 2016-09-19 DIAGNOSIS — M25511 Pain in right shoulder: Secondary | ICD-10-CM

## 2016-09-19 DIAGNOSIS — R000 Tachycardia, unspecified: Secondary | ICD-10-CM | POA: Diagnosis not present

## 2016-09-19 DIAGNOSIS — D72829 Elevated white blood cell count, unspecified: Secondary | ICD-10-CM | POA: Diagnosis not present

## 2016-09-19 DIAGNOSIS — R05 Cough: Secondary | ICD-10-CM

## 2016-09-19 HISTORY — PX: REVERSE SHOULDER ARTHROPLASTY: SHX5054

## 2016-09-19 LAB — URINALYSIS COMPLETE WITH MICROSCOPIC (ARMC ONLY)
Bilirubin Urine: NEGATIVE
Glucose, UA: NEGATIVE mg/dL
HGB URINE DIPSTICK: NEGATIVE
Ketones, ur: NEGATIVE mg/dL
Leukocytes, UA: NEGATIVE
NITRITE: POSITIVE — AB
PROTEIN: NEGATIVE mg/dL
SPECIFIC GRAVITY, URINE: 1.016 (ref 1.005–1.030)
SQUAMOUS EPITHELIAL / LPF: NONE SEEN
pH: 5 (ref 5.0–8.0)

## 2016-09-19 LAB — BASIC METABOLIC PANEL
Anion gap: 7 (ref 5–15)
BUN: 34 mg/dL — AB (ref 6–20)
CHLORIDE: 116 mmol/L — AB (ref 101–111)
CO2: 24 mmol/L (ref 22–32)
CREATININE: 1.21 mg/dL — AB (ref 0.44–1.00)
Calcium: 11 mg/dL — ABNORMAL HIGH (ref 8.9–10.3)
GFR calc non Af Amer: 41 mL/min — ABNORMAL LOW (ref >60)
GFR, EST AFRICAN AMERICAN: 47 mL/min — AB (ref >60)
GLUCOSE: 179 mg/dL — AB (ref 65–99)
Potassium: 6.1 mmol/L — ABNORMAL HIGH (ref 3.5–5.1)
Sodium: 147 mmol/L — ABNORMAL HIGH (ref 135–145)

## 2016-09-19 LAB — TYPE AND SCREEN
ABO/RH(D): A POS
ANTIBODY SCREEN: NEGATIVE

## 2016-09-19 LAB — PROTIME-INR
INR: 0.89
PROTHROMBIN TIME: 12 s (ref 11.4–15.2)

## 2016-09-19 LAB — SURGICAL PCR SCREEN
MRSA, PCR: NEGATIVE
Staphylococcus aureus: NEGATIVE

## 2016-09-19 LAB — CBC
HCT: 46 % (ref 35.0–47.0)
Hemoglobin: 15.3 g/dL (ref 12.0–16.0)
MCH: 31.1 pg (ref 26.0–34.0)
MCHC: 33.4 g/dL (ref 32.0–36.0)
MCV: 93.2 fL (ref 80.0–100.0)
PLATELETS: 319 10*3/uL (ref 150–440)
RBC: 4.94 MIL/uL (ref 3.80–5.20)
RDW: 14.3 % (ref 11.5–14.5)
WBC: 18.7 10*3/uL — ABNORMAL HIGH (ref 3.6–11.0)

## 2016-09-19 SURGERY — ARTHROPLASTY, SHOULDER, TOTAL, REVERSE
Anesthesia: General | Site: Shoulder | Laterality: Right | Wound class: Clean

## 2016-09-19 MED ORDER — SODIUM CHLORIDE 0.9 % IV SOLN
INTRAVENOUS | Status: DC
  Administered 2016-09-20: via INTRAVENOUS
  Administered 2016-09-20: 75 mL/h via INTRAVENOUS
  Administered 2016-09-21: 23:00:00 via INTRAVENOUS

## 2016-09-19 MED ORDER — AMLODIPINE BESYLATE 5 MG PO TABS
5.0000 mg | ORAL_TABLET | Freq: Every day | ORAL | Status: DC
  Administered 2016-09-20 – 2016-09-22 (×3): 5 mg via ORAL
  Filled 2016-09-19 (×3): qty 1

## 2016-09-19 MED ORDER — BUPIVACAINE-EPINEPHRINE (PF) 0.5% -1:200000 IJ SOLN
INTRAMUSCULAR | Status: AC
  Filled 2016-09-19: qty 30

## 2016-09-19 MED ORDER — MAGNESIUM HYDROXIDE 400 MG/5ML PO SUSP: 30.0000 mL | Freq: Every day | ORAL | Status: DC | PRN

## 2016-09-19 MED ORDER — ONDANSETRON HCL 4 MG/2ML IJ SOLN: 4.0000 mg | Freq: Four times a day (QID) | INTRAMUSCULAR | Status: DC | PRN

## 2016-09-19 MED ORDER — METOCLOPRAMIDE HCL 5 MG/ML IJ SOLN: 5.0000 mg | Freq: Three times a day (TID) | INTRAMUSCULAR | Status: DC | PRN

## 2016-09-19 MED ORDER — TRANEXAMIC ACID 1000 MG/10ML IV SOLN
INTRAVENOUS | Status: DC | PRN
  Administered 2016-09-19: 1000 mg via INTRAVENOUS

## 2016-09-19 MED ORDER — BISACODYL 10 MG RE SUPP: 10.0000 mg | Freq: Every day | RECTAL | Status: DC | PRN

## 2016-09-19 MED ORDER — DIPHENHYDRAMINE HCL 12.5 MG/5ML PO ELIX: 12.5000 mg | ORAL_SOLUTION | ORAL | Status: DC | PRN

## 2016-09-19 MED ORDER — NEOMYCIN-POLYMYXIN B GU 40-200000 IR SOLN
Status: DC | PRN
  Administered 2016-09-19: 16 mL

## 2016-09-19 MED ORDER — ONDANSETRON HCL 4 MG/2ML IJ SOLN
INTRAMUSCULAR | Status: DC | PRN
  Administered 2016-09-19: 4 mg via INTRAVENOUS

## 2016-09-19 MED ORDER — VANCOMYCIN HCL IN DEXTROSE 1-5 GM/200ML-% IV SOLN
INTRAVENOUS | Status: AC
  Administered 2016-09-19: 1 g via INTRAVENOUS
  Filled 2016-09-19: qty 200

## 2016-09-19 MED ORDER — ACETAMINOPHEN 325 MG PO TABS: 650.0000 mg | ORAL_TABLET | Freq: Four times a day (QID) | ORAL | Status: DC | PRN

## 2016-09-19 MED ORDER — ACETAMINOPHEN 325 MG RE SUPP: 10.0000 mg/kg | Freq: Once | RECTAL | Status: DC

## 2016-09-19 MED ORDER — ACETAMINOPHEN 10 MG/ML IV SOLN
INTRAVENOUS | Status: AC
  Filled 2016-09-19: qty 100

## 2016-09-19 MED ORDER — ONDANSETRON HCL 4 MG/2ML IJ SOLN: 4.0000 mg | Freq: Once | INTRAMUSCULAR | Status: DC | PRN

## 2016-09-19 MED ORDER — ACETAMINOPHEN 10 MG/ML IV SOLN
INTRAVENOUS | Status: DC | PRN
  Administered 2016-09-19: 1000 mg via INTRAVENOUS

## 2016-09-19 MED ORDER — ROCURONIUM BROMIDE 100 MG/10ML IV SOLN
INTRAVENOUS | Status: DC | PRN
  Administered 2016-09-19: 10 mg via INTRAVENOUS
  Administered 2016-09-19: 40 mg via INTRAVENOUS
  Administered 2016-09-19: 10 mg via INTRAVENOUS

## 2016-09-19 MED ORDER — PROPOFOL 10 MG/ML IV BOLUS
INTRAVENOUS | Status: DC | PRN
  Administered 2016-09-19: 70 mg via INTRAVENOUS

## 2016-09-19 MED ORDER — KCL IN DEXTROSE-NACL 20-5-0.9 MEQ/L-%-% IV SOLN
INTRAVENOUS | Status: DC
  Administered 2016-09-19: 23:00:00 via INTRAVENOUS
  Filled 2016-09-19 (×2): qty 1000

## 2016-09-19 MED ORDER — HYDROMORPHONE HCL 1 MG/ML IJ SOLN
0.5000 mg | INTRAMUSCULAR | Status: DC | PRN
  Administered 2016-09-21: 0.5 mg via INTRAVENOUS
  Administered 2016-09-21 (×2): 1 mg via INTRAVENOUS
  Filled 2016-09-19 (×3): qty 1

## 2016-09-19 MED ORDER — TRANEXAMIC ACID 1000 MG/10ML IV SOLN
INTRAVENOUS | Status: AC
  Filled 2016-09-19: qty 10

## 2016-09-19 MED ORDER — ENOXAPARIN SODIUM 40 MG/0.4ML ~~LOC~~ SOLN
40.0000 mg | SUBCUTANEOUS | Status: DC
Start: 2016-09-20 — End: 2016-09-20
  Filled 2016-09-19: qty 0.4

## 2016-09-19 MED ORDER — PANTOPRAZOLE SODIUM 40 MG PO TBEC
40.0000 mg | DELAYED_RELEASE_TABLET | Freq: Every day | ORAL | Status: DC
  Administered 2016-09-20 – 2016-09-22 (×3): 40 mg via ORAL
  Filled 2016-09-19 (×3): qty 1

## 2016-09-19 MED ORDER — FENTANYL CITRATE (PF) 100 MCG/2ML IJ SOLN
25.0000 ug | INTRAMUSCULAR | Status: DC | PRN
  Administered 2016-09-19 (×3): 25 ug via INTRAVENOUS

## 2016-09-19 MED ORDER — SODIUM CHLORIDE 0.9 % IJ SOLN
INTRAMUSCULAR | Status: AC
  Filled 2016-09-19: qty 50

## 2016-09-19 MED ORDER — DONEPEZIL HCL 5 MG PO TABS: 20.0000 mg | ORAL_TABLET | Freq: Every evening | ORAL | Status: DC | PRN

## 2016-09-19 MED ORDER — OXYCODONE HCL 5 MG PO TABS
5.0000 mg | ORAL_TABLET | ORAL | Status: DC | PRN
  Administered 2016-09-20 – 2016-09-21 (×3): 5 mg via ORAL
  Administered 2016-09-21 – 2016-09-22 (×2): 10 mg via ORAL
  Filled 2016-09-19 (×2): qty 1
  Filled 2016-09-19 (×2): qty 2
  Filled 2016-09-19: qty 1

## 2016-09-19 MED ORDER — VITAMIN D 1000 UNITS PO TABS
1000.0000 [IU] | ORAL_TABLET | Freq: Every day | ORAL | Status: DC
  Administered 2016-09-20 – 2016-09-22 (×3): 1000 [IU] via ORAL
  Filled 2016-09-19 (×3): qty 1

## 2016-09-19 MED ORDER — ACETAMINOPHEN 650 MG RE SUPP: 650.0000 mg | Freq: Four times a day (QID) | RECTAL | Status: DC | PRN

## 2016-09-19 MED ORDER — FENTANYL CITRATE (PF) 100 MCG/2ML IJ SOLN
INTRAMUSCULAR | Status: AC
  Administered 2016-09-19: 25 ug via INTRAVENOUS
  Filled 2016-09-19: qty 2

## 2016-09-19 MED ORDER — SODIUM CHLORIDE 0.9 % IV SOLN
INTRAVENOUS | Status: DC | PRN
  Administered 2016-09-19: 60 mL

## 2016-09-19 MED ORDER — BISACODYL 5 MG PO TBEC: 5.0000 mg | DELAYED_RELEASE_TABLET | Freq: Every day | ORAL | Status: DC | PRN

## 2016-09-19 MED ORDER — KETAMINE HCL 50 MG/ML IJ SOLN
INTRAMUSCULAR | Status: DC | PRN
  Administered 2016-09-19: 25 mg via INTRAVENOUS

## 2016-09-19 MED ORDER — SODIUM CHLORIDE 0.9 % IJ SOLN
INTRAMUSCULAR | Status: DC | PRN
  Administered 2016-09-19: 40 mL via INTRAVENOUS

## 2016-09-19 MED ORDER — BUPIVACAINE-EPINEPHRINE (PF) 0.5% -1:200000 IJ SOLN
INTRAMUSCULAR | Status: DC | PRN
  Administered 2016-09-19: 30 mL via PERINEURAL

## 2016-09-19 MED ORDER — FLEET ENEMA 7-19 GM/118ML RE ENEM: 1.0000 | ENEMA | Freq: Once | RECTAL | Status: DC | PRN

## 2016-09-19 MED ORDER — FENTANYL CITRATE (PF) 100 MCG/2ML IJ SOLN
INTRAMUSCULAR | Status: DC | PRN
  Administered 2016-09-19 (×2): 50 ug via INTRAVENOUS

## 2016-09-19 MED ORDER — OCUVITE-LUTEIN PO CAPS
ORAL_CAPSULE | Freq: Every day | ORAL | Status: DC
  Administered 2016-09-20 – 2016-09-22 (×3): 1 via ORAL
  Filled 2016-09-19 (×3): qty 1

## 2016-09-19 MED ORDER — VANCOMYCIN HCL IN DEXTROSE 1-5 GM/200ML-% IV SOLN
1000.0000 mg | Freq: Two times a day (BID) | INTRAVENOUS | Status: AC
  Administered 2016-09-20: 1000 mg via INTRAVENOUS
  Filled 2016-09-19: qty 200

## 2016-09-19 MED ORDER — TRAMADOL HCL 50 MG PO TABS
50.0000 mg | ORAL_TABLET | Freq: Four times a day (QID) | ORAL | Status: DC | PRN
  Administered 2016-09-19 – 2016-09-20 (×2): 50 mg via ORAL
  Filled 2016-09-19 (×2): qty 1

## 2016-09-19 MED ORDER — LACTATED RINGERS IV SOLN
INTRAVENOUS | Status: DC
  Administered 2016-09-19: 13:00:00 via INTRAVENOUS

## 2016-09-19 MED ORDER — EPHEDRINE SULFATE 50 MG/ML IJ SOLN
INTRAMUSCULAR | Status: DC | PRN
  Administered 2016-09-19 (×4): 5 mg via INTRAVENOUS

## 2016-09-19 MED ORDER — ACETAMINOPHEN 500 MG PO TABS
1000.0000 mg | ORAL_TABLET | Freq: Four times a day (QID) | ORAL | Status: AC
  Administered 2016-09-19 – 2016-09-20 (×3): 1000 mg via ORAL
  Filled 2016-09-19 (×3): qty 2

## 2016-09-19 MED ORDER — NEOMYCIN-POLYMYXIN B GU 40-200000 IR SOLN
Status: AC
  Filled 2016-09-19: qty 20

## 2016-09-19 MED ORDER — ACETAMINOPHEN 160 MG/5ML PO SOLN: 10.0000 mg/kg | Freq: Once | ORAL | Status: DC

## 2016-09-19 MED ORDER — LIDOCAINE HCL (PF) 4 % IJ SOLN
INTRAMUSCULAR | Status: DC | PRN
Start: 2016-09-19 — End: 2016-09-19
  Administered 2016-09-19: 4 mL via RESPIRATORY_TRACT

## 2016-09-19 MED ORDER — DOCUSATE SODIUM 100 MG PO CAPS
100.0000 mg | ORAL_CAPSULE | Freq: Two times a day (BID) | ORAL | Status: DC
  Administered 2016-09-19 – 2016-09-22 (×5): 100 mg via ORAL
  Filled 2016-09-19 (×5): qty 1

## 2016-09-19 MED ORDER — BUPIVACAINE LIPOSOME 1.3 % IJ SUSP
INTRAMUSCULAR | Status: AC
  Filled 2016-09-19: qty 20

## 2016-09-19 MED ORDER — METOCLOPRAMIDE HCL 10 MG PO TABS: 5.0000 mg | ORAL_TABLET | Freq: Three times a day (TID) | ORAL | Status: DC | PRN

## 2016-09-19 MED ORDER — LIDOCAINE HCL (CARDIAC) 20 MG/ML IV SOLN
INTRAVENOUS | Status: DC | PRN
  Administered 2016-09-19: 60 mg via INTRAVENOUS

## 2016-09-19 MED ORDER — ONDANSETRON HCL 4 MG PO TABS: 4.0000 mg | ORAL_TABLET | Freq: Four times a day (QID) | ORAL | Status: DC | PRN

## 2016-09-19 MED ORDER — PHENYLEPHRINE HCL 10 MG/ML IJ SOLN
INTRAMUSCULAR | Status: AC
  Filled 2016-09-19: qty 1

## 2016-09-19 MED ORDER — VANCOMYCIN HCL IN DEXTROSE 1-5 GM/200ML-% IV SOLN
1000.0000 mg | Freq: Once | INTRAVENOUS | Status: AC
  Administered 2016-09-19: 1 g via INTRAVENOUS

## 2016-09-19 MED ORDER — SUGAMMADEX SODIUM 200 MG/2ML IV SOLN
INTRAVENOUS | Status: DC | PRN
Start: 2016-09-19 — End: 2016-09-19
  Administered 2016-09-19: 90 mg via INTRAVENOUS

## 2016-09-19 SURGICAL SUPPLY — 77 items
8324 ×1 IMPLANT
BIT DRILL TWIST 2.7 (BIT) ×1 IMPLANT
BLADE SAGITTAL WIDE XTHICK NO (BLADE) ×2 IMPLANT
BONE CEMENT PALACOSE (Orthopedic Implant) ×2 IMPLANT
CANISTER SUCT 1200ML W/VALVE (MISCELLANEOUS) ×2 IMPLANT
CANISTER SUCT 3000ML PPV (MISCELLANEOUS) ×4 IMPLANT
CAPT SHLDR REVTOTAL 2 ×1 IMPLANT
CATH TRAY METER 16FR LF (MISCELLANEOUS) ×2 IMPLANT
CEMENT BONE PALACOSE (Orthopedic Implant) IMPLANT
CHLORAPREP W/TINT 26ML (MISCELLANEOUS) ×4 IMPLANT
CNTNR SPEC 2.5X3XGRAD LEK (MISCELLANEOUS) ×1
CONT SPEC 4OZ STER OR WHT (MISCELLANEOUS) ×1
CONT SPEC 4OZ STRL OR WHT (MISCELLANEOUS) ×1
CONTAINER SPEC 2.5X3XGRAD LEK (MISCELLANEOUS) IMPLANT
COOLER POLAR GLACIER W/PUMP (MISCELLANEOUS) ×2 IMPLANT
CRADLE LAMINECT ARM (MISCELLANEOUS) ×2 IMPLANT
DRAPE IMP U-DRAPE 54X76 (DRAPES) ×4 IMPLANT
DRAPE INCISE IOBAN 66X45 STRL (DRAPES) ×4 IMPLANT
DRAPE INCISE IOBAN 66X60 STRL (DRAPES) ×2 IMPLANT
DRAPE SHEET LG 3/4 BI-LAMINATE (DRAPES) ×4 IMPLANT
DRAPE TABLE BACK 80X90 (DRAPES) ×2 IMPLANT
DRSG OPSITE POSTOP 4X8 (GAUZE/BANDAGES/DRESSINGS) ×2 IMPLANT
ELECT CAUTERY BLADE 6.4 (BLADE) ×2 IMPLANT
GAUZE PACK 2X3YD (MISCELLANEOUS) ×1 IMPLANT
GLOVE BIO SURGEON STRL SZ7.5 (GLOVE) ×4 IMPLANT
GLOVE BIO SURGEON STRL SZ8 (GLOVE) ×5 IMPLANT
GLOVE BIOGEL PI IND STRL 8 (GLOVE) ×5 IMPLANT
GLOVE BIOGEL PI INDICATOR 8 (GLOVE) ×5
GLOVE INDICATOR 8.0 STRL GRN (GLOVE) ×2 IMPLANT
GOWN STRL REUS W/ TWL LRG LVL3 (GOWN DISPOSABLE) ×2 IMPLANT
GOWN STRL REUS W/ TWL XL LVL3 (GOWN DISPOSABLE) ×1 IMPLANT
GOWN STRL REUS W/TWL LRG LVL3 (GOWN DISPOSABLE) ×4
GOWN STRL REUS W/TWL XL LVL3 (GOWN DISPOSABLE) ×4
HANDPIECE INTERPULSE COAX TIP (DISPOSABLE) ×2
HOOD PEEL AWAY FLYTE STAYCOOL (MISCELLANEOUS) ×6 IMPLANT
KIT RM TURNOVER STRD PROC AR (KITS) ×2 IMPLANT
KIT STABILIZATION SHOULDER (MISCELLANEOUS) ×2 IMPLANT
MASK FACE SPIDER DISP (MASK) ×2 IMPLANT
NDL 18GX1X1/2 (RX/OR ONLY) (NEEDLE) ×1 IMPLANT
NDL HYPO 25X1 1.5 SAFETY (NEEDLE) ×1 IMPLANT
NDL MAYO CATGUT SZ1 (NEEDLE) ×1 IMPLANT
NDL MAYO CATGUT SZ4 TPR NDL (NEEDLE) ×1 IMPLANT
NDL MAYO CATGUT SZ5 (NEEDLE)
NDL SAFETY 22GX1.5 (NEEDLE) ×2 IMPLANT
NDL SAFETY ECLIPSE 18X1.5 (NEEDLE) IMPLANT
NDL SPNL 20GX3.5 QUINCKE YW (NEEDLE) ×1 IMPLANT
NDL SUT 5 .5 CRC TPR PNT MAYO (NEEDLE) ×1 IMPLANT
NEEDLE 18GX1X1/2 (RX/OR ONLY) (NEEDLE) ×2 IMPLANT
NEEDLE HYPO 18GX1.5 SHARP (NEEDLE) ×2
NEEDLE HYPO 25X1 1.5 SAFETY (NEEDLE) ×2 IMPLANT
NEEDLE MAYO CATGUT SZ1 (NEEDLE) IMPLANT
NEEDLE MAYO CATGUT SZ4 (NEEDLE) IMPLANT
NEEDLE SPNL 20GX3.5 QUINCKE YW (NEEDLE) ×2 IMPLANT
NS IRRIG 1000ML POUR BTL (IV SOLUTION) ×2 IMPLANT
PACK ARTHROSCOPY SHOULDER (MISCELLANEOUS) ×2 IMPLANT
PAD WRAPON POLAR SHDR UNIV (MISCELLANEOUS) ×1 IMPLANT
PIN THREADED REVERSE (PIN) ×1 IMPLANT
SET HNDPC FAN SPRY TIP SCT (DISPOSABLE) ×1 IMPLANT
SLING ULTRA II M (MISCELLANEOUS) ×2 IMPLANT
SOL .9 NS 3000ML IRR  AL (IV SOLUTION) ×1
SOL .9 NS 3000ML IRR AL (IV SOLUTION) ×1
SOL .9 NS 3000ML IRR UROMATIC (IV SOLUTION) ×1 IMPLANT
SPONGE LAP 18X18 5 PK (GAUZE/BANDAGES/DRESSINGS) ×2 IMPLANT
STAPLER SKIN PROX 35W (STAPLE) ×2 IMPLANT
SUT ETHIBOND 0 MO6 C/R (SUTURE) ×2 IMPLANT
SUT ETHIBOND NAB CT1 #1 30IN (SUTURE) ×2 IMPLANT
SUT FIBERWIRE #2 38 BLUE 1/2 (SUTURE) ×2
SUT VIC AB 0 CT1 36 (SUTURE) ×4 IMPLANT
SUT VIC AB 2-0 CT1 27 (SUTURE) ×6
SUT VIC AB 2-0 CT1 TAPERPNT 27 (SUTURE) ×2 IMPLANT
SUTURE FIBERWR #2 38 BLUE 1/2 (SUTURE) ×1 IMPLANT
SYR 20CC LL (SYRINGE) ×1 IMPLANT
SYR 30ML LL (SYRINGE) ×4 IMPLANT
SYRINGE 10CC LL (SYRINGE) ×2 IMPLANT
SYSTEM VACUUM CEMENT MIXING ×1 IMPLANT
TUBE CONNECTING 6X3/16 (MISCELLANEOUS) ×2 IMPLANT
WRAPON POLAR PAD SHDR UNIV (MISCELLANEOUS) ×2

## 2016-09-19 NOTE — Progress Notes (Addendum)
Spoke with Dr. Rudene Christians about pt BMP results. Dr. Rudene Christians believes results may be wrong. He gave order to stop D5 ns with 20kcl. Redraw BMP. Ns 48ml/hr ordered.

## 2016-09-19 NOTE — Progress Notes (Signed)
Pt arrived from the PACU via bed, family members present in room. Pt has her eyes open, moves her eyes, tracks this writer's movements, is breathing with her mouth open. Oral membranes are dry with poor dentition noted. Pt is on room air, lungs are clear bilat. Pt is placedo ntelemetry with continual O2 sat monitoring, per PACU, pt has not spoken since surgery. Family at bedside is unclear how much pt normally speaks. Pt hs a honeycomb dressing noted to R shoulder, polarcare in place with immopbilizer. R hand is edematous and warm, pt can move her fingers when touched but does not follow commands. Cap refill to all extremities is wnl. Abdomen is soft, bs heard. Foley draining yellow urine, ppp, thigh high teds in place with foot pumps. Pt has a dressing to her R lower leg dated for 11/10, nearly entire R shin is ecchymotic, bruising noted to bilat kness, bilat arms, and to her back as well. Pt cried out in pain when rolled over. PIV #20 intact to L wrist, site is free of redness and swelling. Pth as long fingernails, toenails and lower legs are discolored. Attempt made to orient pt to place, time, situation with pt making no reply.

## 2016-09-19 NOTE — Anesthesia Preprocedure Evaluation (Addendum)
Anesthesia Evaluation  Patient identified by MRN, date of birth, ID band Patient awake    Reviewed: Allergy & Precautions, NPO status , Patient's Chart, lab work & pertinent test results  History of Anesthesia Complications Negative for: history of anesthetic complications  Airway Mallampati: III  TM Distance: <3 FB    Comment: Pt not able to understand the request to open her mouth. Dental   Pulmonary neg pulmonary ROS,    Pulmonary exam normal        Cardiovascular hypertension, Pt. on medications + Peripheral Vascular Disease  Normal cardiovascular exam     Neuro/Psych Dementia, almost nonverbal    GI/Hepatic Neg liver ROS, GERD  ,  Endo/Other  negative endocrine ROS  Renal/GU negative Renal ROS     Musculoskeletal   Abdominal Normal abdominal exam  (+)   Peds  Hematology negative hematology ROS (+)   Anesthesia Other Findings   Reproductive/Obstetrics                                                             Anesthesia Evaluation  Patient identified by MRN, date of birth, ID band Patient awake    Reviewed: Allergy & Precautions, NPO status , Patient's Chart, lab work & pertinent test results  History of Anesthesia Complications Negative for: history of anesthetic complications  Airway       Comment: Pt not able to understand the request to open her mouth. Dental   Pulmonary neg pulmonary ROS,           Cardiovascular hypertension, Pt. on medications + Peripheral Vascular Disease       Neuro/Psych Dementia, almost nonverbal    GI/Hepatic Neg liver ROS, GERD  ,  Endo/Other  negative endocrine ROS  Renal/GU negative Renal ROS     Musculoskeletal   Abdominal   Peds  Hematology negative hematology ROS (+)   Anesthesia Other Findings   Reproductive/Obstetrics                             Anesthesia Physical Anesthesia  Plan  ASA: III  Anesthesia Plan: General   Post-op Pain Management:    Induction: Intravenous  Airway Management Planned: Oral ETT  Additional Equipment:   Intra-op Plan:   Post-operative Plan:   Informed Consent: I have reviewed the patients History and Physical, chart, labs and discussed the procedure including the risks, benefits and alternatives for the proposed anesthesia with the patient or authorized representative who has indicated his/her understanding and acceptance.     Plan Discussed with:   Anesthesia Plan Comments:         Anesthesia Quick Evaluation  Anesthesia Physical Anesthesia Plan  ASA: III  Anesthesia Plan: General   Post-op Pain Management:    Induction: Intravenous  Airway Management Planned: Oral ETT  Additional Equipment:   Intra-op Plan:   Post-operative Plan:   Informed Consent: I have reviewed the patients History and Physical, chart, labs and discussed the procedure including the risks, benefits and alternatives for the proposed anesthesia with the patient or authorized representative who has indicated his/her understanding and acceptance.     Plan Discussed with:   Anesthesia Plan Comments:         Anesthesia Quick Evaluation

## 2016-09-19 NOTE — Pre-Procedure Instructions (Signed)
Son , Garner Gavel was contacted via phone to  obtain consent to perform lab work draw prior to surgery. Witnessed by Rivka Spring, and Charlyne Quale.

## 2016-09-19 NOTE — Anesthesia Procedure Notes (Signed)
Procedure Name: Intubation Date/Time: 09/19/2016 1:50 PM Performed by: Rosaria Ferries, Andy Moye Pre-anesthesia Checklist: Patient identified, Emergency Drugs available, Suction available and Patient being monitored Patient Re-evaluated:Patient Re-evaluated prior to inductionOxygen Delivery Method: Circle system utilized Preoxygenation: Pre-oxygenation with 100% oxygen Intubation Type: IV induction Laryngoscope Size: Mac and 3 Grade View: Grade I Tube size: 7.0 mm Number of attempts: 1 Placement Confirmation: ETT inserted through vocal cords under direct vision,  positive ETCO2 and breath sounds checked- equal and bilateral Secured at: 21 cm Tube secured with: Tape Dental Injury: Teeth and Oropharynx as per pre-operative assessment

## 2016-09-19 NOTE — Transfer of Care (Signed)
Immediate Anesthesia Transfer of Care Note  Patient: Deborah Espinoza  Procedure(s) Performed: Procedure(s): REVERSE RIGHT TOTAL SHOULDER ARTHROPLASTY (Right)  Patient Location: PACU  Anesthesia Type:General  Level of Consciousness: sedated  Airway & Oxygen Therapy: Patient Spontanous Breathing and Patient connected to face mask oxygen  Post-op Assessment: Report given to RN and Post -op Vital signs reviewed and stable  Post vital signs: Reviewed and stable  Last Vitals:  Vitals:   09/19/16 1258  BP: (!) 155/98  Pulse: 78  Resp: 16  Temp: 37 C    Last Pain:  Vitals:   09/19/16 1258  TempSrc: Oral         Complications: No apparent anesthesia complications

## 2016-09-19 NOTE — H&P (Signed)
Paper H&P to be scanned into permanent record. H&P reviewed. No changes. The patient presents today for a reverse right total shoulder arthroplasty to treat her displaced right proximal humerus fracture. This procedure has been discussed with the patient's husband and son, as have the potential risks and benefits. Their questions have been answered to their satisfaction.

## 2016-09-19 NOTE — Op Note (Signed)
09/19/2016  4:29 PM  Patient:   Deborah Espinoza  Pre-Op Diagnosis:   Closed displaced proximal humerus fracture, right shoulder.  Post-Op Diagnosis:   Same.  Procedure:   Reverse right total shoulder arthroplasty.  Surgeon:   Pascal Lux, MD  Assistant:   Rachelle Hora, PA-C  Anesthesia:   GET  Findings:   As above.  Complications:   None  EBL:  200 cc  Fluids:   1000 cc crystalloid  UOP:  150 cc  TT:   None  Drains:   None  Closure:   Staples  Implants:   Biomet Comprehensive system with a #8 cemented humeral fracture stem, a 44 mm humeral tray with a standard insert, and a mini-base plate with a 36 mm glenosphere.  Brief Clinical Note:   The patient is a pleasantly demented 82 year old female who sustained the above-noted injury 10 days ago when she fell after getting up to wander about her room. She was admitted for medical stabilization, then sent back to her rehab facility and presents at this time for a reverse right total shoulder arthroplasty.  Procedure:   The patient was brought into the operating room and lain in the supine position on the OR table. After adequate general endotracheal intubation and anesthesia was obtained, a Foley catheter was inserted before the patient repositioned in the beach chair position using the beach chair positioner. The right shoulder and upper extremity were prepped with ChloraPrep solution before being draped sterilely. Preoperative antibiotics were administered. A timeout was performed to verify the appropriate surgical site. A standard anterior approach to the shoulder was made through an approximately 4-5 inch incision. The incision was carried down through the subcutaneous tissues to expose the deltopectoral fascia. The interval between the deltoid and pectoralis muscles was identified and this plane developed, retracting the cephalic vein laterally with the deltoid muscle. The cephalic vein began to bleed during the dissection,  so it was tied off using 2-0 Vicryl interrupted sutures. The conjoined tendon was identified and noted to be somewhat frayed due to the proximal end of the humeral shaft abutting it. Its lateral margin was dissected and the Kolbel self-retraining retractor inserted. The subscapularis tendon was released from its attachment to the lesser tuberosity 1 cm proximal to its insertion and several tagging sutures placed. The humeral head was dissected free and removed, releasing the superior and posterior superior portions of the rotator cuff in the process. The biceps tendon also was released from its labral attachment and tagged. This tended later was sutured to the soft tissues adjacent to it to perform a soft tissue tenodesis before removing the excess portion of the tendon.  Attention was redirected to the glenoid. The labrum was debrided circumferentially before the center of the glenoid was marked with electrocautery. The guidewire was drilled into the glenoid neck using the appropriate guide. After verifying its position, it was overreamed with the mini-baseplate reamer to create a flat surface. The permanent mini-baseplate was impacted into place. It was stabilized with a 25 x 6.5 mm central screw and four peripheral screws. Locking screws were placed superiorly, inferiorly, and posteriorly while a nonlocking was placed anteriorly. The permanent 36 mm glenosphere was then impacted into place and its Morse taper locking mechanism verified using manual distraction.  Attention was directed to the humeral side. The humeral canal was reamed sequentially beginning with the end-cutting reamer then progressing from a 4 mm reamer up to a 10 mm reamer. This provided excellent circumferential  chatter. The canal was tapped with a 10 mm tap to the appropriate depth before the 10 mm centralizer was inserted and twisted into place, again to the appropriate depth. The canal was prepared for cementing by irrigating and  thoroughly with bacitracin saline solution before packing it with a Neo-Synephrine soaked vaginal pack. Meanwhile, cement was mixed on the back table. When it was ready, the cement was injected into the well before implanting the femoral stem at the appropriate amount of version (approximately 30 of retroversion). The excess cement was removed using a Soil scientist. Once the cement had hardened, a trial reduction performed using the standard trial humeral platform. The arm demonstrated satisfactory range of motion as the hand could be brought across the chest to the opposite shoulder and brought to the patient's ear. The shoulder appeared stable throughout this range of motion. The joint was dislocated and the trial components removed. The permanent 44 mm humeral platform with the standard insert was put together on the back table and impacted into place. Again, the [facility] taper locking mechanism was verified using manual distraction. The shoulder was relocated and again placed through a range of motion with the findings as described above.  The wound was copiously irrigated with bacitracin saline solution using the jet lavage system before a total of 20 cc of Exparel diluted out to 60 cc with normal saline and 30 cc of 0.5% Sensorcaine with epinephrine was injected into the pericapsular and peri-incisional tissues to help with postoperative analgesia. It was elected not to repair the subscapularis tendon due to the poor quality of tissue. The deltopectoral interval was closed using #0 Vicryl interrupted sutures before the subcutaneous tissues were closed using 2-0 Vicryl interrupted sutures. The skin was closed using staples. Prior to closing the skin, 1 g of transexemic acid in 10 cc of normal saline was injected intra-articularly to help with postoperative bleeding. A sterile occlusive dressing was applied to the wound before the arm was placed into a shoulder immobilizer with an abduction pillow. A Polar Care  system also was applied to the shoulder. The patient was then transferred back to a hospital bed before being awakened, extubated, and returned to the recovery room in satisfactory condition after tolerating the procedure well.

## 2016-09-19 NOTE — Anesthesia Postprocedure Evaluation (Signed)
Anesthesia Post Note  Patient: Deborah Espinoza  Procedure(s) Performed: Procedure(s) (LRB): REVERSE RIGHT TOTAL SHOULDER ARTHROPLASTY (Right)  Patient location during evaluation: PACU Anesthesia Type: General Level of consciousness: awake Pain management: pain level controlled Vital Signs Assessment: post-procedure vital signs reviewed and stable Respiratory status: spontaneous breathing Cardiovascular status: blood pressure returned to baseline Anesthetic complications: no Comments: Patient is nonverbal as preop with high degree of dementia.  Exchanging well with good sats    Last Vitals:  Vitals:   09/19/16 1732 09/19/16 1740  BP: (!) 167/76 (!) 168/89  Pulse: 78 76  Resp: 18 16  Temp:  36.1 C    Last Pain:  Vitals:   09/19/16 1258  TempSrc: Oral                 Ory Elting

## 2016-09-20 ENCOUNTER — Encounter: Payer: Self-pay | Admitting: Surgery

## 2016-09-20 LAB — CBC WITH DIFFERENTIAL/PLATELET
BASOS PCT: 0 %
Basophils Absolute: 0.1 10*3/uL (ref 0–0.1)
EOS ABS: 0.2 10*3/uL (ref 0–0.7)
Eosinophils Relative: 1 %
HEMATOCRIT: 36.4 % (ref 35.0–47.0)
HEMOGLOBIN: 12 g/dL (ref 12.0–16.0)
LYMPHS ABS: 1.4 10*3/uL (ref 1.0–3.6)
Lymphocytes Relative: 9 %
MCH: 31.5 pg (ref 26.0–34.0)
MCHC: 33 g/dL (ref 32.0–36.0)
MCV: 95.4 fL (ref 80.0–100.0)
MONO ABS: 1.2 10*3/uL — AB (ref 0.2–0.9)
MONOS PCT: 7 %
NEUTROS PCT: 83 %
Neutro Abs: 14.2 10*3/uL — ABNORMAL HIGH (ref 1.4–6.5)
Platelets: 222 10*3/uL (ref 150–440)
RBC: 3.82 MIL/uL (ref 3.80–5.20)
RDW: 14.9 % — AB (ref 11.5–14.5)
WBC: 17.1 10*3/uL — ABNORMAL HIGH (ref 3.6–11.0)

## 2016-09-20 LAB — BASIC METABOLIC PANEL
Anion gap: 5 (ref 5–15)
BUN: 26 mg/dL — ABNORMAL HIGH (ref 6–20)
CALCIUM: 10.8 mg/dL — AB (ref 8.9–10.3)
CHLORIDE: 111 mmol/L (ref 101–111)
CO2: 27 mmol/L (ref 22–32)
CREATININE: 1.13 mg/dL — AB (ref 0.44–1.00)
GFR calc Af Amer: 51 mL/min — ABNORMAL LOW (ref >60)
GFR calc non Af Amer: 44 mL/min — ABNORMAL LOW (ref >60)
GLUCOSE: 111 mg/dL — AB (ref 65–99)
Potassium: 4.6 mmol/L (ref 3.5–5.1)
Sodium: 143 mmol/L (ref 135–145)

## 2016-09-20 LAB — URINE CULTURE: Special Requests: NORMAL

## 2016-09-20 LAB — SURGICAL PCR SCREEN
MRSA, PCR: NEGATIVE
STAPHYLOCOCCUS AUREUS: NEGATIVE

## 2016-09-20 MED ORDER — OXYCODONE HCL 5 MG PO TABS: 5.0000 mg | ORAL_TABLET | ORAL | 0 refills | Status: DC | PRN

## 2016-09-20 MED ORDER — TRAMADOL HCL 50 MG PO TABS: 50.0000 mg | ORAL_TABLET | Freq: Four times a day (QID) | ORAL | 0 refills | Status: AC | PRN

## 2016-09-20 MED ORDER — ENOXAPARIN SODIUM 30 MG/0.3ML ~~LOC~~ SOLN
30.0000 mg | SUBCUTANEOUS | Status: DC
  Administered 2016-09-20 – 2016-09-22 (×3): 30 mg via SUBCUTANEOUS
  Filled 2016-09-20 (×3): qty 0.3

## 2016-09-20 NOTE — Progress Notes (Signed)
Subjective: 1 Day Post-Op Procedure(s) (LRB): REVERSE RIGHT TOTAL SHOULDER ARTHROPLASTY (Right) Patient reports pain as mild.   Patient is well, and has had no acute complaints or problems Plan is to go Skilled nursing facility after hospital stay. Negative for chest pain and shortness of breath Fever: no Gastrointestinal:Negative for nausea and vomiting  Objective: Vital signs in last 24 hours: Temp:  [96.8 F (36 C)-98.6 F (37 C)] 98.4 F (36.9 C) (11/14 0319) Pulse Rate:  [58-109] 66 (11/14 0812) Resp:  [12-20] 18 (11/14 0812) BP: (147-194)/(52-100) 151/54 (11/14 0812) SpO2:  [90 %-100 %] 100 % (11/14 0812)  Intake/Output from previous day:  Intake/Output Summary (Last 24 hours) at 09/20/16 1007 Last data filed at 09/20/16 0530  Gross per 24 hour  Intake          2058.75 ml  Output              900 ml  Net          1158.75 ml    Intake/Output this shift: No intake/output data recorded.  Labs:  Recent Labs  09/19/16 1203 09/20/16 0633  HGB 15.3 12.0    Recent Labs  09/19/16 1203 09/20/16 0633  WBC 18.7* 17.1*  RBC 4.94 3.82  HCT 46.0 36.4  PLT 319 222    Recent Labs  09/19/16 2208 09/20/16 0633  NA 147* 143  K 6.1* 4.6  CL 116* 111  CO2 24 27  BUN 34* 26*  CREATININE 1.21* 1.13*  GLUCOSE 179* 111*  CALCIUM 11.0* 10.8*    Recent Labs  09/19/16 1203  INR 0.89     EXAM General - Patient is Alert however for the majority of the exam non-verbal, patient did begin answering questions towards the end but unclear of patient's previous baseline. Extremity - ABD soft Sensation intact distally Incision: scant drainage No cellulitis present Dressing/Incision - blood tinged drainage at the distal aspect of the incision. Motor Function - intact, moving foot and toes well on exam.  Patient is intact to light touch in the distribution of the right axillary nerve.  Past Medical History:  Diagnosis Date  . Allergic rhinitis, cause unspecified    . Breast cancer ([place])    right  . Cataracts, bilateral    complications from eye surgery  . Dizziness and giddiness   . Edema   . Elevated blood pressure reading without diagnosis of hypertension   . External hemorrhoids without mention of complication   . GERD (gastroesophageal reflux disease)   . Lichen, unspecified    sclerosis  . Memory loss   . Mixed hyperlipidemia   . Osteoporosis, unspecified   . Other constipation   . Personal history of colonic polyps   . Sprain of neck   . Unspecified constipation   . Unspecified essential hypertension   . Unspecified hearing loss   . Urticaria, unspecified   . 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   Weight as of 09/12/16: 43.4 kg (95 lb 11.2 oz). Advance diet Up with therapy D/C IV fluids when tolerating PO intake.  Labs reviewed this AM, WBC down to 17.1 from 18.7 yesterday. Acute renal failure, BUN 26 and Cr 1.13. Hg stable.  Pt is quiet this AM, unsure of previous mental baseline, will work with PT today and plan to possibly be discharged to SNF later this afternoon.  DVT Prophylaxis - Lovenox, Foot Pumps and TED hose Non-weightbearing to the right upper extremity.  Raquel James, PA-C [facility] 09/20/2016, 10:07 AM

## 2016-09-20 NOTE — Progress Notes (Signed)
Enoxaparin   Patient qualifies for Enoxaparin 30  mg SQ daily based on CrCl <30 ml/min per policy. Will change to Enoxaparin 30 mg SQ daily.  Isacc Turney D. Alma Mohiuddin, PharmD   

## 2016-09-20 NOTE — Discharge Instructions (Signed)
Diet: As you were doing prior to hospitalization   Shower:  May shower but keep the wounds dry, use an occlusive plastic wrap, NO SOAKING IN TUB.  If the bandage gets wet, change with a clean dry gauze.  Dressing:  You may change your dressing as needed. Change the dressing with sterile gauze dressing.    Activity:  Increase activity slowly as tolerated, but follow the weight bearing instructions below.  No lifting or driving for 6 weeks.  Weight Bearing:   Non-weight bearing to the right upper extremity.  DVT Prophylaxis:  For the next 14 days, take over the counter ASA 81mg  twice a day.  To prevent constipation: you may use a stool softener such as -  Colace (over the counter) 100 mg by mouth twice a day  Drink plenty of fluids (prune juice may be helpful) and high fiber foods Miralax (over the counter) for constipation as needed.    Itching:  If you experience itching with your medications, try taking only a single pain pill, or even half a pain pill at a time.  You may take up to 10 pain pills per day, and you can also use benadryl over the counter for itching or also to help with sleep.   Precautions:  If you experience chest pain or shortness of breath - call 911 immediately for transfer to the hospital emergency department!!  If you develop a fever greater that 101 F, purulent drainage from wound, increased redness or drainage from wound, or calf pain-Call [place]                                               Follow- Up Appointment:  Please call for an appointment to be seen in 2 weeks at [facility]

## 2016-09-20 NOTE — Progress Notes (Signed)
Pt alert but very confused. Speaks on occasion. Was able to tolerate clear liquid dinner of chicken broth last night. Drinking fluids without difficulty. Pt. Medicated for pain with good results. Agitated x1 pulling at brace and picking but was able to calm down. MD on call notified about BMP lab. Lab was not able to use the redrawn lab. Lab to come draw a new BMP this morning. PT. Has new IV that is infusing without difficulty. Surgical dressing remaining dry and intact. Pt is able to move fingers. Right arm and hand swollen and eccymotic.

## 2016-09-20 NOTE — Progress Notes (Signed)
Lab in pt room to redraw labs earlier specimens not viable.

## 2016-09-20 NOTE — Discharge Summary (Addendum)
Physician Discharge Summary  Patient ID: Deborah Espinoza MRN: [id number] DOB/AGE: Nov 09, 1932 82 y.o.  Admit date: 09/19/2016 Discharge date: 09/22/16  Admission Diagnoses:  FRACTURE OF HUMERUS NECK, RIGHT, CLOSED Closed displaced proximal humerus fracture of the right shoulder  Discharge Diagnoses: Patient Active Problem List   Diagnosis Date Noted  . Status post reverse total shoulder replacement, right 09/19/2016  . Shoulder fracture 09/13/2016  . Pressure injury of skin 09/13/2016  . Shoulder fracture, right, closed, initial encounter 09/12/2016  . Hyperparathyroidism, primary ([place]) 05/17/2015  . Abnormal TSH 05/08/2014  . Leukocytosis, unspecified 05/08/2014  . Hypokalemia 11/21/2012  . hx: breast cancer, right, DCIS, receptor + 11/28/2011  . Neuropathy ([place]) 09/27/2011  . NECK SPRAIN AND STRAIN 11/06/2010  . VARICOSE VEINS LOWER EXTREMITIES W/OTH COMPS 05/05/2010  . HEMORRHOIDS, EXTERNAL 01/06/2010  . Dementia without behavioral disturbance 07/23/2009  . PERIPHERAL EDEMA 04/03/2009  . Essential hypertension 01/22/2009  . DECREASED HEARING 09/02/2008  . CONSTIPATION, CHRONIC 04/21/2008  . HYPERLIPIDEMIA 08/30/2007  . COLONIC POLYPS, HX OF 08/30/2007  . ALLERGIC RHINITIS 08/06/2007  . LICHEN NOS [id number]  . Osteoporosis 08/06/2007  Closed displaced proximal humerus fracture of the right shoulder.  Past Medical History:  Diagnosis Date  . Allergic rhinitis, cause unspecified   . Breast cancer ([place])    right  . Cataracts, bilateral    complications from eye surgery  . Dizziness and giddiness   . Edema   . Elevated blood pressure reading without diagnosis of hypertension   . External hemorrhoids without mention of complication   . GERD (gastroesophageal reflux disease)   . Lichen, unspecified    sclerosis  . Memory loss   . Mixed hyperlipidemia   . Osteoporosis, unspecified   . Other constipation   . Personal history of colonic polyps   . Sprain of neck    . Unspecified constipation   . Unspecified essential hypertension   . Unspecified hearing loss   . Urticaria, unspecified   . Varicose veins of lower extremities with other complications      Transfusion: None   Consultants (if any):   Discharged Condition: Improved  Hospital Course: Deborah Espinoza is an 82 y.o. female who was admitted 09/19/2016 with a diagnosis of a closed, displaced proximal right humerus fracture and went to the operating room on 09/19/2016 and underwent the above named procedures.   Surgeries: Procedure(s): REVERSE RIGHT TOTAL SHOULDER ARTHROPLASTY on 09/19/2016 Patient tolerated the surgery well. Taken to PACU where she was stabilized and then transferred to the orthopedic floor.  Started on Lovenox 30mg  q 24 hrs. Foot pumps applied bilaterally at 80 mm. Heels elevated on bed with rolled towels. No evidence of DVT. Negative [place]. Physical therapy started on day #1 for gait training and transfer. OT started day #1 for ADL and assisted devices.   Patient's IV and Foley was d/c on POD1.  Implants: [employer] Comprehensive system with a #8 cemented humeral fracture stem, a 44 mm humeral tray with a standard insert, and a mini-base plate with a 36 mm glenosphere.  She was given perioperative antibiotics:  Anti-infectives    Start     Dose/Rate Route Frequency Ordered Stop   09/20/16 0030  vancomycin (VANCOCIN) IVPB 1000 mg/200 mL premix     1,000 mg 200 mL/hr over 60 Minutes Intravenous Every 12 hours 09/19/16 1802 09/20/16 0253   09/19/16 1245  vancomycin (VANCOCIN) IVPB 1000 mg/200 mL premix     1,000 mg 200 mL/hr over 60 Minutes Intravenous  Once 09/19/16 1231 09/19/16 1339    .  She was given sequential compression devices, early ambulation, and Lovenox for DVT prophylaxis.  She benefited maximally from the hospital stay and there were no complications.    Recent vital signs:  Vitals:   09/20/16 0319 09/20/16 0812  BP: (!) 153/76 (!) 151/54  Pulse:  67 66  Resp: 19 18  Temp: 98.4 F (36.9 C)     Recent laboratory studies:  Lab Results  Component Value Date   HGB 12.0 09/20/2016   HGB 15.3 09/19/2016   HGB 13.3 09/15/2016   Lab Results  Component Value Date   WBC 17.1 (H) 09/20/2016   PLT 222 09/20/2016   Lab Results  Component Value Date   INR 0.89 09/19/2016   Lab Results  Component Value Date   NA 143 09/20/2016   K 4.6 09/20/2016   CL 111 09/20/2016   CO2 27 09/20/2016   BUN 26 (H) 09/20/2016   CREATININE 1.13 (H) 09/20/2016   GLUCOSE 111 (H) 09/20/2016    Discharge Medications:     Medication List    TAKE these medications   acetaminophen 325 MG tablet Commonly known as:  TYLENOL Take 2 tablets (650 mg total) by mouth every 6 (six) hours as needed for mild pain (or Fever >/= 101).   amLODipine 5 MG tablet Commonly known as:  NORVASC Take 1 tablet (5 mg total) by mouth daily.   bisacodyl 5 MG EC tablet Commonly known as:  DULCOLAX Take 1 tablet (5 mg total) by mouth daily as needed for moderate constipation.   donepezil 10 MG tablet Commonly known as:  ARICEPT Take 2 tablets (20 mg total) by mouth at bedtime as needed.   oxyCODONE 5 MG immediate release tablet Commonly known as:  Oxy IR/ROXICODONE Take 1-2 tablets (5-10 mg total) by mouth every 4 (four) hours as needed for breakthrough pain.   PRESERVISION AREDS PO Take 1 tablet by mouth daily.   traMADol 50 MG tablet Commonly known as:  ULTRAM Take 1 tablet (50 mg total) by mouth every 6 (six) hours as needed for moderate pain.   VITAMIN D PO Take 1 tablet by mouth daily.       Diagnostic Studies: Dg Pelvis 1-2 Views  Result Date: 09/12/2016 CLINICAL DATA:  Bilateral hip pain after falls. EXAM: PELVIS - 1-2 VIEW COMPARISON:  None.- FINDINGS: Pelvic bony ring is intact. No gross abnormality to the hips. There is gas in the rectum. No evidence for a fracture. IMPRESSION: No acute bone abnormality. Electronically Signed   By: Markus Daft  M.D.   On: 09/12/2016 16:53   Dg Shoulder Right  Result Date: 09/12/2016 CLINICAL DATA:  Right shoulder pain since a fall out of bed 3 days ago. EXAM: RIGHT SHOULDER - 2+ VIEW COMPARISON:  None. FINDINGS: The patient has an acute surgical neck fracture of the right shoulder with foreshortening of approximately 3.5 cm and 1 shaft with anterior displacement. The fracture appears to involve the greater tuberosity. The humeral head remains located. The acromioclavicular joint is intact. No other acute abnormality is identified. IMPRESSION: Acute surgical neck fracture right humerus as described above. Electronically Signed   By: Inge Rise M.D.   On: 09/12/2016 19:08   Dg Knee 1-2 Views Left  Result Date: 09/12/2016 CLINICAL DATA:  Fall. EXAM: LEFT KNEE - 1-2 VIEW COMPARISON:  None. FINDINGS: No evidence of fracture, dislocation, or joint effusion. No evidence of arthropathy or other focal bone abnormality.  Soft tissues are unremarkable. IMPRESSION: Negative. Electronically Signed   By: Aletta Edouard M.D.   On: 09/12/2016 16:53   Ct Head Wo Contrast  Result Date: 09/12/2016 CLINICAL DATA:  Dementia, multiple falls x4 days, altered mental status EXAM: CT HEAD WITHOUT CONTRAST CT CERVICAL SPINE WITHOUT CONTRAST TECHNIQUE: Multidetector CT imaging of the head and cervical spine was performed following the standard protocol without intravenous contrast. Multiplanar CT image reconstructions of the cervical spine were also generated. COMPARISON:  Cervical spine radiographs dated 11/09/2010 FINDINGS: CT HEAD FINDINGS Brain: No evidence of acute infarction, hemorrhage, hydrocephalus, extra-axial collection or mass lesion/mass effect. Vascular: No hyperdense vessel or unexpected calcification. Skull: Normal. Negative for fracture or focal lesion. Sinuses/Orbits: Visualized paranasal sinuses are essentially clear. Right mastoid air cells are opacified. Other: Global cortical atrophy.  No ventriculomegaly.  Subcortical white matter and periventricular small vessel ischemic changes. Mild intracranial atherosclerosis. CT CERVICAL SPINE FINDINGS Alignment: Normal cervical lordosis. Skull base and vertebrae: No acute fracture. No primary bone lesion or focal pathologic process. Soft tissues and spinal canal: No prevertebral fluid or swelling. No visible canal hematoma. Disc levels:  Moderate degenerative changes at C5-6. Spinal canal remains patent. Upper chest: Visualized lung apices are notable for biapical pleural-parenchymal scarring. Other: Visualized thyroid is unremarkable. IMPRESSION: No evidence of acute intracranial abnormality. Atrophy with small vessel ischemic changes. No evidence of traumatic injury to the cervical spine. Moderate degenerative changes at C5-6. Electronically Signed   By: Julian Hy M.D.   On: 09/12/2016 16:10   Ct Cervical Spine Wo Contrast  Result Date: 09/12/2016 CLINICAL DATA:  Dementia, multiple falls x4 days, altered mental status EXAM: CT HEAD WITHOUT CONTRAST CT CERVICAL SPINE WITHOUT CONTRAST TECHNIQUE: Multidetector CT imaging of the head and cervical spine was performed following the standard protocol without intravenous contrast. Multiplanar CT image reconstructions of the cervical spine were also generated. COMPARISON:  Cervical spine radiographs dated 11/09/2010 FINDINGS: CT HEAD FINDINGS Brain: No evidence of acute infarction, hemorrhage, hydrocephalus, extra-axial collection or mass lesion/mass effect. Vascular: No hyperdense vessel or unexpected calcification. Skull: Normal. Negative for fracture or focal lesion. Sinuses/Orbits: Visualized paranasal sinuses are essentially clear. Right mastoid air cells are opacified. Other: Global cortical atrophy.  No ventriculomegaly. Subcortical white matter and periventricular small vessel ischemic changes. Mild intracranial atherosclerosis. CT CERVICAL SPINE FINDINGS Alignment: Normal cervical lordosis. Skull base and  vertebrae: No acute fracture. No primary bone lesion or focal pathologic process. Soft tissues and spinal canal: No prevertebral fluid or swelling. No visible canal hematoma. Disc levels:  Moderate degenerative changes at C5-6. Spinal canal remains patent. Upper chest: Visualized lung apices are notable for biapical pleural-parenchymal scarring. Other: Visualized thyroid is unremarkable. IMPRESSION: No evidence of acute intracranial abnormality. Atrophy with small vessel ischemic changes. No evidence of traumatic injury to the cervical spine. Moderate degenerative changes at C5-6. Electronically Signed   By: Julian Hy M.D.   On: 09/12/2016 16:10   Ct Shoulder Right Wo Contrast  Result Date: 09/13/2016 CLINICAL DATA:  Status post fall 4 days ago with a right humerus fracture. Preoperative planning examination. EXAM: CT OF THE RIGHT SHOULDER WITHOUT CONTRAST TECHNIQUE: Multidetector CT imaging was performed according to the standard protocol. Multiplanar CT image reconstructions were also generated. COMPARISON:  Plain films right shoulder 09/12/2016. PA and lateral chest 05/08/2014. FINDINGS: As seen on the comparison plain films, the patient has a fracture of the surgical neck of the right humerus. The shaft of the humerus demonstrates anterior displacement of approximately 4.2 cm.  The humeral head is rotated with the greater tuberosity directed toward the superior margin of the glenoid. There is fragment override of approximately 2 cm. The fracture extends through the greater tuberosity most notable posteriorly where there is distraction of approximately 0.7 cm and mild comminution. The lesser tuberosity is spared. The humeral head remains located. The acromioclavicular joint is intact and unremarkable. The rotator cuff of and long head of biceps tendon appear intact. The patient has a severe compression fracture deformity of T7. Milder compression fracture of T8 is also identified. These fractures are  remote and seen on the comparison plain films of the chest. Aortic atherosclerosis is identified. Imaged lung parenchyma is clear. IMPRESSION: Acute surgical neck fracture of the humerus with anterior displacement of approximately 4.2 cm and fragment override of approximately 2 cm. The fracture extends into the greater tuberosity where there is mild distraction and comminution. As noted above, the humeral head is rotated due to retraction by the rotator cuff with the greater tuberosity directed toward the glenoid. Remote T7 and T8 compression fractures. Calcific aortic atherosclerosis. Electronically Signed   By: Inge Rise M.D.   On: 09/13/2016 13:16   Dg Hand 2 View Right  Result Date: 09/12/2016 CLINICAL DATA:  82 year old female with a history of fall. Arm pain. EXAM: RIGHT HAND - 2 VIEW COMPARISON:  None. FINDINGS: Osteopenia. Acute nondisplaced fracture at the base of the distal phalanx of the right thumb. Advanced osteoarthritis of the right hand.  No erosive changes. No radiopaque foreign body. IMPRESSION: Acute nondisplaced fracture at the base of the distal phalanx of the right thumb. Osteopenia and advanced osteoarthritis. Signed, Dulcy Fanny. Earleen Newport, DO [facility] Electronically Signed   By: Corrie Mckusick D.O.   On: 09/12/2016 16:52   Dg Chest Port 1 View  Result Date: 09/12/2016 CLINICAL DATA:  Multiple falls.  Humerus fracture. EXAM: PORTABLE CHEST 1 VIEW COMPARISON:  Right humerus 09/22/2016 and chest radiography 05/08/2014 FINDINGS: There is a displaced fracture of the right humeral neck. The distal humeral component is displaced medially. Right AC joint appears to be intact. Both lungs are clear. Negative for pneumothorax. Atherosclerotic calcifications at the aortic arch. Heart size is within normal limits. IMPRESSION: No acute chest abnormality. Displaced fracture of the right humeral neck. Aortic atherosclerosis. Electronically  Signed   By: Markus Daft M.D.   On: 09/12/2016 17:04   Dg Shoulder Right Port  Result Date: 09/19/2016 CLINICAL DATA:  Post right shoulder replacement. EXAM: PORTABLE RIGHT SHOULDER COMPARISON:  09/12/2016 FINDINGS: Right total shoulder arthroplasty was performed. Skin staples are noted. Visualized right lung is clear. Right shoulder arthroplasty appears to be located. IMPRESSION: Right shoulder arthroplasty.  No complicating features. Electronically Signed   By: Markus Daft M.D.   On: 09/19/2016 17:17   Dg Humerus Right  Result Date: 09/12/2016 CLINICAL DATA:  Fall with right arm injury.  Initial encounter. EXAM: RIGHT HUMERUS - 2+ VIEW COMPARISON:  None. FINDINGS: There is a fracture of the right humeral neck with medial displacement of the humeral shaft relative to the humeral head. There also likely is a fracture of the humeral head through the level of the greater tuberosity and the humeral head appears abnormally rotated. IMPRESSION: Displaced fracture of the surgical neck of the proximal right humerus. Electronically Signed   By: Aletta Edouard M.D.   On: 09/12/2016 16:52   Dg Knee 2 Views Right  Result Date: 09/12/2016 CLINICAL DATA:  82 year old female with a  history of dementia and fall. EXAM: RIGHT KNEE - 3 VIEW COMPARISON:  None. FINDINGS: No acute displaced fracture. No focal soft tissue swelling.  No joint effusion. Osteopenia. IMPRESSION: No acute bony abnormality. Signed, Dulcy Fanny. Earleen Newport, DO [facility] Electronically Signed   By: Corrie Mckusick D.O.   On: 09/12/2016 16:53   Disposition: Plan will be for discharge to rehab today with hospice care.  Palliative Care will continue to follow the patient at the facility.  CBC in 5 days.  Follow-up in 10-14 with [facility].  Follow-up Information    Judson Roch, PA-C Follow up in 14 day(s).   Specialty:  Physician Assistant Why:  Electa Sniff  information: [place] [address] [phone]          Signed: Judson Roch PA-C 09/20/2016, 10:29 AM

## 2016-09-20 NOTE — NC FL2 (Signed)
[place] LEVEL OF CARE SCREENING TOOL     IDENTIFICATION  Patient Name: Deborah Espinoza Birthdate: 1933/09/19 Sex: female Admission Date (Current Location): 09/19/2016  [place] and [place] Number:  Engineering geologist and Address:  [facility], [address], [place], [place] [address]      Provider Number: [id number]  Attending Physician Name and Address:  Corky Mull, MD  Relative Name and Phone Number:       Current Level of Care: SNF Recommended Level of Care: [place] Prior Approval Number:    Date Approved/Denied:   PASRR Number:  [id number])  Discharge Plan: SNF    Current Diagnoses: Patient Active Problem List   Diagnosis Date Noted  . Status post reverse total shoulder replacement, right 09/19/2016  . Shoulder fracture 09/13/2016  . Pressure injury of skin 09/13/2016  . Shoulder fracture, right, closed, initial encounter 09/12/2016  . Hyperparathyroidism, primary ([place]) 05/17/2015  . Abnormal TSH 05/08/2014  . Leukocytosis, unspecified 05/08/2014  . Hypokalemia 11/21/2012  . hx: breast cancer, right, DCIS, receptor + 11/28/2011  . Neuropathy ([place]) 09/27/2011  . NECK SPRAIN AND STRAIN 11/06/2010  . VARICOSE VEINS LOWER EXTREMITIES W/OTH COMPS 05/05/2010  . HEMORRHOIDS, EXTERNAL 01/06/2010  . Dementia without behavioral disturbance 07/23/2009  . PERIPHERAL EDEMA 04/03/2009  . Essential hypertension 01/22/2009  . DECREASED HEARING 09/02/2008  . CONSTIPATION, CHRONIC 04/21/2008  . HYPERLIPIDEMIA 08/30/2007  . COLONIC POLYPS, HX OF 08/30/2007  . ALLERGIC RHINITIS 08/06/2007  . LICHEN NOS [id number]  . Osteoporosis 08/06/2007    Orientation RESPIRATION BLADDER Height & Weight     Self  Normal External catheter Weight:   Height:     BEHAVIORAL SYMPTOMS/MOOD NEUROLOGICAL BOWEL NUTRITION STATUS   (None)  (None.) Continent Diet (Diet: Regular )  AMBULATORY STATUS COMMUNICATION OF NEEDS Skin    Extensive Assist Verbally PU Stage and Appropriate Care (Stage 1: Mid Coccyx)                       Personal Care Assistance Level of Assistance  Bathing, Feeding, Dressing Bathing Assistance: Limited assistance Feeding assistance: Independent Dressing Assistance: Limited assistance     Functional Limitations Info  Sight, Hearing, Speech Sight Info: Adequate Hearing Info: Adequate Speech Info: Impaired    SPECIAL CARE FACTORS FREQUENCY  PT (By licensed PT), OT (By licensed OT)     PT Frequency:  (5) OT Frequency:  (5)            Contractures      Additional Factors Info  Code Status, Allergies Code Status Info:  (Full Code) Allergies Info:  ( Cephalexin, Clarithromycin, Codeine, Hydrocodone, Penicillins, Risedronate Sodium)           Current Medications (09/20/2016):  This is the current hospital active medication list Current Facility-Administered Medications  Medication Dose Route Frequency Provider Last Rate Last Dose  . 0.9 %  sodium chloride infusion   Intravenous Continuous Hessie Knows, MD 75 mL/hr at 09/20/16 0000    . acetaminophen (TYLENOL) tablet 650 mg  650 mg Oral Q6H PRN Corky Mull, MD       Or  . acetaminophen (TYLENOL) suppository 650 mg  650 mg Rectal Q6H PRN Corky Mull, MD      . acetaminophen (TYLENOL) tablet 1,000 mg  1,000 mg Oral Q6H Corky Mull, MD   1,000 mg at 09/20/16 0503  . amLODipine (NORVASC) tablet 5 mg  5 mg Oral Daily  Corky Mull, MD      . bisacodyl (DULCOLAX) EC tablet 5 mg  5 mg Oral Daily PRN Corky Mull, MD      . bisacodyl (DULCOLAX) suppository 10 mg  10 mg Rectal Daily PRN Corky Mull, MD      . cholecalciferol (VITAMIN D) tablet 1,000 Units  1,000 Units Oral Daily Corky Mull, MD      . diphenhydrAMINE (BENADRYL) 12.5 MG/5ML elixir 12.5-25 mg  12.5-25 mg Oral Q4H PRN Corky Mull, MD      . docusate sodium (COLACE) capsule 100 mg  100 mg Oral BID Corky Mull, MD   100 mg at 09/19/16 2058  . donepezil  (ARICEPT) tablet 20 mg  20 mg Oral QHS PRN Corky Mull, MD      . enoxaparin (LOVENOX) injection 30 mg  30 mg Subcutaneous Q24H Corky Mull, MD      . HYDROmorphone (DILAUDID) injection 0.5-1 mg  0.5-1 mg Intravenous Q2H PRN Corky Mull, MD      . magnesium hydroxide (MILK OF MAGNESIA) suspension 30 mL  30 mL Oral Daily PRN Corky Mull, MD      . metoCLOPramide (REGLAN) tablet 5-10 mg  5-10 mg Oral Q8H PRN Corky Mull, MD       Or  . metoCLOPramide (REGLAN) injection 5-10 mg  5-10 mg Intravenous Q8H PRN Corky Mull, MD      . multivitamin-lutein (OCUVITE-LUTEIN) capsule   Oral Daily Corky Mull, MD      . ondansetron [facility]) tablet 4 mg  4 mg Oral Q6H PRN Corky Mull, MD       Or  . ondansetron [facility]) injection 4 mg  4 mg Intravenous Q6H PRN Corky Mull, MD      . oxyCODONE (Oxy IR/ROXICODONE) immediate release tablet 5-10 mg  5-10 mg Oral Q3H PRN Corky Mull, MD   5 mg at 09/20/16 0000  . pantoprazole (PROTONIX) EC tablet 40 mg  40 mg Oral Daily Corky Mull, MD      . sodium phosphate (FLEET) 7-19 GM/118ML enema 1 enema  1 enema Rectal Once PRN Corky Mull, MD      . traMADol Veatrice Bourbon) tablet 50 mg  50 mg Oral Q6H PRN Corky Mull, MD   50 mg at 09/19/16 2043     Discharge Medications: Please see discharge summary for a list of discharge medications.  Relevant Imaging Results:  Relevant Lab Results:   Additional Information  (SSN: [id number])  Danie Chandler, Student-Social Work

## 2016-09-20 NOTE — Clinical Social Work Note (Signed)
Clinical Social Work Assessment  Patient Details  Name: Deborah Espinoza MRN: [id number] Date of Birth: [birth year]  Date of referral:  09/20/16               Reason for consult:  Facility Placement, Discharge Planning                Permission sought to share information with:  Chartered certified accountant granted to share information::  Yes, Verbal Permission Granted  Name::      [place]::   [place]   Relationship::     Contact Information:     Housing/Transportation Living arrangements for the past 2 months:  Single Family Home Source of Information:  Spouse Patient Interpreter Needed:  None Criminal Activity/Legal Involvement Pertinent to Current Situation/Hospitalization:  No - Comment as needed Significant Relationships:  Adult Children, Spouse Lives with:  Spouse Do you feel safe going back to the place where you live?  Yes Need for family participation in patient care:  Yes (Comment)  Care giving concerns:  Patient lives at home with her husband, Deborah Espinoza in [place].    Social Worker assessment / plan:  Holiday representative (CSW) received SNF consult. CSW is familiar with patient from previous admission. Patient was discharged from [facility] on Thursday 09/15/16 and went to [place] under private pay respite care. Patient was brought back into [facility] on Monday 09/19/16 for surgery. PT has not worked with patient at this time. Patient is post-op day one. Social work Theatre manager attempted to do an assessment on patient, but patient could not respond to questions. Social work Theatre manager called patient's husband Deborah Espinoza to complete assessment. Social work Theatre manager notified patient's husband that PT and OT will work with patient today. Social work Theatre manager explained that [employer] authorization will be pursued. Social work Theatre manager explained to patient's husband that if patient is unable to work with PT and OT like on last admission due to cognitive  impairment, patient will have to go back to [facility] for rehab under private pay. Patient's husband thinks that it is too soon for patient to work with PT and OT. CSW explained to patient's husband that PT and OT always work with patinet's post-op day one and discussed the benefits of getting up after surgery. Patient's husband verbally agreed he understood. Per husband he can try to continue to pay privately for [facility] however he reported that he does not have a lot of money. CSW emphasized that if [facility] does not approve SNF then he will have to continue to pay out of pocket for [facility]. [place] admissions coordinator at [facility] is aware of above.   Fl2 completed and faxed out.   Employment status:  Retired Nurse, adult PT Recommendations:  [place] / Referral to community resources:  [place]  Patient/Family's Response to care:  Patient's husband was agreeable for patient to go back to [facility] under private pay if authorization is not received for [employer].    Patient/Family's Understanding of and Emotional Response to Diagnosis, Current Treatment, and Prognosis:  Patient's husband thinks it to to soon for patient to work with PT and OT. CSW provided education and emotional support. Patient thanked social work Theatre manager and [place] for calling.   Emotional Assessment Appearance:  Appears stated age Attitude/Demeanor/Rapport:  Unable to Assess Affect (typically observed):  Unable to Assess Orientation:  Oriented to Self Alcohol / Substance  use:  Not Applicable Psych involvement (Current and /or in the community):  No (Comment)  Discharge Needs  Concerns to be addressed:  Basic Needs, Discharge Planning Concerns Readmission within the last 30 days:  Yes Current discharge risk:  Dependent with Mobility Barriers to Discharge:  Continued Medical Work up   [employer], [place]  Work 09/20/2016, 9:03 AM

## 2016-09-20 NOTE — Progress Notes (Signed)
Lab stating that new sample of blood is not usable. They will come and redraw.

## 2016-09-20 NOTE — Plan of Care (Signed)
Problem: Safety: Goal: Ability to remain free from injury will improve Outcome: Progressing Pt very confused. Bed alarm activated.  Problem: Pain Managment: Goal: General experience of comfort will improve Outcome: Progressing Pt pain control with oral pain medication. Tolerating without difficulty.  Problem: Skin Integrity: Goal: Risk for impaired skin integrity will decrease Outcome: Progressing Pt turned Q2h. Heels evelvated off bed.

## 2016-09-20 NOTE — Progress Notes (Signed)
Shift assessment completed at 0810. Pt awake at that time, responded to questions with one word answers. Pt is on room air, respirations are unlabored, lungs clear bilat. Monitor in place, Sr noted. Abdomen is soft, bs heard. Pt has foley intacvt and draining clear yellow urine, thigh high teds on bilat, foot pumps in place, ppp. PIV #20 intact to l fa with iv ns infusing at 71mls/hr,site is free of redness and swelling. Pt has multiple areas of bruising, pt has bruising to her R back, bilat knees, R shin, l fa. Small dressing noted to R shin. Pt has honeycomb dressing intact to R shoulder with polarcare in place and immobilizer on. R hand is swollen, bruised. Since assessment, pt has been fed breakfast and lunch by staff. Pt has received regular pain medication, and physical therapy attempted to evaluate pt, pt unable to get oob, and pt was orally suctioned after this as she coughed up some phlegm and held it in her mouth, pt had to have her mouth gently pried open to accomplish this. At this time, pt's husband has arrived and is sitting at bedside, this Probation officer updated him. Pt in no distress, grimaces with movement.

## 2016-09-20 NOTE — Plan of Care (Signed)
Problem: Bowel/Gastric: Goal: Will not experience complications related to bowel motility Outcome: Progressing Pt is making minimal progress toward goals, hampered by surgery, her inability to follow commands at thsi time, and her dementia.

## 2016-09-20 NOTE — Evaluation (Addendum)
Occupational Therapy Evaluation Patient Details Name: Deborah Espinoza MRN: [id number] DOB: 17-Jul-1933 Today's Date: 09/20/2016    History of Present Illness Pt. is an 82 y.o. female who was admitted to [facility] with a Right Proximal Humerus Fracture. Pt. underwent a Reverse Total Shoulder Replacement. Pt. has a history of Dementia.   Clinical Impression   Pt. Is an 82 y.o. Female who was admitted to [facility] with a Right Proximal Huumerus Fracture. Pt. Underwent a Reverse Total Shoulder Replacement. Pt. Presents with impaired cognition, pain, weakness, RUE immobilization, and NWB, and impaired functional mobility which hinder pt. ADL functioning. Pt. could benefit from skilled OT services for ADL training, functional mobility, and pt./family education. Pt. could benefit from SNF level of care.    Follow Up Recommendations    SNF   Equipment Recommendations       Recommendations for Other Services       Precautions / Restrictions Precautions Precautions: Fall Restrictions Weight Bearing Restrictions: No RLE Weight Bearing: Non weight bearing                                                     ADL Overall ADL's : Needs assistance/impaired Eating/Feeding: Total assistance   Grooming: Total assistance           Upper Body Dressing : Total assistance       Toilet Transfer: Total assistance   Toileting- Clothing Manipulation and Hygiene: Total assistance         General ADL Comments: Pt. presents with poor command following/following directions for one step light ADL tasks      Vision     Perception     Praxis      Pertinent Vitals/Pain  Positive pain, unable to rate during session. Nursing in to assess pt. pain.     Hand Dominance Right   Extremity/Trunk Assessment Upper Extremity Assessment Upper Extremity Assessment: RUE Immobilized.             Communication Communication Communication: No difficulties   Cognition  Arousal/Alertness: Awake/alert   Overall Cognitive Status: History of cognitive impairments - at baseline (Difficulty following directions for functional tasks. Able to verballize one word responses.)                     General Comments       Exercises       Shoulder Instructions      Home Living Family/patient expects to be discharged to:: Skilled nursing facility Living Arrangements: Spouse/significant other Available Help at Discharge:                                    Prior Functioning/Environment   Family not available to provided PLOF/History information.                OT Problem List:     OT Treatment/Interventions:      OT Goals(Current goals can be found in the care plan section)    OT Frequency:     Barriers to D/C:            Co-evaluation              End of Session    Activity Tolerance:  Poor Patient left:  In  bed, with alarm set, call bell in place, and nursing present.   Time: [id number] OT Time Calculation (min): 15 min Charges:  OT General Charges $OT Visit: 1 Procedure OT Evaluation $OT Eval Low Complexity: 1 Procedure G-Codes:    Harrel Carina, MS, OTR/L 09/20/2016, 11:25 AM

## 2016-09-20 NOTE — Evaluation (Signed)
Physical Therapy Evaluation Patient Details Name: Deborah Espinoza MRN: [id number] DOB: 1933/07/05 Today's Date: 09/20/2016   History of Present Illness  Pt is an 82 y.o. female who was recently admitted to [facility] with a Right Proximal Humerus Fracture and distal phalanx R thumb nondisplaced fx (09/12/16).  Pt then discharged to [facility] for respite care until current admission for surgery. Pt underwent a Reverse Total Shoulder Replacement 09/19/16. Pt has a history of Dementia, htn, and HLD.  Clinical Impression  Pt was able to verbalize occasionally simple 1 word answers and occasionally answer questions with a few words; pt also was able to follow a few commands during session (squeeze hand with L hand; lift head to place pillow; shift weight for sitting balance on edge of bed).  Pt required total assist with bed mobility d/t difficulty following commands/sequencing and was initially max assist sitting edge of bed d/t posterior lean but with cueing able to progress to min assist sitting balance.  Attempted to stand pt but pt appeared to become fearful, leaning backwards and starting to slide off edge of bed, so pt was assisted safely back into supine in bed.  Pt would benefit from skilled PT to address noted impairments and functional limitations.  Will trial pt with PT (pt appears with some improved ability to follow commands since last hospital admission) and will attempt to progress pt with functional mobility per pt tolerance.  Recommend pt discharge to STR (pending pt progress/participation with therapy) when medically appropriate.    Follow Up Recommendations SNF (pending pt progress/participation with therapy (trial PT))    Equipment Recommendations   (TBD)    Recommendations for Other Services       Precautions / Restrictions Precautions Precautions: Fall;Shoulder Shoulder Interventions: Shoulder sling/immobilizer;Shoulder abduction pillow;At all times Required Braces or Orthoses:  Sling Restrictions Weight Bearing Restrictions: Yes RLE Weight Bearing: Non weight bearing      Mobility  Bed Mobility Overal bed mobility: Needs Assistance;+2 for physical assistance;+ 2 for safety/equipment Bed Mobility: Supine to Sit;Sit to Supine     Supine to sit: Total assist;+2 for physical assistance Sit to supine: Total assist;+2 for physical assistance   General bed mobility comments: Pt with difficulty with following commands for sequencing requiring total assist of 2 for safety.  Transfers                 General transfer comment: Deferred d/t pt appearing scared and leaning backwards when attempting to stand  Ambulation/Gait             General Gait Details: not appropriate at this time  Stairs            Wheelchair Mobility    Modified Rankin (Stroke Patients Only)       Balance Overall balance assessment: Needs assistance;History of Falls Sitting-balance support: Single extremity supported;Feet supported Sitting balance-Leahy Scale: Poor Sitting balance - Comments: initially max to total assist but with cueing improved to min assist Postural control: Right lateral lean                                   Pertinent Vitals/Pain Pain Assessment: Faces Faces Pain Scale: Hurts little more Pain Location: R shoulder; guarded with some movement Pain Descriptors / Indicators: Grimacing;Operative site guarding Pain Intervention(s): Limited activity within patient's tolerance;Monitored during session;Premedicated before session;Repositioned;Ice applied    Home Living Family/patient expects to be discharged to:: Skilled nursing  facility Living Arrangements: Spouse/significant other Available Help at Discharge: Family             Additional Comments: Unsure as pt poor historian and limited communication during session. No family in room.    Prior Function           Comments: TBD, pt unable to provide history during  session.  Recent discharge from hospital to [facility] for respite care (private care) until surgery.     Hand Dominance   Dominant Hand: Right    Extremity/Trunk Assessment   Upper Extremity Assessment: RUE deficits/detail;LUE deficits/detail;Difficult to assess due to impaired cognition (R hand swelling noted (elevated on pillow end of session to decrease swelling))   RUE: Unable to fully assess due to immobilization   LUE Deficits / Details: generalized weakness   Lower Extremity Assessment: Generalized weakness;Difficult to assess due to impaired cognition (pt actively moving LE's on own in bed)         Communication   Communication:  (Minimal verbalization during session.)  Cognition Arousal/Alertness: Awake/alert Behavior During Therapy: Anxious Overall Cognitive Status: No family/caregiver present to determine baseline cognitive functioning       Memory: Decreased recall of precautions              General Comments General comments (skin integrity, edema, etc.): Pt sleeping in bed upon PT arrival.  Sling and polar care in place.  Nursing cleared pt for participation in physical therapy.  Pt agreeable to PT session.  Nursing called to suction pt during session d/t pt coughing up mucous but not spitting it out or swallowing it.    Exercises     Assessment/Plan    PT Assessment Patient needs continued PT services  PT Problem List Decreased strength;Decreased activity tolerance;Decreased balance;Decreased mobility;Decreased knowledge of use of DME;Decreased safety awareness;Decreased knowledge of precautions;Pain          PT Treatment Interventions DME instruction;Gait training;Functional mobility training;Therapeutic activities;Therapeutic exercise;Balance training;Patient/family education    PT Goals (Current goals can be found in the Care Plan section)  Acute Rehab PT Goals Patient Stated Goal: unable to state PT Goal Formulation: Patient unable to  participate in goal setting    Frequency 7X/week   Barriers to discharge Decreased caregiver support      Co-evaluation               End of Session Equipment Utilized During Treatment: Gait belt;Other (comment) (R should immobilizer; polar care) Activity Tolerance: Patient tolerated treatment well Patient left: in bed;with call bell/phone within reach;with bed alarm set;with SCD's reapplied (B heels elevated via towel rolls; polar care in place and activated) Nurse Communication: Mobility status;Precautions;Weight bearing status         Time: 1110-1145 PT Time Calculation (min) (ACUTE ONLY): 35 min   Charges:   PT Evaluation $PT Eval Low Complexity: 1 Procedure PT Treatments $Therapeutic Activity: 8-22 mins   PT G CodesLeitha Bleak 2016-10-12, 12:06 PM Leitha Bleak, [place]

## 2016-09-20 NOTE — Progress Notes (Addendum)
Clinical Education officer, museum (CSW) started [employer].   4:23 pm [employer] is still pending at this time.   [employer], LCSW [phone]

## 2016-09-20 NOTE — Progress Notes (Signed)
Initial Nutrition Assessment  DOCUMENTATION CODES:   Severe malnutrition in context of chronic illness  INTERVENTION:  1. Ensure Enlive po BID, each supplement provides 350 kcal and 20 grams of protein  NUTRITION DIAGNOSIS:   Malnutrition related to chronic illness as evidenced by severe depletion of muscle mass, severe depletion of body fat.  GOAL:   Patient will meet greater than or equal to 90% of their needs  MONITOR:   PO intake, I & O's, Labs, Weight trends, Supplement acceptance  REASON FOR ASSESSMENT:   Malnutrition Screening Tool    ASSESSMENT:   Deborah Espinoza is a 82 yo female who suffered a fall and is now s/p reverse right total shoulder arthoplasty  Deborah Espinoza is well known to RD, she did not speak to me today, no family at bedside. No new wt with this admission. PO intake 50% yesterday documented. D/C today Nutrition-Focused physical exam completed. Findings are severe fat depletion, severe muscle depletion, and moderate r extremity edema.    Labs and medications reviewed: Vitamin D, Colace NS @ 39mL/hr  Diet Order:  DIET SOFT Room service appropriate? Yes; Fluid consistency: Thin  Skin:  Wound (see comment) (Stg I to coccyx)  Last BM:  PTA  Height:   Ht Readings from Last 1 Encounters:  09/12/16 5' (1.524 m)    Weight:   Wt Readings from Last 1 Encounters:  09/12/16 95 lb 11.2 oz (43.4 kg)    Ideal Body Weight:  45.45 kg  BMI:  There is no height or weight on file to calculate BMI.  Estimated Nutritional Needs:   Kcal:  1080 - 1300 calories  Protein:  52-64 gm  Fluid:  >/= 1.1L  EDUCATION NEEDS:   No education needs identified at this time  Deborah Anis. [name], MS, RD LDN Inpatient Clinical Dietitian Pager [phone]

## 2016-09-21 ENCOUNTER — Inpatient Hospital Stay: Payer: Medicare Other

## 2016-09-21 LAB — URINALYSIS COMPLETE WITH MICROSCOPIC (ARMC ONLY)
BACTERIA UA: NONE SEEN
BILIRUBIN URINE: NEGATIVE
GLUCOSE, UA: NEGATIVE mg/dL
Ketones, ur: NEGATIVE mg/dL
Leukocytes, UA: NEGATIVE
NITRITE: NEGATIVE
Protein, ur: NEGATIVE mg/dL
SPECIFIC GRAVITY, URINE: 1.012 (ref 1.005–1.030)
Squamous Epithelial / LPF: NONE SEEN
pH: 6 (ref 5.0–8.0)

## 2016-09-21 LAB — MAGNESIUM: Magnesium: 2 mg/dL (ref 1.7–2.4)

## 2016-09-21 LAB — BASIC METABOLIC PANEL
Anion gap: 5 (ref 5–15)
BUN: 19 mg/dL (ref 6–20)
CHLORIDE: 112 mmol/L — AB (ref 101–111)
CO2: 27 mmol/L (ref 22–32)
Calcium: 10.7 mg/dL — ABNORMAL HIGH (ref 8.9–10.3)
Creatinine, Ser: 1.12 mg/dL — ABNORMAL HIGH (ref 0.44–1.00)
GFR calc non Af Amer: 45 mL/min — ABNORMAL LOW (ref >60)
GFR, EST AFRICAN AMERICAN: 52 mL/min — AB (ref >60)
Glucose, Bld: 107 mg/dL — ABNORMAL HIGH (ref 65–99)
POTASSIUM: 4.1 mmol/L (ref 3.5–5.1)
Sodium: 144 mmol/L (ref 135–145)

## 2016-09-21 LAB — LACTIC ACID, PLASMA
Lactic Acid, Venous: 0.9 mmol/L (ref 0.5–1.9)
Lactic Acid, Venous: 1.8 mmol/L (ref 0.5–1.9)

## 2016-09-21 LAB — CBC
HEMATOCRIT: 36.2 % (ref 35.0–47.0)
Hemoglobin: 11.9 g/dL — ABNORMAL LOW (ref 12.0–16.0)
MCH: 30.9 pg (ref 26.0–34.0)
MCHC: 32.8 g/dL (ref 32.0–36.0)
MCV: 94.3 fL (ref 80.0–100.0)
Platelets: 257 10*3/uL (ref 150–440)
RBC: 3.84 MIL/uL (ref 3.80–5.20)
RDW: 14.5 % (ref 11.5–14.5)
WBC: 17 10*3/uL — AB (ref 3.6–11.0)

## 2016-09-21 LAB — TROPONIN I
TROPONIN I: 0.03 ng/mL — AB (ref <0.03)
TROPONIN I: 0.03 ng/mL — AB (ref <0.03)

## 2016-09-21 LAB — GLUCOSE, CAPILLARY: Glucose-Capillary: 115 mg/dL — ABNORMAL HIGH (ref 65–99)

## 2016-09-21 MED ORDER — METOPROLOL TARTRATE 5 MG/5ML IV SOLN: 5.0000 mg | Freq: Once | INTRAVENOUS | Status: DC

## 2016-09-21 MED ORDER — LEVOFLOXACIN IN D5W 750 MG/150ML IV SOLN
750.0000 mg | INTRAVENOUS | Status: DC
Start: 2016-09-21 — End: 2016-09-22
  Administered 2016-09-21: 750 mg via INTRAVENOUS
  Filled 2016-09-21: qty 150

## 2016-09-21 NOTE — Progress Notes (Signed)
Deborah Espinoza responded to a request by the nurse to visit with Pt. Pt was non-responsive. Husband and clergy were bedside. Husband was overwhelmed by all the decisions he was having to make. (pr nurse) [place] yielded spiritual care to the pastor. [place] informed husband that [facility] services are available 24/7. [facility] is available for follow up as needed.    09/21/16 1600  Clinical Encounter Type  Visited With Patient and family together;Patient not available;Other (Comment) (Clergy present )  Visit Type Initial;Spiritual support;Post-op  Referral From Nurse  Consult/Referral To Chaplain

## 2016-09-21 NOTE — Progress Notes (Signed)
Clinical Social Worker (CSW) received call yesterday 09/20/16 after 5 pm from [facility] case manager. Per [employer] the case will be sent for review to the medical director who will do a peer to peer with Dr. Roland Rack or PA [place]. CSW paged Dr. Roland Rack on yesterday and made him aware of above. CSW attempted to page [place] PA however he never called CSW back. CSW contacted patient's husband this morning and made him aware of above. CSW explained to husband that [facility] will likely not approve and he will have to continue to pay privately for [facility]. CSW left patient's son Annye Asa a Advertising account executive. CSW will continue to follow and assist as as needed.   [employer], LCSW [phone]

## 2016-09-21 NOTE — Progress Notes (Addendum)
Notified Dr. Vianne Bulls of critical troponin of 0.03 called by the lab. Acknowledge but no new orders

## 2016-09-21 NOTE — Progress Notes (Signed)
Rapid response called on pt. D/t  Tachycardia and shaking. Dr. Marry Guan notified and ordered stat medical consult.

## 2016-09-21 NOTE — Progress Notes (Signed)
Subjective: 2 Days Post-Op Procedure(s) (LRB): REVERSE RIGHT TOTAL SHOULDER ARTHROPLASTY (Right) Patient reports pain as mild.   Patient is well, but has had some minor complaints of fever Plan is to go Skilled nursing facility after hospital stay. Negative for chest pain and shortness of breath Fever: 99 temp this AM. Gastrointestinal:Negative for nausea and vomiting  Objective: Vital signs in last 24 hours: Temp:  [98.3 F (36.8 C)-99.9 F (37.7 C)] 99 F (37.2 C) (11/15 0504) Pulse Rate:  [66-131] 107 (11/15 0504) Resp:  [18-19] 19 (11/15 0504) BP: (150-151)/(54-91) 150/82 (11/15 0504) SpO2:  [96 %-100 %] 100 % (11/15 0504)  Intake/Output from previous day:  Intake/Output Summary (Last 24 hours) at 09/21/16 0721 Last data filed at 09/21/16 0531  Gross per 24 hour  Intake          1581.25 ml  Output             1100 ml  Net           481.25 ml    Intake/Output this shift: No intake/output data recorded.  Labs:  Recent Labs  09/19/16 1203 09/20/16 0633 09/21/16 0129  HGB 15.3 12.0 11.9*    Recent Labs  09/20/16 0633 09/21/16 0129  WBC 17.1* 17.0*  RBC 3.82 3.84  HCT 36.4 36.2  PLT 222 257    Recent Labs  09/20/16 0633 09/21/16 0129  NA 143 144  K 4.6 4.1  CL 111 112*  CO2 27 27  BUN 26* 19  CREATININE 1.13* 1.12*  GLUCOSE 111* 107*  CALCIUM 10.8* 10.7*    Recent Labs  09/19/16 1203  INR 0.89     EXAM General - Patient is Alert however for the majority of the exam non-verbal, according to medical staff this is her baseline. Extremity - ABD soft Sensation intact distally Incision: scant drainage No cellulitis present Dressing/Incision - blood tinged drainage at the distal aspect of the incision. Motor Function - intact, moving foot and toes well on exam.  Patient is intact to light touch in the distribution of the right axillary nerve.  Past Medical History:  Diagnosis Date  . Allergic rhinitis, cause unspecified   . Breast cancer  ([place])    right  . Cataracts, bilateral    complications from eye surgery  . Dizziness and giddiness   . Edema   . Elevated blood pressure reading without diagnosis of hypertension   . External hemorrhoids without mention of complication   . GERD (gastroesophageal reflux disease)   . Lichen, unspecified    sclerosis  . Memory loss   . Mixed hyperlipidemia   . Osteoporosis, unspecified   . Other constipation   . Personal history of colonic polyps   . Sprain of neck   . Unspecified constipation   . Unspecified essential hypertension   . Unspecified hearing loss   . Urticaria, unspecified   . Varicose veins of lower extremities with other complications     Assessment/Plan: 2 Days Post-Op Procedure(s) (LRB): REVERSE RIGHT TOTAL SHOULDER ARTHROPLASTY (Right) Active Problems:   Status post reverse total shoulder replacement, right  Estimated body mass index is 18.69 kg/m as calculated from the following:   Height as of 09/12/16: 5' (1.524 m).   Weight as of 09/12/16: 43.4 kg (95 lb 11.2 oz). Up with therapy   Labs reviewed this AM, WBC still 17.0, patient began running temperature and had "seizure" which prompted a referral to internal medicine.  Internal medicine has order blood cultures for  the patient. Pt coughing this AM, will add chest x-ray this AM. Acute renal failure, BUN 19 and Cr 1.12. Hg stable this AM. Will monitor results today from labs and chest x-ray.  DVT Prophylaxis - Lovenox, Foot Pumps and TED hose Non-weightbearing to the right upper extremity.  Raquel James, PA-C [facility] Orthopaedic Surgery 09/21/2016, 7:21 AM

## 2016-09-21 NOTE — Progress Notes (Signed)
Deborah Espinoza made a follow-up with the Deborah Espinoza that the on-call Deborah Espinoza had recommended to be visited. Deborah Espinoza visited the Deborah Espinoza, but Deborah Espinoza was asleep, so he never talked to Deborah Espinoza. Deborah Espinoza plans to follow-up with Deborah Espinoza at another time.   09/21/16 1500  Clinical Encounter Type  Visited With Patient  Visit Type Initial;Follow-up  Referral From Chaplain;Nurse  Consult/Referral To Chaplain  Spiritual Encounters  Spiritual Needs Prayer

## 2016-09-21 NOTE — Consult Note (Signed)
[facility] - [place] @ [facility] STAT Medical Consult [place], D.O.  ---------------------------------------------------------------------------------------------------------------------   PATIENT NAME: Deborah Espinoza MR#: [id number] DATE OF BIRTH: [birth year] DATE OF ADMISSION: 09/19/2016 PRIMARY CARE PHYSICIAN: Roxy Manns, MD  REQUESTING/REFERRING PHYSICIAN: ED Dr. Ernest Pine  CHIEF COMPLAINT: Tachycardia  HISTORY OF PRESENT ILLNESS: Please note that the entire history is obtained from orthopedic surgeon, nursing and patient's chart as patient is unable to provide history secondary to dementia. Deborah Espinoza is a 82 y.o. female with a known history of dementia, hypertension, hyperlipidemia who had a reverse right shoulder arthroplasty to treat a displaced right proximal humerus fracture on 09/19/2016. Medicine was contacted urgently by Dr. Ernest Pine following a rapid response for shaking and tachycardia. Per nursing the patient's baseline is calm and cooperative and pleasantly confused. She was noticed to have sinus tachycardia on the heart monitor and when nursing went to check on her she was found to be having rigors, shaking and trembling in bed. She was awake and alert with no alteration of mental status. She received Dilaudid for pain and her symptoms subsequently resolved without any additional intervention. Her vital signs have since returned to normal with the exception of a temperature to 99.9.  Otherwise there has been no change in status.   Patient herself gives no history. She is arousable but not communicative.  PAST MEDICAL HISTORY: Past Medical History:  Diagnosis Date  . Allergic rhinitis, cause unspecified   . Breast cancer (HCC)    right  . Cataracts, bilateral    complications from eye surgery  . Dizziness and giddiness   . Edema   . Elevated blood pressure reading without diagnosis of hypertension   . External hemorrhoids without mention of complication    . GERD (gastroesophageal reflux disease)   . Lichen, unspecified    sclerosis  . Memory loss   . Mixed hyperlipidemia   . Osteoporosis, unspecified   . Other constipation   . Personal history of colonic polyps   . Sprain of neck   . Unspecified constipation   . Unspecified essential hypertension   . Unspecified hearing loss   . Urticaria, unspecified   . Varicose veins of lower extremities with other complications       PAST SURGICAL HISTORY: Past Surgical History:  Procedure Laterality Date  . BILATERAL OOPHORECTOMY     bilateral  . BREAST BIOPSY  12/07   pos ductal carcinoma in situ  . BREAST LUMPECTOMY  2008   RIGHT  . CATARACT EXTRACTION  04/2003  . COLONOSCOPY  11/02   polyp  . COLONOSCOPY  09/2004   polyps  . COLONOSCOPY  11/08   adenomatous polyps  . DEXA  2001   Osteopenia  . DEXA     improved OP  . DEXA  11/2004   OP  . DEXA  2/09   OP T score-3.0 spine and -2.5 in fem neck  . nuclear stress  05/2007   negative  . ovarian tumor  11/04   benign  . PARTIAL HYSTERECTOMY  1987   fibroids  . REVERSE SHOULDER ARTHROPLASTY Right 09/19/2016   Procedure: REVERSE RIGHT TOTAL SHOULDER ARTHROPLASTY;  Surgeon: Christena Flake, MD;  Location: [facility] ORS;  Service: Orthopedics;  Laterality: Right;  . stress perfusion cardiac study  7/22008   Normal with normal EF      SOCIAL HISTORY: Social History  Substance Use Topics  . Smoking status: Never Smoker  . Smokeless tobacco: Never Used  . Alcohol use No  FAMILY HISTORY: Family History  Problem Relation Age of Onset  . Brain cancer Father   . Diabetes Father   . Kidney cancer Mother     renal cell  . Cancer Mother   . Diabetes Brother   . Diabetes Brother   . Colon cancer Brother   . Alzheimer's disease      Aunt     MEDICATIONS AT HOME: Prior to Admission medications   Medication Sig Start Date End Date Taking? Authorizing Provider  acetaminophen (TYLENOL) 325 MG tablet Take 2 tablets (650 mg  total) by mouth every 6 (six) hours as needed for mild pain (or Fever >/= 101). 09/15/16  Yes Shaune Pollack, MD  amLODipine (NORVASC) 5 MG tablet Take 1 tablet (5 mg total) by mouth daily. 08/08/16  Yes Judy Pimple, MD  bisacodyl (DULCOLAX) 5 MG EC tablet Take 1 tablet (5 mg total) by mouth daily as needed for moderate constipation. 09/15/16  Yes Shaune Pollack, MD  Cholecalciferol (VITAMIN D PO) Take 1 tablet by mouth daily.   Yes Historical Provider, MD  donepezil (ARICEPT) 10 MG tablet Take 2 tablets (20 mg total) by mouth at bedtime as needed. 08/08/16  Yes Judy Pimple, MD  Multiple Vitamins-Minerals (PRESERVISION AREDS PO) Take 1 tablet by mouth daily.   Yes Historical Provider, MD  oxyCODONE (OXY IR/ROXICODONE) 5 MG immediate release tablet Take 1-2 tablets (5-10 mg total) by mouth every 4 (four) hours as needed for breakthrough pain. 09/20/16   Anson Oregon, PA-C  traMADol (ULTRAM) 50 MG tablet Take 1 tablet (50 mg total) by mouth every 6 (six) hours as needed for moderate pain. 09/20/16   Anson Oregon, PA-C      DRUG ALLERGIES: Allergies  Allergen Reactions  . Cephalexin   . Clarithromycin   . Codeine   . Hydrocodone   . Penicillins   . Risedronate Sodium      REVIEW OF SYSTEMS: Unable to obtain secondary to patient's dementia.  PHYSICAL EXAMINATION: VITAL SIGNS: Blood pressure (!) 150/70, pulse (!) 131, temperature 99.9 F (37.7 C), temperature source Oral, resp. rate 19, SpO2 96 %.  GENERAL: 82 y.o.-year-old thin pale white female patient, well-developed, well-nourished lying in the bed in no acute distress.  Arousable but not conversive. HEENT: Head atraumatic, normocephalic. Pupils equal, round, reactive to light and accommodation. No scleral icterus. Extraocular muscles intact. Oropharynx is clear. Mucus membranes moist. NECK: Supple. No JVD, no bruit heard. No cervical lymphadenopathy. CHEST: Normal breath sounds bilaterally. No wheezing, rales, rhonchi or crackles.  No use of accessory muscles of respiration.  No reproducible chest wall tenderness.  CARDIOVASCULAR: S1, S2 normal. No murmurs, rubs, or gallops appreciated. Cap refill <2 seconds. ABDOMEN: Soft, nontender, nondistended. No rebound, guarding, rigidity. Normoactive bowel sounds present in all four quadrants. No organomegaly or mass. EXTREMITIES: Right shoulder in orthopedics laying. No pedal edema, cyanosis, or clubbing. NEUROLOGIC: Patient is awake but cannot comply with exam. SKIN: Warm, dry, and intact without obvious rash, lesion, or ulcer.  LABORATORY PANEL:  CBC  Recent Labs Lab 09/20/16 0633  WBC 17.1*  HGB 12.0  HCT 36.4  PLT 222   ----------------------------------------------------------------------------------------------------------------- Chemistries  Recent Labs Lab 09/20/16 0633  NA 143  K 4.6  CL 111  CO2 27  GLUCOSE 111*  BUN 26*  CREATININE 1.13*  CALCIUM 10.8*   ------------------------------------------------------------------------------------------------------------------ Cardiac Enzymes No results for input(s): TROPONINI in the last 168 hours. ------------------------------------------------------------------------------------------------------------------  RADIOLOGY: Dg Shoulder Right Port  Result Date: 09/19/2016  CLINICAL DATA:  Post right shoulder replacement. EXAM: PORTABLE RIGHT SHOULDER COMPARISON:  09/12/2016 FINDINGS: Right total shoulder arthroplasty was performed. Skin staples are noted. Visualized right lung is clear. Right shoulder arthroplasty appears to be located. IMPRESSION: Right shoulder arthroplasty.  No complicating features. Electronically Signed   By: Richarda Overlie M.D.   On: 09/19/2016 17:17    EKG: Pending   IMPRESSION AND PLAN:  This is a 82 y.o. female with a history of dementia, hypertension, hyperlipidemia now postop day #2 status post right reverse shoulder arthroplasty for a displaced right humeral fracture.    Patient symptoms during this rapid response do not sound consistent with seizure activity but are more likely rigors especially given her temperature being elevated at 99.9 and today's white blood cell count being 17.1. I have asked the nurses to draw blood cultures and check a urinalysis to search for signs of infection. I suspect that her tachycardia was physiologic reaction to her fever and rigors however I have asked for an EKG. Her heart rate has normalized without any pharmacologic intervention. We'll continue to monitor her on telemetry and nurses will inform us of any recurrent episodes.  Medicine will be happy to comanage along the surgical team.   TOTAL TIME TAKING CARE OF THIS PATIENT: 60 minutes.   Nguyet Mercer D.O. on 09/21/2016 at 1:24 AM Between 7am to 6pm - Pager - [phone] After 6pm go to [email or web address] - Social research officer, government [facility] [place] [facility] Office [phone] CC: Primary care physician; Roxy Manns, MD     Note: This dictation was prepared with Dragon dictation along with smaller phrase technology. Any transcriptional errors that result from this process are unintentional.

## 2016-09-21 NOTE — Progress Notes (Signed)
New referral for Palliative services at [facility] following discharge received from [place]. [facility] referral made aware.Thank you.  Flo Shanks RN, BSN, [place] [facility], [facility] [phone] c

## 2016-09-21 NOTE — Significant Event (Signed)
Rapid Response Event Note  Overview: Time Called: 2356 Arrival Time: 2358 Event Type: Cardiac  Initial Focused Assessment: Patient alert and confused (baseline) with moderate shaking in the bed. Pt is warm to touch and mildly febrile (99.9). Sinus tach 130s-140s on cardiac monitor. BP 150/70 manually taken on left arm. O2 sats 96% on room air and does not appear to be in any respiratory distress.  Bedside RN states that patient had same kind of episode during previous admission (shaking in bed) however this time, the HR was elevated and sustained. I&O adequate. HR slowly trending downward. Pain medication had been given 10 mins prior to RR.  Interventions:  Dr. Marry Guan paged twice with recommendations for orders to draw BMP and Mg levels. Also with recommendations to give PO metoprolol once for HR and BP. Charge RN to ask for medical consult for management.  Plan of Care (if not transferred): Bedside RN and Charge RN awaiting call back from MD for orders.  ICU Charge RN gave phone number to call for additional support.  Patient stable at time time.  **Follow-up with Charge RN at 0345: Dr. Marry Guan placed internal medical consult and Dr. Almyra Free went to see patient.  Patient's HR had stabilized back to 90s by that time.  Event Summary: Name of Physician Notified: Dr. Marry Guan at [place] in room and stabalized   Event end time:  0025  Zettie Pho

## 2016-09-21 NOTE — Progress Notes (Signed)
SLP Cancellation Note  Patient Details Name: Deborah Espinoza MRN: [id number] DOB: 02/20/1933   Cancelled treatment:       Reason Eval/Treat Not Completed: Fatigue/lethargy limiting ability to participate;Patient's level of consciousness (pt did not alert during lab draw; lethargic during attempts)  Reviewed chart notes; consulted NSG then met w/ family. Recommended downgrade of diet to a Dysphagia 1 w/ thin liquids; meds crushed in puree; strict aspiration precautions and feeding of any po's only when fully awake/engaged in task. ST services discussed pt's status and risk for dysphagia/aspiration at length w/ husband/family in room. ST services will f/u tomorrow for assessment. NSG updated.    , 09/21/2016, 3:45 PM  , MS, CCC-SLP   

## 2016-09-21 NOTE — Progress Notes (Addendum)
After the peer to peer was completed [facility] Medicare denied SNF stay. Clinical Education officer, museum (CSW) left patient's husband a Advertising account executive. CSW contacted patient's son Lynnette Caffey and made him aware of above. Per son husband should be on his way to [facility]. Per MD patient may be ready for D/C tomorrow. [place] staff member is aware of above and will come speak to patient's husband this afternoon about continuing private pay at [facility].  Patient's husband is aware that [facility] Medicare denied SNF. Husband is willing to pay privately at [facility]. CSW discussed possible palliative care and hospice with husband who appeared agreeable to starting with palliative. Rollene Fare met with husband and discussed the financial details about [facility]. Per Rollene Fare patient can come to [place] when stable for D/C.   [employer], LCSW [phone]

## 2016-09-21 NOTE — Progress Notes (Signed)
PT Cancellation Note  Patient Details Name: Deborah Espinoza MRN: [id number] DOB: 1933-04-09   Cancelled Treatment:    Reason Eval/Treat Not Completed: Fatigue/lethargy limiting ability to participate. Pt sleeping soundly upon arrival; able to partially awaken through voice and touch. Pt unable to verbalize any comprehensible words/answers to questions. Pt unable to follow any commands with attempted lower extremity exercises/movement. Pt does not track therapists voice with eyes or head turns. Unable to participate in any meaningful PT. Re attempt treatment tomorrow.    Larae Grooms, PTA 09/21/2016, 2:30 PM

## 2016-09-21 NOTE — Progress Notes (Signed)
Medical consult follow-up for her tachycardia. 82 year old female patient with the dementia hypertension, hyperlipidemia had a reverse the right shoulder arthroplasty on November 13, medical consult was requested because of tachycardia. Patient noted to have some rigors and shaking and trembling in the bed last night. And she received Dilaudid for pain and after that her symptoms improved.  Today morning I saw the patient, she is completely demented but not in distress. Labs showed 0.03 of troponins. Lactic acid 1.8. Vitals; temperature 99.6, blood pressure 147/83, heart rate 98.  Patient is awake but not oriented to time ,place, person\  Right shoulder in sling. Abdomen soft nontender nondistended muscle present. Cardiovascular S1-S2 regular Lungs clear to auscultation bilaterally but decreased at the bases.  Lab data showed sodium of 144, potassium 4.1 ,chloride 112 ,bicarbonate 27, BUN 19, creatinine 1.12  CBC WBC 17, hemoglobin 11.9, hematocrit 36.2, platelets 257.  Assessment and plan tachycardia: Resolved likely due to pain: Patient is able to metoprolol 5 mg 1 time  Around 1am.  #2 leukocytosis: Patient has bilateral basialr  atelactesis;x-ray of the chest, UA looks clear; follow CBC closely, speech therapy evaluation for possible aspiration. Will follow the patient. Time;15  min

## 2016-09-22 LAB — CBC
HEMATOCRIT: 33.2 % — AB (ref 35.0–47.0)
HEMOGLOBIN: 11 g/dL — AB (ref 12.0–16.0)
MCH: 31.3 pg (ref 26.0–34.0)
MCHC: 33.3 g/dL (ref 32.0–36.0)
MCV: 94.1 fL (ref 80.0–100.0)
Platelets: 271 10*3/uL (ref 150–440)
RBC: 3.53 MIL/uL — ABNORMAL LOW (ref 3.80–5.20)
RDW: 14.4 % (ref 11.5–14.5)
WBC: 16.1 10*3/uL — ABNORMAL HIGH (ref 3.6–11.0)

## 2016-09-22 MED ORDER — LEVOFLOXACIN 750 MG PO TABS: 750.0000 mg | ORAL_TABLET | Freq: Every day | ORAL | 0 refills | Status: DC

## 2016-09-22 NOTE — Progress Notes (Signed)
Nutrition Follow-up  DOCUMENTATION CODES:   Severe malnutrition in context of chronic illness  INTERVENTION:  1. Continue NDD1-thin per SLP  NUTRITION DIAGNOSIS:   Malnutrition related to chronic illness as evidenced by severe depletion of muscle mass, severe depletion of body fat. -ongoing  GOAL:   Patient will meet greater than or equal to 90% of their needs -progressing  MONITOR:   PO intake, I & O's, Labs, Weight trends, Supplement acceptance  REASON FOR ASSESSMENT:   Malnutrition Screening Tool    ASSESSMENT:   Deborah Espinoza is a 82 yo female who suffered a fall and is now s/p reverse right total shoulder arthoplasty  Patient consuming yogurt and magic cup with meals PO 75-100% yesterday and this morning. Patient to transition to hospice following stay. Labs and medications reviewed. No acute complaints.  Diet Order:  DIET - DYS 1 Room service appropriate? No; Fluid consistency: Thin  Skin:  Wound (see comment) (Stg I to coccyx)  Last BM:  PTA  Height:   Ht Readings from Last 1 Encounters:  09/12/16 5' (1.524 m)    Weight:   Wt Readings from Last 1 Encounters:  09/12/16 95 lb 11.2 oz (43.4 kg)    Ideal Body Weight:  45.45 kg  BMI:  There is no height or weight on file to calculate BMI.  Estimated Nutritional Needs:   Kcal:  1080 - 1300 calories  Protein:  52-64 gm  Fluid:  >/= 1.1L  EDUCATION NEEDS:   No education needs identified at this time  Satira Anis. Rhianne Soman, MS, RD LDN Inpatient Clinical Dietitian Pager [phone]

## 2016-09-22 NOTE — Care Management Important Message (Signed)
Important Message  Patient Details  Name: Deborah Espinoza MRN: [id number] Date of Birth: Sep 17, 1933   Medicare Important Message Given:  Yes    Jolly Mango, RN 09/22/2016, 3:10 PM

## 2016-09-22 NOTE — Progress Notes (Signed)
Medical consult follow-up for her tachycardia. 82 year old female patient with the dementia hypertension, hyperlipidemia had a reverse the right shoulder arthroplasty on November 13, medical consult was requested because of tachycardia. Patient noted to have some rigors and shaking and trembling in the bed last night. And she received Dilaudid for pain and after that her symptoms improved. No overnight changes, Temperature; 98.1, 148/70, heart rate 54, saturation 93% on room air,     Patient is awake but not oriented to time ,place, person\  Right shoulder in sling. Abdomen soft nontender nondistended muscle present. Cardiovascular S1-S2 regular Lungs clear to auscultation bilaterally but decreased at the bases. CBC WBC 16.1 hemoglobin 11 hematocrit 33.2 platelets 271.   Assessment and plan tachycardia: Resolved likely due to pain:  #2 leukocytosis: Patient has bilateral basialr  atelactesis;x-ray of the chest, UA looks clear;wbc down to 16/ can be discharged to nursing home with Levaquin by mouth for 5 days, recommend checking the CBC again at the nursing home in one week to make sure it is coming down. Can be discharged to nursing home with hospice care.

## 2016-09-22 NOTE — Evaluation (Signed)
Clinical/Bedside Swallow Evaluation Patient Details  Name: Deborah Espinoza MRN: [id number] Date of Birth: 06/24/1933  Today's Date: 09/22/2016 Time: SLP Start Time (ACUTE ONLY): 57 SLP Stop Time (ACUTE ONLY): 1215 SLP Time Calculation (min) (ACUTE ONLY): 45 min  Past Medical History:  Past Medical History:  Diagnosis Date  . Allergic rhinitis, cause unspecified   . Breast cancer ([place])    right  . Cataracts, bilateral    complications from eye surgery  . Dizziness and giddiness   . Edema   . Elevated blood pressure reading without diagnosis of hypertension   . External hemorrhoids without mention of complication   . GERD (gastroesophageal reflux disease)   . Lichen, unspecified    sclerosis  . Memory loss   . Mixed hyperlipidemia   . Osteoporosis, unspecified   . Other constipation   . Personal history of colonic polyps   . Sprain of neck   . Unspecified constipation   . Unspecified essential hypertension   . Unspecified hearing loss   . Urticaria, unspecified   . Varicose veins of lower extremities with other complications    Past Surgical History:  Past Surgical History:  Procedure Laterality Date  . BILATERAL OOPHORECTOMY     bilateral  . BREAST BIOPSY  12/07   pos ductal carcinoma in situ  . BREAST LUMPECTOMY  2008   RIGHT  . CATARACT EXTRACTION  04/2003  . COLONOSCOPY  11/02   polyp  . COLONOSCOPY  09/2004   polyps  . COLONOSCOPY  11/08   adenomatous polyps  . DEXA  2001   Osteopenia  . DEXA     improved OP  . DEXA  11/2004   OP  . DEXA  2/09   OP T score-3.0 spine and -2.5 in fem neck  . nuclear stress  05/2007   negative  . ovarian tumor  11/04   benign  . PARTIAL HYSTERECTOMY  1987   fibroids  . REVERSE SHOULDER ARTHROPLASTY Right 09/19/2016   Procedure: REVERSE RIGHT TOTAL SHOULDER ARTHROPLASTY;  Surgeon: Corky Mull, MD;  Location: [facility] ORS;  Service: Orthopedics;  Laterality: Right;  . stress perfusion cardiac study  7/22008   Normal with  normal EF   HPI:  Pt 82 year old female patient with the dementia hypertension, hyperlipidemia had a reverse the right shoulder arthroplasty on November 13, medical consult was requested because of tachycardia. Pt has a baseline of moderate+ Dementia which pt's husband/family indicated had worsened since admission. Pt indicate discomfort/pain but was not able to indicate where - NSG informed; pt repositioned. Pt has been lethargic post surgery and w/ pain medications not taking much po's. However, this morning pt was more aroused and ate breakfast meal w/ NSG assisting in feeding her. Pt only gave 1-2 verbal responses but not consistently accurate.    Assessment / Plan / Recommendation Clinical Impression  Pt appears at increased risk for aspiration d/t presenting Cognitive decline/baseline Dementia and overall weakness w/ challenged positioning d/t recent shoulder surgery. Pt also experiences pain/discomfort from the shoulder surgery and requires pain medications - such medications could increase drowsiness. Pt's oral phase c/b increased A-P transit time when pt was distracted; adequate oral clearing given time. Noted overt coughing x1 w/ trials of thin liquids but suspect related to the swishing and holding(pt also bit on the straw); single trials and small amounts appeared helpful. Pt requires full feeding assistance and strict aspiration precautions. Recommend a Dysphagia 1 w/ thin liquids diet w/ strict aspiration precautions; monitoring  for toleration and need for downgrade of liquid consistency if indicated. Recommend medications in puree - crushed.     Aspiration Risk  Mild aspiration risk;Moderate aspiration risk    Diet Recommendation  Dysphagia 1 w/ thin liquids - strict aspiration precautions; feeding assistance at all meals; positioning upright w/ head forward  Medication Administration: Crushed with puree (as able)    Other  Recommendations Recommended Consults:  (dietician f/u) Oral  Care Recommendations: Oral care BID;Staff/trained caregiver to provide oral care   Follow up Recommendations  (TBD)      Frequency and Duration min 2x/week  2 weeks       Prognosis Prognosis for Safe Diet Advancement: Fair Barriers to Reach Goals: Cognitive deficits;Severity of deficits      Swallow Study   General Date of Onset: 09/19/16 HPI: Pt 82 year old female patient with the dementia hypertension, hyperlipidemia had a reverse the right shoulder arthroplasty on November 13, medical consult was requested because of tachycardia. Pt has a baseline of moderate+ Dementia which pt's husband/family indicated had worsened since admission. Pt indicate discomfort/pain but was not able to indicate where - NSG informed; pt repositioned. Pt has been lethargic post surgery and w/ pain medications not taking much po's. However, this morning pt was more aroused and ate breakfast meal w/ NSG assisting in feeding her. Pt only gave 1-2 verbal responses but not consistently accurate.  Type of Study: Bedside Swallow Evaluation Previous Swallow Assessment: none noted Diet Prior to this Study: Dysphagia 2 (chopped);Thin liquids (chopped foods at home per husband) Temperature Spikes Noted: No (wbc elevated) Respiratory Status: Nasal cannula (2 liters) History of Recent Intubation: No Behavior/Cognition: Cooperative;Pleasant mood;Confused;Distractible;Requires cueing;Agitated Oral Cavity Assessment: Dried secretions;Dry (min) Oral Care Completed by SLP: Yes (attempted) Oral Cavity - Dentition:  (some dentition) Vision:  (n/a) Self-Feeding Abilities: Total assist Patient Positioning: Upright in bed Baseline Vocal Quality: Low vocal intensity (mumbled speech) Volitional Swallow: Unable to elicit    Oral/Motor/Sensory Function Overall Oral Motor/Sensory Function:  (unable to fully assess d/t Cognition)   Ice Chips Ice chips: Within functional limits Presentation: Spoon (fed; 3 trials) Other Comments:  bit the spoon x1   Thin Liquid Thin Liquid: Impaired Presentation: Straw (fed/supported; 10 trials) Oral Phase Impairments: Poor awareness of bolus (and straw; bit the straw and wouldn't release) Oral Phase Functional Implications: Oral holding (swishing noted x1) Pharyngeal  Phase Impairments: Cough - Immediate (x1) Other Comments: suspect related to the swishing and oral phase deficits; single trials and small amounts appeared helpful    Nectar Thick Nectar Thick Liquid: Not tested   Honey Thick Honey Thick Liquid: Not tested   Puree Puree: Impaired Oral Phase Impairments: Poor awareness of bolus (and spoon; but the spoon x1) Oral Phase Functional Implications: Prolonged oral transit Pharyngeal Phase Impairments:  (none) Other Comments: licked lips to clear   Solid   GO   Solid: Not tested         Orinda Kenner, MS, CCC-SLP Watson,Katherine 09/22/2016,2:49 PM

## 2016-09-22 NOTE — Progress Notes (Addendum)
Patient is medically stable for discharge back to [facility]. Per [facility] admissions coordinator at [facility] patient can come to room 606. Palliative will follow at [facility]. Santiago Glad, hospice liaison is aware of above. [employer] did not grant authorization for SNF. Patient's husband will continue to pay privately. Social work Theatre manager sent D/C orders in [place]. Social work Theatre manager met with patient and patient's husband at bedside and explained that patient will D/C today to [facility]. Patient's husband verbally agreed he understood. RN will call report and arrange EMS for transport. Please re-consult if future social work needs arise. Social work Theatre manager singing off.   Danie Chandler, Social Work Intern  [phone]

## 2016-09-22 NOTE — Progress Notes (Signed)
Physical Therapy Treatment Patient Details Name: SAYANI SULKOWSKI MRN: [id number] DOB: [birth year] Today's Date: 09/22/2016    History of Present Illness Pt is an 82 y.o. female who was recently admitted to [facility] with a Right Proximal Humerus Fracture and distal phalanx R thumb nondisplaced fx (09/12/16).  Pt then discharged to [facility] for respite care until current admission for surgery. Pt underwent a Reverse Total Shoulder Replacement 09/19/16. Pt has a history of Dementia, htn, and HLD.    PT Comments    Pt continues lethargic today; however, a little bit more responsive to name and attempting to answer questions. Unable to comprehend most verbalization, but does answer "yes" to pain question. Pt was medicated prior to session. Pt grimaces and makes painful face with all limb movements and touch. R hand very swollen and bruising throughout all extremities. Pt provides some attempt at a couple of lower extremity exercises. Pt offered attempted sit to the edge of bed; however, pt with louder groan and does not tolerate touch well to attempt. Continue PT attempts to progress participation and tolerance to extremity movement to improve functional mobility.   Follow Up Recommendations  SNF     Equipment Recommendations       Recommendations for Other Services       Precautions / Restrictions Precautions Precautions: Fall;Shoulder Restrictions Weight Bearing Restrictions: Yes RLE Weight Bearing: Non weight bearing    Mobility  Bed Mobility               General bed mobility comments: Not tested; pt does not tolerate AAROM/touch well  Transfers                    Ambulation/Gait                 Stairs            Wheelchair Mobility    Modified Rankin (Stroke Patients Only)       Balance                                    Cognition Arousal/Alertness: Lethargic Behavior During Therapy: Anxious Overall Cognitive Status:  History of cognitive impairments - at baseline                      Exercises General Exercises - Lower Extremity Ankle Circles/Pumps: PROM;Both;10 reps;Supine Short Arc Quad: AAROM;Both;10 reps;Supine Heel Slides: AAROM;Both;10 reps;Supine Hip ABduction/ADduction: PROM;Both;10 reps;Supine Other Exercises Other Exercises: finger/hand extension 10x B    General Comments        Pertinent Vitals/Pain Pain Assessment: Faces Faces Pain Scale: Hurts whole lot Pain Location: Makes painful face/grimaces with all motion/touch Pain Intervention(s): Limited activity within patient's tolerance    Home Living                      Prior Function            PT Goals (current goals can now be found in the care plan section) Progress towards PT goals: Not progressing toward goals - comment    Frequency    7X/week      PT Plan Current plan remains appropriate    Co-evaluation             End of Session Equipment Utilized During Treatment: Oxygen Activity Tolerance: Patient limited by fatigue;Patient limited by pain Patient left: in bed;with  call bell/phone within reach;with bed alarm set     Time: 1004-1019 PT Time Calculation (min) (ACUTE ONLY): 15 min  Charges:  $Therapeutic Exercise: 8-22 mins                    G Codes:      Larae Grooms, PTA 09/22/2016, 12:42 PM

## 2016-09-22 NOTE — Progress Notes (Signed)
Pt being discharge to [facility] place. I attempted to call report to facility x3 with no answer. PIV was removed. Discharge packet/summary sent with pt. She will receive hospice/palliative services at facility along with PT/OT. She is leaving with all her belongings, husband is aware and in agreement with with plan. Prescriptions are included with packet. She is in stable condition and will follow up with MD as scheduled. She will be transported via EMS.

## 2016-09-22 NOTE — Progress Notes (Signed)
Subjective: 3 Days Post-Op Procedure(s) (LRB): REVERSE RIGHT TOTAL SHOULDER ARTHROPLASTY (Right) Patient reports pain as mild, however not able to obtain an accurate score from the patient. Patient is well, but has had some minor complaints of fever Plan is to go hospice after hospital stay. Negative for chest pain and shortness of breath Fever: 99.1 temp this AM. Gastrointestinal:Negative for nausea and vomiting  Objective: Vital signs in last 24 hours: Temp:  [98 F (36.7 C)-99.8 F (37.7 C)] 99.1 F (37.3 C) (11/16 0420) Pulse Rate:  [41-114] 54 (11/16 0728) Resp:  [16-18] 18 (11/16 0728) BP: (111-184)/(69-102) 158/75 (11/16 0728) SpO2:  [93 %-100 %] 93 % (11/16 0728)  Intake/Output from previous day:  Intake/Output Summary (Last 24 hours) at 09/22/16 0732 Last data filed at 09/21/16 1833  Gross per 24 hour  Intake              100 ml  Output                0 ml  Net              100 ml    Intake/Output this shift: No intake/output data recorded.  Labs:  Recent Labs  09/19/16 1203 09/20/16 0633 09/21/16 0129 09/22/16 0612  HGB 15.3 12.0 11.9* 11.0*    Recent Labs  09/21/16 0129 09/22/16 0612  WBC 17.0* 16.1*  RBC 3.84 3.53*  HCT 36.2 33.2*  PLT 257 271    Recent Labs  09/20/16 0633 09/21/16 0129  NA 143 144  K 4.6 4.1  CL 111 112*  CO2 27 27  BUN 26* 19  CREATININE 1.13* 1.12*  GLUCOSE 111* 107*  CALCIUM 10.8* 10.7*    Recent Labs  09/19/16 1203  INR 0.89   EXAM General - Patient is able to answer basic questions about her shoulder, but is nonverbal for the majority of the exam. Extremity - ABD soft Sensation intact distally Incision: scant drainage No cellulitis present Dressing/Incision - blood tinged drainage at the distal aspect of the incision. Motor Function - intact, moving foot and toes well on exam.  Patient is intact to light touch in the distribution of the right axillary nerve.  Past Medical History:  Diagnosis Date  .  Allergic rhinitis, cause unspecified   . Breast cancer ([place])    right  . Cataracts, bilateral    complications from eye surgery  . Dizziness and giddiness   . Edema   . Elevated blood pressure reading without diagnosis of hypertension   . External hemorrhoids without mention of complication   . GERD (gastroesophageal reflux disease)   . Lichen, unspecified    sclerosis  . Memory loss   . Mixed hyperlipidemia   . Osteoporosis, unspecified   . Other constipation   . Personal history of colonic polyps   . Sprain of neck   . Unspecified constipation   . Unspecified essential hypertension   . Unspecified hearing loss   . Urticaria, unspecified   . Varicose veins of lower extremities with other complications     Assessment/Plan: 3 Days Post-Op Procedure(s) (LRB): REVERSE RIGHT TOTAL SHOULDER ARTHROPLASTY (Right) Active Problems:   Status post reverse total shoulder replacement, right  Estimated body mass index is 18.69 kg/m as calculated from the following:   Height as of 09/12/16: 5' (1.524 m).   Weight as of 09/12/16: 43.4 kg (95 lb 11.2 oz). Up with therapy   Labs reviewed this AM, WBC trending down 16.1.  Temp 99.1  this AM., no signs of infection to the right shoulder this AM such as erythema or drainage. Internal Med Consult, CXR demonstrated mild bibasilar atelectasis. Blood cultures demonstrate no growth at this time. Hg stable this AM at 11.0 Plan for discharge has been changed to hospice care at this time, will await input from internal medicine prior to discharging today.  DVT Prophylaxis - Lovenox, Foot Pumps and TED hose Non-weightbearing to the right upper extremity.  Raquel James, PA-C [facility] 09/22/2016, 7:32 AM

## 2016-09-26 ENCOUNTER — Encounter: Payer: Self-pay | Admitting: Internal Medicine

## 2016-09-26 ENCOUNTER — Non-Acute Institutional Stay (SKILLED_NURSING_FACILITY): Payer: Medicare Other | Admitting: Internal Medicine

## 2016-09-26 DIAGNOSIS — E871 Hypo-osmolality and hyponatremia: Secondary | ICD-10-CM

## 2016-09-26 DIAGNOSIS — R5381 Other malaise: Secondary | ICD-10-CM | POA: Diagnosis not present

## 2016-09-26 DIAGNOSIS — I10 Essential (primary) hypertension: Secondary | ICD-10-CM

## 2016-09-26 DIAGNOSIS — S4291XS Fracture of right shoulder girdle, part unspecified, sequela: Secondary | ICD-10-CM

## 2016-09-26 DIAGNOSIS — F0280 Dementia in other diseases classified elsewhere without behavioral disturbance: Secondary | ICD-10-CM

## 2016-09-26 DIAGNOSIS — G309 Alzheimer's disease, unspecified: Secondary | ICD-10-CM

## 2016-09-26 DIAGNOSIS — D72828 Other elevated white blood cell count: Secondary | ICD-10-CM | POA: Diagnosis not present

## 2016-09-26 DIAGNOSIS — D62 Acute posthemorrhagic anemia: Secondary | ICD-10-CM

## 2016-09-26 LAB — CULTURE, BLOOD (ROUTINE X 2)
CULTURE: NO GROWTH
CULTURE: NO GROWTH

## 2016-09-26 NOTE — Progress Notes (Signed)
LOCATION: [facility]  PCP: Loura Pardon, MD   Code Status: Full Code  Goals of care: Advanced Directive information Advanced Directives 09/19/2016  Does patient have an advance directive? No  Would patient like information on creating an advanced directive? No - patient declined information       Extended Emergency Contact Information Primary Emergency Contact: Ciliberto,Coy Address: [address]          [place], [place] [address] [place] Phone: [phone] Relation: Spouse Secondary Emergency Contact: Quillian Quince [place] Phone: [phone] Mobile Phone: [phone] Relation: Son   Allergies  Allergen Reactions  . Cephalexin   . Clarithromycin   . Codeine   . Hydrocodone   . Penicillins   . Risedronate Sodium     Chief Complaint  Patient presents with  . New Admit To SNF    New Admission Visit     HPI:  Patient is a 82 y.o. female seen today for short term rehabilitation post hospital admission from 09/19/2016-09/22/2016 with right shoulder fracture- the neck of right humerus. She underwent right reverse shoulder arthroplasty on 09/19/2016. She is seen in her room today. She has advanced dementia and does not participate in history of present illness or review of systems.  Review of Systems: Unable to obtain   Past Medical History:  Diagnosis Date  . Allergic rhinitis, cause unspecified   . Breast cancer ([place])    right  . Cataracts, bilateral    complications from eye surgery  . Dizziness and giddiness   . Edema   . Elevated blood pressure reading without diagnosis of hypertension   . External hemorrhoids without mention of complication   . GERD (gastroesophageal reflux disease)   . Lichen, unspecified    sclerosis  . Memory loss   . Mixed hyperlipidemia   . Osteoporosis, unspecified   . Other constipation   . Personal history of colonic polyps   . Sprain of neck   . Unspecified constipation     . Unspecified essential hypertension   . Unspecified hearing loss   . Urticaria, unspecified   . Varicose veins of lower extremities with other complications    Past Surgical History:  Procedure Laterality Date  . BILATERAL OOPHORECTOMY     bilateral  . BREAST BIOPSY  12/07   pos ductal carcinoma in situ  . BREAST LUMPECTOMY  2008   RIGHT  . CATARACT EXTRACTION  04/2003  . COLONOSCOPY  11/02   polyp  . COLONOSCOPY  09/2004   polyps  . COLONOSCOPY  11/08   adenomatous polyps  . DEXA  2001   Osteopenia  . DEXA     improved OP  . DEXA  11/2004   OP  . DEXA  2/09   OP T score-3.0 spine and -2.5 in fem neck  . nuclear stress  05/2007   negative  . ovarian tumor  11/04   benign  . PARTIAL HYSTERECTOMY  1987   fibroids  . REVERSE SHOULDER ARTHROPLASTY Right 09/19/2016   Procedure: REVERSE RIGHT TOTAL SHOULDER ARTHROPLASTY;  Surgeon: Corky Mull, MD;  Location: [facility] ORS;  Service: Orthopedics;  Laterality: Right;  . stress perfusion cardiac study  7/22008   Normal with normal EF   Social History:   reports that she has never smoked. She has never used smokeless tobacco. She reports that she does not drink alcohol or use drugs.  Family History  Problem Relation Age of Onset  .  Brain cancer Father   . Diabetes Father   . Kidney cancer Mother     renal cell  . Cancer Mother   . Diabetes Brother   . Diabetes Brother   . Colon cancer Brother   . Alzheimer's disease      Aunt    Medications:   Medication List       Accurate as of 09/26/16 11:58 AM. Always use your most recent med list.          acetaminophen 325 MG tablet Commonly known as:  TYLENOL Take 2 tablets (650 mg total) by mouth every 6 (six) hours as needed for mild pain (or Fever >/= 101).   amLODipine 5 MG tablet Commonly known as:  NORVASC Take 1 tablet (5 mg total) by mouth daily.   bisacodyl 5 MG EC tablet Commonly known as:  DULCOLAX Take 1 tablet (5 mg total) by mouth daily as needed for  moderate constipation.   donepezil 10 MG tablet Commonly known as:  ARICEPT Take 10 mg by mouth at bedtime.   oxyCODONE 5 MG immediate release tablet Commonly known as:  Oxy IR/ROXICODONE Take 1-2 tablets (5-10 mg total) by mouth every 4 (four) hours as needed for breakthrough pain.   PRESERVISION AREDS PO Take 1 tablet by mouth daily.   traMADol 50 MG tablet Commonly known as:  ULTRAM Take 1 tablet (50 mg total) by mouth every 6 (six) hours as needed for moderate pain.   VITAMIN D PO Take 1 tablet by mouth daily.       Immunizations: Immunization History  Administered Date(s) Administered  . Influenza Split 09/27/2011, 08/08/2012  . Influenza Whole 08/28/2006, 08/30/2007, 09/02/2008, 08/05/2009, 09/13/2010  . Influenza,inj,Quad PF,36+ Mos 10/15/2013, 09/16/2014, 10/29/2015, 08/08/2016  . PPD Test 09/22/2016  . Pneumococcal Conjugate-13 05/06/2015  . Pneumococcal Polysaccharide-23 08/21/2003  . Td 08/19/2002, 09/13/2010  . Zoster 12/10/2011     Physical Exam: Vitals:   09/26/16 1148  BP: (!) 153/76  Pulse: 67  Resp: 20  Temp: 98.4 F (36.9 C)  TempSrc: Oral  SpO2: 97%  Weight: 95 lb 11.2 oz (43.4 kg)  Height: 5' (1.524 m)   Body mass index is 18.69 kg/m.  General- elderly female, frail and thin built, in no acute distress Head- normocephalic, atraumatic Nose- no maxillary or frontal sinus tenderness, no nasal discharge Throat- moist mucus membrane  Eyes- PERRLA, EOMI, no pallor, no icterus, no discharge, normal conjunctiva, normal sclera Neck- no cervical lymphadenopathy Cardiovascular- normal s1,s2, no murmur Respiratory- bilateral clear to auscultation, no wheeze, no rhonchi, no crackles, no use of accessory muscles Abdomen- bowel sounds present, soft, non tender Musculoskeletal- 1+ edema to right upper arm, going to the right shoulder, able to move her fingers with good capillary refill, good radial pulse, no leg edema Neurological- alert and oriented  to self Skin- warm and dry, honeycomb dressing to right arm, chronic skin changes to lower legs     Labs reviewed: Basic Metabolic Panel:  Recent Labs  09/13/16 0452 09/19/16 2208 09/20/16 0633 09/21/16 0129  NA 144 147* 143 144  K 4.2 6.1* 4.6 4.1  CL 112* 116* 111 112*  CO2 25 24 27 27   GLUCOSE 112* 179* 111* 107*  BUN 21* 34* 26* 19  CREATININE 0.84 1.21* 1.13* 1.12*  CALCIUM 10.4* 11.0* 10.8* 10.7*  MG 2.2  --   --  2.0   Liver Function Tests:  Recent Labs  08/08/16 1521 09/12/16 1419 09/13/16 0452  AST 23 50*  41  ALT 18 29 24   ALKPHOS 84 94 79  BILITOT 0.6 1.1 1.5*  PROT 6.9 7.1 6.4*  ALBUMIN 3.8 3.8 3.4*   No results for input(s): LIPASE, AMYLASE in the last 8760 hours. No results for input(s): AMMONIA in the last 8760 hours. CBC:  Recent Labs  08/08/16 1521 09/12/16 1419  09/20/16 0633 09/21/16 0129 09/22/16 0612  WBC 9.4 15.8*  < > 17.1* 17.0* 16.1*  NEUTROABS 6.4 13.0*  --  14.2*  --   --   HGB 14.0 13.9  < > 12.0 11.9* 11.0*  HCT 41.9 42.4  < > 36.4 36.2 33.2*  MCV 92.9 92.6  < > 95.4 94.3 94.1  PLT 285.0 295  < > 222 257 271  < > = values in this interval not displayed. Cardiac Enzymes:  Recent Labs  09/21/16 0129 09/21/16 0735 09/21/16 1519  TROPONINI 0.03* 0.03* <0.03   BNP: Invalid input(s): POCBNP CBG:  Recent Labs  09/21/16 0013  GLUCAP 115*    Radiological Exams: Dg Chest 2 View  Result Date: 09/21/2016 CLINICAL DATA:  Cough EXAM: CHEST  2 VIEW COMPARISON:  09/12/2016 FINDINGS: Decreased lung volume since the prior study. Mild bibasilar atelectasis. Negative for heart failure. Minimal pleural effusion. Right shoulder arthroplasty has been performed since the prior study. IMPRESSION: Hypoventilation with mild bibasilar atelectasis. Electronically Signed   By: Franchot Gallo M.D.   On: 09/21/2016 09:17   Dg Pelvis 1-2 Views  Result Date: 09/12/2016 CLINICAL DATA:  Bilateral hip pain after falls. EXAM: PELVIS - 1-2 VIEW  COMPARISON:  None.- FINDINGS: Pelvic bony ring is intact. No gross abnormality to the hips. There is gas in the rectum. No evidence for a fracture. IMPRESSION: No acute bone abnormality. Electronically Signed   By: Markus Daft M.D.   On: 09/12/2016 16:53   Dg Shoulder Right  Result Date: 09/12/2016 CLINICAL DATA:  Right shoulder pain since a fall out of bed 3 days ago. EXAM: RIGHT SHOULDER - 2+ VIEW COMPARISON:  None. FINDINGS: The patient has an acute surgical neck fracture of the right shoulder with foreshortening of approximately 3.5 cm and 1 shaft with anterior displacement. The fracture appears to involve the greater tuberosity. The humeral head remains located. The acromioclavicular joint is intact. No other acute abnormality is identified. IMPRESSION: Acute surgical neck fracture right humerus as described above. Electronically Signed   By: Inge Rise M.D.   On: 09/12/2016 19:08   Dg Knee 1-2 Views Left  Result Date: 09/12/2016 CLINICAL DATA:  Fall. EXAM: LEFT KNEE - 1-2 VIEW COMPARISON:  None. FINDINGS: No evidence of fracture, dislocation, or joint effusion. No evidence of arthropathy or other focal bone abnormality. Soft tissues are unremarkable. IMPRESSION: Negative. Electronically Signed   By: Aletta Edouard M.D.   On: 09/12/2016 16:53   Ct Head Wo Contrast  Result Date: 09/12/2016 CLINICAL DATA:  Dementia, multiple falls x4 days, altered mental status EXAM: CT HEAD WITHOUT CONTRAST CT CERVICAL SPINE WITHOUT CONTRAST TECHNIQUE: Multidetector CT imaging of the head and cervical spine was performed following the standard protocol without intravenous contrast. Multiplanar CT image reconstructions of the cervical spine were also generated. COMPARISON:  Cervical spine radiographs dated 11/09/2010 FINDINGS: CT HEAD FINDINGS Brain: No evidence of acute infarction, hemorrhage, hydrocephalus, extra-axial collection or mass lesion/mass effect. Vascular: No hyperdense vessel or unexpected  calcification. Skull: Normal. Negative for fracture or focal lesion. Sinuses/Orbits: Visualized paranasal sinuses are essentially clear. Right mastoid air cells are opacified. Other: Global cortical  atrophy.  No ventriculomegaly. Subcortical white matter and periventricular small vessel ischemic changes. Mild intracranial atherosclerosis. CT CERVICAL SPINE FINDINGS Alignment: Normal cervical lordosis. Skull base and vertebrae: No acute fracture. No primary bone lesion or focal pathologic process. Soft tissues and spinal canal: No prevertebral fluid or swelling. No visible canal hematoma. Disc levels:  Moderate degenerative changes at C5-6. Spinal canal remains patent. Upper chest: Visualized lung apices are notable for biapical pleural-parenchymal scarring. Other: Visualized thyroid is unremarkable. IMPRESSION: No evidence of acute intracranial abnormality. Atrophy with small vessel ischemic changes. No evidence of traumatic injury to the cervical spine. Moderate degenerative changes at C5-6. Electronically Signed   By: Julian Hy M.D.   On: 09/12/2016 16:10   Ct Cervical Spine Wo Contrast  Result Date: 09/12/2016 CLINICAL DATA:  Dementia, multiple falls x4 days, altered mental status EXAM: CT HEAD WITHOUT CONTRAST CT CERVICAL SPINE WITHOUT CONTRAST TECHNIQUE: Multidetector CT imaging of the head and cervical spine was performed following the standard protocol without intravenous contrast. Multiplanar CT image reconstructions of the cervical spine were also generated. COMPARISON:  Cervical spine radiographs dated 11/09/2010 FINDINGS: CT HEAD FINDINGS Brain: No evidence of acute infarction, hemorrhage, hydrocephalus, extra-axial collection or mass lesion/mass effect. Vascular: No hyperdense vessel or unexpected calcification. Skull: Normal. Negative for fracture or focal lesion. Sinuses/Orbits: Visualized paranasal sinuses are essentially clear. Right mastoid air cells are opacified. Other: Global cortical  atrophy.  No ventriculomegaly. Subcortical white matter and periventricular small vessel ischemic changes. Mild intracranial atherosclerosis. CT CERVICAL SPINE FINDINGS Alignment: Normal cervical lordosis. Skull base and vertebrae: No acute fracture. No primary bone lesion or focal pathologic process. Soft tissues and spinal canal: No prevertebral fluid or swelling. No visible canal hematoma. Disc levels:  Moderate degenerative changes at C5-6. Spinal canal remains patent. Upper chest: Visualized lung apices are notable for biapical pleural-parenchymal scarring. Other: Visualized thyroid is unremarkable. IMPRESSION: No evidence of acute intracranial abnormality. Atrophy with small vessel ischemic changes. No evidence of traumatic injury to the cervical spine. Moderate degenerative changes at C5-6. Electronically Signed   By: Julian Hy M.D.   On: 09/12/2016 16:10   Ct Shoulder Right Wo Contrast  Result Date: 09/13/2016 CLINICAL DATA:  Status post fall 4 days ago with a right humerus fracture. Preoperative planning examination. EXAM: CT OF THE RIGHT SHOULDER WITHOUT CONTRAST TECHNIQUE: Multidetector CT imaging was performed according to the standard protocol. Multiplanar CT image reconstructions were also generated. COMPARISON:  Plain films right shoulder 09/12/2016. PA and lateral chest 05/08/2014. FINDINGS: As seen on the comparison plain films, the patient has a fracture of the surgical neck of the right humerus. The shaft of the humerus demonstrates anterior displacement of approximately 4.2 cm. The humeral head is rotated with the greater tuberosity directed toward the superior margin of the glenoid. There is fragment override of approximately 2 cm. The fracture extends through the greater tuberosity most notable posteriorly where there is distraction of approximately 0.7 cm and mild comminution. The lesser tuberosity is spared. The humeral head remains located. The acromioclavicular joint is intact  and unremarkable. The rotator cuff of and long head of biceps tendon appear intact. The patient has a severe compression fracture deformity of T7. Milder compression fracture of T8 is also identified. These fractures are remote and seen on the comparison plain films of the chest. Aortic atherosclerosis is identified. Imaged lung parenchyma is clear. IMPRESSION: Acute surgical neck fracture of the humerus with anterior displacement of approximately 4.2 cm and fragment override of approximately 2  cm. The fracture extends into the greater tuberosity where there is mild distraction and comminution. As noted above, the humeral head is rotated due to retraction by the rotator cuff with the greater tuberosity directed toward the glenoid. Remote T7 and T8 compression fractures. Calcific aortic atherosclerosis. Electronically Signed   By: Inge Rise M.D.   On: 09/13/2016 13:16   Dg Hand 2 View Right  Result Date: 09/12/2016 CLINICAL DATA:  82 year old female with a history of fall. Arm pain. EXAM: RIGHT HAND - 2 VIEW COMPARISON:  None. FINDINGS: Osteopenia. Acute nondisplaced fracture at the base of the distal phalanx of the right thumb. Advanced osteoarthritis of the right hand.  No erosive changes. No radiopaque foreign body. IMPRESSION: Acute nondisplaced fracture at the base of the distal phalanx of the right thumb. Osteopenia and advanced osteoarthritis. Signed, Dulcy Fanny. Earleen Newport, DO [facility] Electronically Signed   By: Corrie Mckusick D.O.   On: 09/12/2016 16:52   Dg Chest Port 1 View  Result Date: 09/12/2016 CLINICAL DATA:  Multiple falls.  Humerus fracture. EXAM: PORTABLE CHEST 1 VIEW COMPARISON:  Right humerus 09/22/2016 and chest radiography 05/08/2014 FINDINGS: There is a displaced fracture of the right humeral neck. The distal humeral component is displaced medially. Right AC joint appears to be intact. Both lungs are clear. Negative for  pneumothorax. Atherosclerotic calcifications at the aortic arch. Heart size is within normal limits. IMPRESSION: No acute chest abnormality. Displaced fracture of the right humeral neck. Aortic atherosclerosis. Electronically Signed   By: Markus Daft M.D.   On: 09/12/2016 17:04   Dg Shoulder Right Port  Result Date: 09/19/2016 CLINICAL DATA:  Post right shoulder replacement. EXAM: PORTABLE RIGHT SHOULDER COMPARISON:  09/12/2016 FINDINGS: Right total shoulder arthroplasty was performed. Skin staples are noted. Visualized right lung is clear. Right shoulder arthroplasty appears to be located. IMPRESSION: Right shoulder arthroplasty.  No complicating features. Electronically Signed   By: Markus Daft M.D.   On: 09/19/2016 17:17   Dg Humerus Right  Result Date: 09/12/2016 CLINICAL DATA:  Fall with right arm injury.  Initial encounter. EXAM: RIGHT HUMERUS - 2+ VIEW COMPARISON:  None. FINDINGS: There is a fracture of the right humeral neck with medial displacement of the humeral shaft relative to the humeral head. There also likely is a fracture of the humeral head through the level of the greater tuberosity and the humeral head appears abnormally rotated. IMPRESSION: Displaced fracture of the surgical neck of the proximal right humerus. Electronically Signed   By: Aletta Edouard M.D.   On: 09/12/2016 16:52   Dg Knee 2 Views Right  Result Date: 09/12/2016 CLINICAL DATA:  82 year old female with a history of dementia and fall. EXAM: RIGHT KNEE - 3 VIEW COMPARISON:  None. FINDINGS: No acute displaced fracture. No focal soft tissue swelling.  No joint effusion. Osteopenia. IMPRESSION: No acute bony abnormality. Signed, Dulcy Fanny. Earleen Newport, DO [facility] Electronically Signed   By: Corrie Mckusick D.O.   On: 09/12/2016 16:53    Assessment/Plan  Physical deconditioning With generalized weakness. Will have her to work with physical therapy and occupational  therapy to help regain her strength and range of motion as tolerated.  Closed displaced proximal humerus fracture of right shoulder Status post reverse right total shoulder arthroplasty on 09/19/2016. Currently on tramadol 50 mg every 6 hours as needed and oxycodone immediate release 5 mg 1-2 tablets every 4 are as needed for pain. Given her  advanced dementia, patient does not remember 2 hours for pain medication. Will start home on Tylenol 650 mg 4 times a day to obtain better pain control and monitor. Make orthopedic follow-up. Continue vitamin D supplement.  Leukocytosis Afebrile. Likely reactive leukocytosis. Monitor WBC. Send urine sample for study to rule out infection.  Acute blood loss anemia Post surgery, monitor cbc  Hyponatremia Check bmp, maintain hydration  Hypertension Continue amlodipine 5 mg daily.  Alzheimer's dementia Provide supportive care. Continue donepezil 10 mg daily.   Goals of care: short term rehabilitation but possible long-term care   Labs/tests ordered: CBC, CMP 09/27/2016  Family/ staff Communication: reviewed care plan with patient and nursing supervisor    Blanchie Serve, MD Internal Medicine [place], [place] [address] Cell Phone (Monday-Friday 8 am - 5 pm): [phone] On Call: [phone] and follow prompts after 5 pm and on weekends Office Phone: [phone] Office Fax: [phone]

## 2016-09-27 LAB — BASIC METABOLIC PANEL
BUN: 17 mg/dL (ref 4–21)
Creatinine: 1 mg/dL (ref 0.5–1.1)
Glucose: 96 mg/dL
Potassium: 3.7 mmol/L (ref 3.4–5.3)
Sodium: 152 mmol/L — AB (ref 137–147)

## 2016-09-27 LAB — CBC AND DIFFERENTIAL
HEMATOCRIT: 35 % — AB (ref 36–46)
Hemoglobin: 11.2 g/dL — AB (ref 12.0–16.0)
PLATELETS: 382 10*3/uL (ref 150–399)
WBC: 11.9 10*3/mL

## 2016-09-27 LAB — HEPATIC FUNCTION PANEL
ALT: 16 U/L (ref 7–35)
AST: 21 U/L (ref 13–35)

## 2016-09-28 ENCOUNTER — Non-Acute Institutional Stay (SKILLED_NURSING_FACILITY): Payer: Medicare Other | Admitting: Family

## 2016-09-28 DIAGNOSIS — E870 Hyperosmolality and hypernatremia: Secondary | ICD-10-CM

## 2016-09-28 DIAGNOSIS — E440 Moderate protein-calorie malnutrition: Secondary | ICD-10-CM

## 2016-09-28 DIAGNOSIS — D72829 Elevated white blood cell count, unspecified: Secondary | ICD-10-CM

## 2016-09-28 NOTE — Progress Notes (Signed)
Location:  [place] Room Number: [place] of Service:  SNF (31) Provider:  Dinah Ngetich FNP-C   Loura Pardon, MD  Patient Care Team: Abner Greenspan, MD as PCP - General  Extended Emergency Contact Information Primary Emergency Contact: Alfredo,Coy Address: [address]          [place], [place] [address] Johnnette Litter of [place] Phone: [phone] Relation: Spouse Secondary Emergency Contact: Maeser of [place] Phone: [phone] Mobile Phone: [phone] Relation: Son  Code Status: Full code  Goals of care: Advanced Directive information Advanced Directives 09/19/2016  Does Patient Have a Medical Advance Directive? No  Would patient like information on creating a medical advance directive? No - patient declined information     Chief Complaint  Patient presents with  . Acute Visit    abnormal labs     HPI:  Pt is a 82 y.o. female seen today at [facility]  for an acute visit for evaluation of abnormal lab results. She is seen in her room today. She is alert but unable to make needs know. She smiles at provider during visit occassionally whispers words but unclear. Her recent lab results showed WBC 11.9, Na 152, TP 5.4, Alb 2.96 ( 09/27/2016). Previous WBC 16.1 ( 09/22/2016). She is post reverse right total shoulder arthroplasty.    Past Medical History:  Diagnosis Date  . Allergic rhinitis, cause unspecified   . Breast cancer ([place])    right  . Cataracts, bilateral    complications from eye surgery  . Dizziness and giddiness   . Edema   . Elevated blood pressure reading without diagnosis of hypertension   . External hemorrhoids without mention of complication   . GERD (gastroesophageal reflux disease)   . Lichen, unspecified    sclerosis  . Memory loss   . Mixed hyperlipidemia   . Osteoporosis, unspecified   . Other constipation   . Personal history of colonic polyps   .  Sprain of neck   . Unspecified constipation   . Unspecified essential hypertension   . Unspecified hearing loss   . Urticaria, unspecified   . Varicose veins of lower extremities with other complications    Past Surgical History:  Procedure Laterality Date  . BILATERAL OOPHORECTOMY     bilateral  . BREAST BIOPSY  12/07   pos ductal carcinoma in situ  . BREAST LUMPECTOMY  2008   RIGHT  . CATARACT EXTRACTION  04/2003  . COLONOSCOPY  11/02   polyp  . COLONOSCOPY  09/2004   polyps  . COLONOSCOPY  11/08   adenomatous polyps  . DEXA  2001   Osteopenia  . DEXA     improved OP  . DEXA  11/2004   OP  . DEXA  2/09   OP T score-3.0 spine and -2.5 in fem neck  . nuclear stress  05/2007   negative  . ovarian tumor  11/04   benign  . PARTIAL HYSTERECTOMY  1987   fibroids  . REVERSE SHOULDER ARTHROPLASTY Right 09/19/2016   Procedure: REVERSE RIGHT TOTAL SHOULDER ARTHROPLASTY;  Surgeon: Corky Mull, MD;  Location: [facility] ORS;  Service: Orthopedics;  Laterality: Right;  . stress perfusion cardiac study  7/22008   Normal with normal EF    Allergies  Allergen Reactions  . Cephalexin   . Clarithromycin   . Codeine   . Hydrocodone   . Penicillins   . Risedronate  Sodium       Medication List       Accurate as of 09/28/16  4:49 PM. Always use your most recent med list.          acetaminophen 325 MG tablet Commonly known as:  TYLENOL Take 2 tablets (650 mg total) by mouth every 6 (six) hours as needed for mild pain (or Fever >/= 101).   amLODipine 5 MG tablet Commonly known as:  NORVASC Take 1 tablet (5 mg total) by mouth daily.   bisacodyl 5 MG EC tablet Commonly known as:  DULCOLAX Take 1 tablet (5 mg total) by mouth daily as needed for moderate constipation.   donepezil 10 MG tablet Commonly known as:  ARICEPT Take 10 mg by mouth at bedtime.   oxyCODONE 5 MG immediate release tablet Commonly known as:  Oxy IR/ROXICODONE Take 1-2 tablets (5-10 mg total) by mouth  every 4 (four) hours as needed for breakthrough pain.   PRESERVISION AREDS PO Take 1 tablet by mouth daily.   traMADol 50 MG tablet Commonly known as:  ULTRAM Take 1 tablet (50 mg total) by mouth every 6 (six) hours as needed for moderate pain.   VITAMIN D PO Take 1 tablet by mouth daily.       Review of Systems  Unable to perform ROS: Dementia    Immunization History  Administered Date(s) Administered  . Influenza Split 09/27/2011, 08/08/2012  . Influenza Whole 08/28/2006, 08/30/2007, 09/02/2008, 08/05/2009, 09/13/2010  . Influenza,inj,Quad PF,36+ Mos 10/15/2013, 09/16/2014, 10/29/2015, 08/08/2016  . PPD Test 09/22/2016  . Pneumococcal Conjugate-13 05/06/2015  . Pneumococcal Polysaccharide-23 08/21/2003  . Td 08/19/2002, 09/13/2010  . Zoster 12/10/2011   Pertinent  Health Maintenance Due  Topic Date Due  . INFLUENZA VACCINE  Completed  . DEXA SCAN  Completed  . PNA vac Low Risk Adult  Completed   Fall Risk  05/06/2015  Falls in the past year? No      Vitals:   09/28/16 1015  BP: 128/67  Pulse: 82  Resp: 18  Temp: 98.5 F (36.9 C)  SpO2: 97%  Weight: 94 lb (42.6 kg)  Height: 5' (1.524 m)   Body mass index is 18.36 kg/m. Physical Exam  Constitutional: She appears well-developed and well-nourished. No distress.  Whispers occasionally during visit but able to make needs known.   HENT:  Head: Normocephalic.  Mouth/Throat: Oropharynx is clear and moist. No oropharyngeal exudate.  Eyes: Conjunctivae and EOM are normal. Pupils are equal, round, and reactive to light. Right eye exhibits no discharge. Left eye exhibits no discharge. No scleral icterus.  Neck: Normal range of motion. No JVD present.  Cardiovascular: Normal rate, regular rhythm, normal heart sounds and intact distal pulses.  Exam reveals no gallop and no friction rub.   No murmur heard. Pulmonary/Chest: Effort normal and breath sounds normal. No respiratory distress. She has no wheezes. She has no  rales.  Abdominal: Soft. Bowel sounds are normal. She exhibits no distension. There is no tenderness. There is no rebound and no guarding.  Genitourinary:  Genitourinary Comments: Incontinent for both bowel and bladder  Musculoskeletal: She exhibits no edema, tenderness or deformity.  Moves extremities except right hand sling in place.   Lymphadenopathy:    She has no cervical adenopathy.  Neurological: She is alert.  Skin: Skin is warm and dry. No rash noted. No erythema. No pallor.  Right shoulder honey comb drsg intact surrounding skin without signs of infections.   Psychiatric: She has a normal  mood and affect.    Labs reviewed:  Recent Labs  09/13/16 0452 09/19/16 2208 09/20/16 0633 09/21/16 0129 09/27/16  NA 144 147* 143 144 152*  K 4.2 6.1* 4.6 4.1 3.7  CL 112* 116* 111 112*  --   CO2 25 24 27 27   --   GLUCOSE 112* 179* 111* 107*  --   BUN 21* 34* 26* 19 17  CREATININE 0.84 1.21* 1.13* 1.12* 1.0  CALCIUM 10.4* 11.0* 10.8* 10.7*  --   MG 2.2  --   --  2.0  --     Recent Labs  08/08/16 1521 09/12/16 1419 09/13/16 0452 09/27/16  AST 23 50* 41 21  ALT 18 29 24 16   ALKPHOS 84 94 79  --   BILITOT 0.6 1.1 1.5*  --   PROT 6.9 7.1 6.4*  --   ALBUMIN 3.8 3.8 3.4*  --     Recent Labs  08/08/16 1521 09/12/16 1419  09/20/16 0633 09/21/16 0129 09/22/16 0612 09/27/16  WBC 9.4 15.8*  < > 17.1* 17.0* 16.1* 11.9  NEUTROABS 6.4 13.0*  --  14.2*  --   --   --   HGB 14.0 13.9  < > 12.0 11.9* 11.0* 11.2*  HCT 41.9 42.4  < > 36.4 36.2 33.2* 35*  MCV 92.9 92.6  < > 95.4 94.3 94.1  --   PLT 285.0 295  < > 222 257 271 382  < > = values in this interval not displayed. Lab Results  Component Value Date   TSH 1.50 08/08/2016   Lab Results  Component Value Date   HGBA1C 5.9 12/07/2012   Lab Results  Component Value Date   CHOL 169 08/08/2016   HDL 58.20 08/08/2016   LDLCALC 92 08/08/2016   LDLDIRECT 154.4 08/30/2007   TRIG 96.0 08/08/2016   CHOLHDL 3 08/08/2016    Assessment/Plan 1. Leukocytosis, unspecified type WBC 11.9 ( 09/27/2016). Previous WBC 16.1 ( 09/22/2016).Afebrile. WBC trending down. Will continue to monitor Temp curve and CBC. Obtain CBC/diff 10/03/2016.   2. Hypernatremia  Na 152 ( 09/27/2016). Previous 152. Stable. Will encourage fluid intake. Repeat BMP 10/03/2016.   3. Moderate protein-calorie malnutrition (HCC)  TP 5.4, Alb 2.96 ( 09/27/2016). Currently on Med pass 2.0 twice daily. RD consult for supplement evaluation.     Family/ staff Communication: Reviewed plan of care with facility Nurse supervisor.   Labs/tests ordered:  CBC/diff, BMP 10/03/2016

## 2016-10-03 ENCOUNTER — Encounter: Payer: Self-pay | Admitting: Emergency Medicine

## 2016-10-03 ENCOUNTER — Inpatient Hospital Stay
Admission: EM | Admit: 2016-10-03 | Discharge: 2016-10-07 | DRG: 640 | Disposition: A | Discharge status: Skilled Nursing Facility | Payer: Medicare Other | Attending: Internal Medicine | Admitting: Internal Medicine

## 2016-10-03 DIAGNOSIS — E782 Mixed hyperlipidemia: Secondary | ICD-10-CM | POA: Diagnosis present

## 2016-10-03 DIAGNOSIS — K219 Gastro-esophageal reflux disease without esophagitis: Secondary | ICD-10-CM | POA: Diagnosis present

## 2016-10-03 DIAGNOSIS — Z885 Allergy status to narcotic agent status: Secondary | ICD-10-CM | POA: Diagnosis not present

## 2016-10-03 DIAGNOSIS — E870 Hyperosmolality and hypernatremia: Secondary | ICD-10-CM | POA: Diagnosis not present

## 2016-10-03 DIAGNOSIS — Z681 Body mass index (BMI) 19 or less, adult: Secondary | ICD-10-CM | POA: Diagnosis not present

## 2016-10-03 DIAGNOSIS — Z881 Allergy status to other antibiotic agents status: Secondary | ICD-10-CM

## 2016-10-03 DIAGNOSIS — I82621 Acute embolism and thrombosis of deep veins of right upper extremity: Secondary | ICD-10-CM | POA: Diagnosis present

## 2016-10-03 DIAGNOSIS — N179 Acute kidney failure, unspecified: Secondary | ICD-10-CM | POA: Diagnosis present

## 2016-10-03 DIAGNOSIS — Z515 Encounter for palliative care: Secondary | ICD-10-CM | POA: Diagnosis not present

## 2016-10-03 DIAGNOSIS — R001 Bradycardia, unspecified: Secondary | ICD-10-CM | POA: Diagnosis not present

## 2016-10-03 DIAGNOSIS — Z90710 Acquired absence of both cervix and uterus: Secondary | ICD-10-CM

## 2016-10-03 DIAGNOSIS — R609 Edema, unspecified: Secondary | ICD-10-CM

## 2016-10-03 DIAGNOSIS — Z96611 Presence of right artificial shoulder joint: Secondary | ICD-10-CM | POA: Diagnosis present

## 2016-10-03 DIAGNOSIS — M810 Age-related osteoporosis without current pathological fracture: Secondary | ICD-10-CM | POA: Diagnosis present

## 2016-10-03 DIAGNOSIS — E43 Unspecified severe protein-calorie malnutrition: Secondary | ICD-10-CM | POA: Diagnosis present

## 2016-10-03 DIAGNOSIS — E876 Hypokalemia: Secondary | ICD-10-CM | POA: Diagnosis present

## 2016-10-03 DIAGNOSIS — Z880 Allergy status to penicillin: Secondary | ICD-10-CM

## 2016-10-03 DIAGNOSIS — Z9849 Cataract extraction status, unspecified eye: Secondary | ICD-10-CM | POA: Diagnosis not present

## 2016-10-03 DIAGNOSIS — F0390 Unspecified dementia without behavioral disturbance: Secondary | ICD-10-CM | POA: Diagnosis not present

## 2016-10-03 DIAGNOSIS — Z888 Allergy status to other drugs, medicaments and biological substances status: Secondary | ICD-10-CM

## 2016-10-03 DIAGNOSIS — E860 Dehydration: Principal | ICD-10-CM | POA: Diagnosis present

## 2016-10-03 DIAGNOSIS — N320 Bladder-neck obstruction: Secondary | ICD-10-CM | POA: Diagnosis present

## 2016-10-03 DIAGNOSIS — Z808 Family history of malignant neoplasm of other organs or systems: Secondary | ICD-10-CM | POA: Diagnosis not present

## 2016-10-03 DIAGNOSIS — I10 Essential (primary) hypertension: Secondary | ICD-10-CM | POA: Diagnosis present

## 2016-10-03 DIAGNOSIS — Z66 Do not resuscitate: Secondary | ICD-10-CM

## 2016-10-03 DIAGNOSIS — E875 Hyperkalemia: Secondary | ICD-10-CM | POA: Diagnosis present

## 2016-10-03 DIAGNOSIS — Z79899 Other long term (current) drug therapy: Secondary | ICD-10-CM

## 2016-10-03 DIAGNOSIS — N3000 Acute cystitis without hematuria: Secondary | ICD-10-CM | POA: Diagnosis present

## 2016-10-03 DIAGNOSIS — H9190 Unspecified hearing loss, unspecified ear: Secondary | ICD-10-CM | POA: Diagnosis present

## 2016-10-03 DIAGNOSIS — Z1623 Resistance to quinolones and fluoroquinolones: Secondary | ICD-10-CM | POA: Diagnosis present

## 2016-10-03 DIAGNOSIS — Z833 Family history of diabetes mellitus: Secondary | ICD-10-CM | POA: Diagnosis not present

## 2016-10-03 DIAGNOSIS — R627 Adult failure to thrive: Secondary | ICD-10-CM | POA: Diagnosis not present

## 2016-10-03 DIAGNOSIS — Z853 Personal history of malignant neoplasm of breast: Secondary | ICD-10-CM

## 2016-10-03 DIAGNOSIS — M25511 Pain in right shoulder: Secondary | ICD-10-CM

## 2016-10-03 DIAGNOSIS — B9620 Unspecified Escherichia coli [E. coli] as the cause of diseases classified elsewhere: Secondary | ICD-10-CM | POA: Diagnosis present

## 2016-10-03 LAB — URINALYSIS COMPLETE WITH MICROSCOPIC (ARMC ONLY)
Bilirubin Urine: NEGATIVE
Glucose, UA: NEGATIVE mg/dL
Ketones, ur: NEGATIVE mg/dL
Nitrite: POSITIVE — AB
PH: 5 (ref 5.0–8.0)
Protein, ur: 30 mg/dL — AB
Specific Gravity, Urine: 1.014 (ref 1.005–1.030)
Squamous Epithelial / LPF: NONE SEEN

## 2016-10-03 LAB — COMPREHENSIVE METABOLIC PANEL
ALT: 18 U/L (ref 14–54)
AST: 26 U/L (ref 15–41)
Albumin: 3.1 g/dL — ABNORMAL LOW (ref 3.5–5.0)
Alkaline Phosphatase: 102 U/L (ref 38–126)
Anion gap: 6 (ref 5–15)
BUN: 38 mg/dL — AB (ref 6–20)
CHLORIDE: 126 mmol/L — AB (ref 101–111)
CO2: 28 mmol/L (ref 22–32)
CREATININE: 1.54 mg/dL — AB (ref 0.44–1.00)
Calcium: 11.5 mg/dL — ABNORMAL HIGH (ref 8.9–10.3)
GFR calc Af Amer: 35 mL/min — ABNORMAL LOW (ref >60)
GFR calc non Af Amer: 30 mL/min — ABNORMAL LOW (ref >60)
GLUCOSE: 103 mg/dL — AB (ref 65–99)
Potassium: 3.3 mmol/L — ABNORMAL LOW (ref 3.5–5.1)
SODIUM: 160 mmol/L — AB (ref 135–145)
Total Bilirubin: 0.5 mg/dL (ref 0.3–1.2)
Total Protein: 6.5 g/dL (ref 6.5–8.1)

## 2016-10-03 LAB — BASIC METABOLIC PANEL
BUN: 33 mg/dL — AB (ref 4–21)
Creatinine: 1.3 mg/dL — AB (ref 0.5–1.1)
Sodium: 167 mmol/L — AB (ref 137–147)

## 2016-10-03 LAB — CBC WITH DIFFERENTIAL/PLATELET
BASOS ABS: 0.1 10*3/uL (ref 0–0.1)
Basophils Relative: 1 %
EOS ABS: 0.2 10*3/uL (ref 0–0.7)
EOS PCT: 2 %
HCT: 35.4 % (ref 35.0–47.0)
Hemoglobin: 11.4 g/dL — ABNORMAL LOW (ref 12.0–16.0)
LYMPHS PCT: 9 %
Lymphs Abs: 1.2 10*3/uL (ref 1.0–3.6)
MCH: 31.2 pg (ref 26.0–34.0)
MCHC: 32.3 g/dL (ref 32.0–36.0)
MCV: 96.7 fL (ref 80.0–100.0)
Monocytes Absolute: 0.8 10*3/uL (ref 0.2–0.9)
Monocytes Relative: 6 %
Neutro Abs: 10.5 10*3/uL — ABNORMAL HIGH (ref 1.4–6.5)
Neutrophils Relative %: 82 %
PLATELETS: 377 10*3/uL (ref 150–440)
RBC: 3.66 MIL/uL — AB (ref 3.80–5.20)
RDW: 15.4 % — ABNORMAL HIGH (ref 11.5–14.5)
WBC: 12.7 10*3/uL — AB (ref 3.6–11.0)

## 2016-10-03 LAB — CBC AND DIFFERENTIAL
HEMATOCRIT: 37 % (ref 36–46)
HEMOGLOBIN: 11.3 g/dL — AB (ref 12.0–16.0)
Platelets: 377 10*3/uL (ref 150–399)
WBC: 13.6 10^3/mL

## 2016-10-03 MED ORDER — SODIUM CHLORIDE 0.9 % IV BOLUS (SEPSIS)
500.0000 mL | Freq: Once | INTRAVENOUS | Status: AC
Start: 2016-10-03 — End: 2016-10-03
  Administered 2016-10-03: 500 mL via INTRAVENOUS

## 2016-10-03 MED ORDER — DEXTROSE 5 % IV SOLN
Freq: Once | INTRAVENOUS | Status: AC
  Administered 2016-10-03: 21:00:00 via INTRAVENOUS

## 2016-10-03 MED ORDER — CIPROFLOXACIN IN D5W 400 MG/200ML IV SOLN
400.0000 mg | Freq: Once | INTRAVENOUS | Status: AC
Start: 2016-10-03 — End: 2016-10-03
  Administered 2016-10-03: 400 mg via INTRAVENOUS
  Filled 2016-10-03: qty 200

## 2016-10-03 NOTE — ED Provider Notes (Signed)
[facility] Emergency Department Provider Note    First MD Initiated Contact with Patient 10/03/16 Bosie Helper     (approximate)  I have reviewed the triage vital signs and the nursing notes.   HISTORY  Chief Complaint Abnormal Lab  Level V Caveat:  Dementia - nonverbal  HPI Deborah Espinoza is a 82 y.o. female with severe dementia presenting from [facility] home due to abnormal labs with reported sodium of 167. Patient nonverbal with severe dementia at baseline. According to EMS and facility patient is otherwise acting at baseline. No recent fevers reported. Patient is does post recent right shoulder surgery.   Past Medical History:  Diagnosis Date  . Allergic rhinitis, cause unspecified   . Breast cancer ([place])    right  . Cataracts, bilateral    complications from eye surgery  . Dizziness and giddiness   . Edema   . Elevated blood pressure reading without diagnosis of hypertension   . External hemorrhoids without mention of complication   . GERD (gastroesophageal reflux disease)   . Lichen, unspecified    sclerosis  . Memory loss   . Mixed hyperlipidemia   . Osteoporosis, unspecified   . Other constipation   . Personal history of colonic polyps   . Sprain of neck   . Unspecified constipation   . Unspecified essential hypertension   . Unspecified hearing loss   . Urticaria, unspecified   . Varicose veins of lower extremities with other complications    Family History  Problem Relation Age of Onset  . Brain cancer Father   . Diabetes Father   . Kidney cancer Mother     renal cell  . Cancer Mother   . Diabetes Brother   . Diabetes Brother   . Colon cancer Brother   . Alzheimer's disease      Aunt   Past Surgical History:  Procedure Laterality Date  . BILATERAL OOPHORECTOMY     bilateral  . BREAST BIOPSY  12/07   pos ductal carcinoma in situ  . BREAST LUMPECTOMY  2008   RIGHT  . CATARACT EXTRACTION  04/2003  . COLONOSCOPY  11/02   polyp   . COLONOSCOPY  09/2004   polyps  . COLONOSCOPY  11/08   adenomatous polyps  . DEXA  2001   Osteopenia  . DEXA     improved OP  . DEXA  11/2004   OP  . DEXA  2/09   OP T score-3.0 spine and -2.5 in fem neck  . nuclear stress  05/2007   negative  . ovarian tumor  11/04   benign  . PARTIAL HYSTERECTOMY  1987   fibroids  . REVERSE SHOULDER ARTHROPLASTY Right 09/19/2016   Procedure: REVERSE RIGHT TOTAL SHOULDER ARTHROPLASTY;  Surgeon: Corky Mull, MD;  Location: [facility] ORS;  Service: Orthopedics;  Laterality: Right;  . stress perfusion cardiac study  7/22008   Normal with normal EF   Patient Active Problem List   Diagnosis Date Noted  . Hypernatremia 10/03/2016  . Status post reverse total shoulder replacement, right 09/19/2016  . Shoulder fracture 09/13/2016  . Pressure injury of skin 09/13/2016  . Shoulder fracture, right, closed, initial encounter 09/12/2016  . Hyperparathyroidism, primary ([place]) 05/17/2015  . Abnormal TSH 05/08/2014  . Leukocytosis 05/08/2014  . Hypokalemia 11/21/2012  . hx: breast cancer, right, DCIS, receptor + 11/28/2011  . Neuropathy ([place]) 09/27/2011  . NECK SPRAIN AND STRAIN 11/06/2010  . VARICOSE VEINS LOWER EXTREMITIES W/OTH COMPS 05/05/2010  .  HEMORRHOIDS, EXTERNAL 01/06/2010  . Dementia without behavioral disturbance 07/23/2009  . PERIPHERAL EDEMA 04/03/2009  . Essential hypertension 01/22/2009  . DECREASED HEARING 09/02/2008  . CONSTIPATION, CHRONIC 04/21/2008  . HYPERLIPIDEMIA 08/30/2007  . COLONIC POLYPS, HX OF 08/30/2007  . ALLERGIC RHINITIS 08/06/2007  . LICHEN NOS [id number]  . Osteoporosis 08/06/2007      Prior to Admission medications   Medication Sig Start Date End Date Taking? Authorizing Provider  acetaminophen (TYLENOL) 325 MG tablet Take 2 tablets (650 mg total) by mouth every 6 (six) hours as needed for mild pain (or Fever >/= 101). 09/15/16  Yes Demetrios Loll, MD  amLODipine (NORVASC) 5 MG tablet Take 1 tablet (5 mg total) by  mouth daily. 08/08/16  Yes Abner Greenspan, MD  bisacodyl (DULCOLAX) 5 MG EC tablet Take 1 tablet (5 mg total) by mouth daily as needed for moderate constipation. 09/15/16  Yes Demetrios Loll, MD  Cholecalciferol (VITAMIN D PO) Take 1 tablet by mouth daily.   Yes Historical Provider, MD  donepezil (ARICEPT) 10 MG tablet Take 10 mg by mouth at bedtime.   Yes Historical Provider, MD  Multiple Vitamins-Minerals (PRESERVISION AREDS PO) Take 1 tablet by mouth daily.   Yes Historical Provider, MD  oxyCODONE (OXY IR/ROXICODONE) 5 MG immediate release tablet Take 1-2 tablets (5-10 mg total) by mouth every 4 (four) hours as needed for breakthrough pain. 09/20/16  Yes Lattie Corns, PA-C  traMADol (ULTRAM) 50 MG tablet Take 1 tablet (50 mg total) by mouth every 6 (six) hours as needed for moderate pain. 09/20/16  Yes Lattie Corns, PA-C    Allergies Cephalexin; Clarithromycin; Codeine; Hydrocodone; Penicillins; and Risedronate sodium    Social History Social History  Substance Use Topics  . Smoking status: Never Smoker  . Smokeless tobacco: Never Used  . Alcohol use No    Review of Systems Patient denies headaches, rhinorrhea, blurry vision, numbness, shortness of breath, chest pain, edema, cough, abdominal pain, nausea, vomiting, diarrhea, dysuria, fevers, rashes or hallucinations unless otherwise stated above in HPI. ____________________________________________   PHYSICAL EXAM:  VITAL SIGNS: Vitals:   10/03/16 1851  BP: 94/63  Pulse: 75  Resp: 17  Temp: 97.7 F (36.5 C)    Constitutional: Alert , frail, elderly, cachectic Eyes: Conjunctivae are normal. PERRL. Head: Atraumatic. Nose: No congestion/rhinnorhea. Mouth/Throat: Mucous membranes are dry.  Oropharynx non-erythematous. Neck: No stridor. Painless ROM. No cervical spine tenderness to palpation Hematological/Lymphatic/Immunilogical: No cervical lymphadenopathy. Cardiovascular: mildly tachycardic, regular rhythm. Grossly  normal heart sounds.  Good peripheral circulation. Respiratory: Normal respiratory effort.  No retractions. Lungs CTAB. Gastrointestinal: Soft and nontender. No distention. No abdominal bruits. No CVA tenderness. Musculoskeletal: No lower extremity tenderness nor edema.   RUE in sling with surgical incision c/d/i Neuro:  No facail droop.  unabler to cooperate with neuro exam Skin:  Skin is warm, dry and intact. No rash noted. Psychiatric: Mood and affect are normal. Speech and behavior are normal.  ____________________________________________   LABS (all labs ordered are listed, but only abnormal results are displayed)  Results for orders placed or performed during the hospital encounter of 10/03/16 (from the past 24 hour(s))  CBC with Differential/Platelet     Status: Abnormal   Collection Time: 10/03/16  7:24 PM  Result Value Ref Range   WBC 12.7 (H) 3.6 - 11.0 K/uL   RBC 3.66 (L) 3.80 - 5.20 MIL/uL   Hemoglobin 11.4 (L) 12.0 - 16.0 g/dL   HCT 35.4 35.0 - 47.0 %  MCV 96.7 80.0 - 100.0 fL   MCH 31.2 26.0 - 34.0 pg   MCHC 32.3 32.0 - 36.0 g/dL   RDW 15.4 (H) 11.5 - 14.5 %   Platelets 377 150 - 440 K/uL   Neutrophils Relative % 82 %   Neutro Abs 10.5 (H) 1.4 - 6.5 K/uL   Lymphocytes Relative 9 %   Lymphs Abs 1.2 1.0 - 3.6 K/uL   Monocytes Relative 6 %   Monocytes Absolute 0.8 0.2 - 0.9 K/uL   Eosinophils Relative 2 %   Eosinophils Absolute 0.2 0 - 0.7 K/uL   Basophils Relative 1 %   Basophils Absolute 0.1 0 - 0.1 K/uL  Comprehensive metabolic panel     Status: Abnormal   Collection Time: 10/03/16  7:24 PM  Result Value Ref Range   Sodium 160 (H) 135 - 145 mmol/L   Potassium 3.3 (L) 3.5 - 5.1 mmol/L   Chloride 126 (H) 101 - 111 mmol/L   CO2 28 22 - 32 mmol/L   Glucose, Bld 103 (H) 65 - 99 mg/dL   BUN 38 (H) 6 - 20 mg/dL   Creatinine, Ser 1.54 (H) 0.44 - 1.00 mg/dL   Calcium 11.5 (H) 8.9 - 10.3 mg/dL   Total Protein 6.5 6.5 - 8.1 g/dL   Albumin 3.1 (L) 3.5 - 5.0 g/dL    AST 26 15 - 41 U/L   ALT 18 14 - 54 U/L   Alkaline Phosphatase 102 38 - 126 U/L   Total Bilirubin 0.5 0.3 - 1.2 mg/dL   GFR calc non Af Amer 30 (L) >60 mL/min   GFR calc Af Amer 35 (L) >60 mL/min   Anion gap 6 5 - 15  Urinalysis complete, with microscopic ([facility] only)     Status: Abnormal   Collection Time: 10/03/16  7:32 PM  Result Value Ref Range   Color, Urine YELLOW (A) YELLOW   APPearance CLOUDY (A) CLEAR   Glucose, UA NEGATIVE NEGATIVE mg/dL   Bilirubin Urine NEGATIVE NEGATIVE   Ketones, ur NEGATIVE NEGATIVE mg/dL   Specific Gravity, Urine 1.014 1.005 - 1.030   Hgb urine dipstick 2+ (A) NEGATIVE   pH 5.0 5.0 - 8.0   Protein, ur 30 (A) NEGATIVE mg/dL   Nitrite POSITIVE (A) NEGATIVE   Leukocytes, UA 3+ (A) NEGATIVE   RBC / HPF 0-5 0 - 5 RBC/hpf   WBC, UA TOO NUMEROUS TO COUNT 0 - 5 WBC/hpf   Bacteria, UA RARE (A) NONE SEEN   Squamous Epithelial / LPF NONE SEEN NONE SEEN   WBC Clumps PRESENT    Mucous PRESENT    Hyaline Casts, UA PRESENT    ____________________________________________  EKG My review and personal interpretation at Time: 18:42   Indication: medical evaluation  Rate: 80  Rhythm: sinus Axis: normal Other: non specific st changes, no acute ischemia ____________________________________________  RADIOLOGY   ____________________________________________   PROCEDURES  Procedure(s) performed: none Procedures    Critical Care performed: yes CRITICAL CARE Performed by: Merlyn Lot   Total critical care time: 30 minutes  Critical care time was exclusive of separately billable procedures and treating other patients.  Critical care was necessary to treat or prevent imminent or life-threatening deterioration.  Critical care was time spent personally by me on the following activities: development of treatment plan with patient and/or surrogate as well as nursing, discussions with consultants, evaluation of patient's response to treatment, examination  of patient, obtaining history from patient or surrogate, ordering and performing treatments  and interventions, ordering and review of laboratory studies, ordering and review of radiographic studies, pulse oximetry and re-evaluation of patient's condition.  ____________________________________________   INITIAL IMPRESSION / ASSESSMENT AND PLAN / ED COURSE  Pertinent labs & imaging results that were available during my care of the patient were reviewed by me and considered in my medical decision making (see chart for details).  DDX: Dehydration, uti, hypoglycemia,d rug effect, renal failure   Deborah Espinoza is a 82 y.o. who presents to the ED with reported elevated sodium level. Patient afebrile with soft blood pressure but in no acute distress. Does appear markedly dehydrated. We'll repeat blood levels. Patient otherwise reportedly at baseline. I am concerned for possible a KI and renal failure as patient does appear dry and is recently postop. We will order IV fluid resuscitation.  The patient will be placed on continuous pulse oximetry and telemetry for monitoring.  Laboratory evaluation will be sent to evaluate for the above complaints.     Clinical Course as of Oct 03 2108  Mon Oct 03, 2016  2034 Discussed patient's multiple antibiotic allergies with pharmacy. She has no documented history of multiple resistant pathogens, will start patient on IV Cipro. Patient currently receiving IV fluid resuscitation due to her significant dehydration with aKI and free water deficit.Spoke with Dr. Ara Kussmaul who agrees to admit patient for further evaluation and management.  Have discussed with the patient and available family all diagnostics and treatments performed thus far and all questions were answered to the best of my ability. The patient demonstrates understanding and agreement with plan.   [PR]    Clinical Course User Index [PR] Merlyn Lot, MD      ____________________________________________   FINAL CLINICAL IMPRESSION(S) / ED DIAGNOSES  Final diagnoses:  Hypernatremia  AKI (acute kidney injury) ([place])  Acute cystitis without hematuria      NEW MEDICATIONS STARTED DURING THIS VISIT:  New Prescriptions   No medications on file     Note:  This document was prepared using Dragon voice recognition software and may include unintentional dictation errors.    Merlyn Lot, MD 10/03/16 2110

## 2016-10-03 NOTE — ED Triage Notes (Signed)
Pt presents from [facility] via ems after follow up with [facility] today revealed sodium level of 167. Pt is non-verbal at baseline. Se recently had shoulder surgery after a fall and is in a sling. Pt alert. NAD noted.

## 2016-10-03 NOTE — ED Notes (Signed)
Hooked pt up to monitor.

## 2016-10-03 NOTE — H&P (Signed)
[facility] - [place] @ [facility] Admission History and Physical Tonye Royalty, D.O.  ---------------------------------------------------------------------------------------------------------------------   PATIENT NAME: Deborah Espinoza MR#: [id number] DATE OF BIRTH: 1933/01/08 DATE OF ADMISSION: 10/03/2016 PRIMARY CARE PHYSICIAN: Roxy Manns, MD  REQUESTING/REFERRING PHYSICIAN: ED Dr. Roxan Hockey  CHIEF COMPLAINT: Chief Complaint  Patient presents with  . Abnormal Lab    HISTORY OF PRESENT ILLNESS:  Please note the history is obtained from the patient's husband, the chart and the emergency department physician. The history from the patient is severely limited due to dementia.   Deborah Espinoza is a 82 y.o. female with a known history of Hyperlipidemia, breast cancer, GERD, recent right shoulder replacement presents to the emergency department for evaluation of abnormal labs.  Per the patient's husband she fell on November 3 of this year and required a right shoulder replacement which she had 2 weeks ago. Since then she has been living in an assisted living facility for rehabilitation. She reported he had some routine labs which revealed a sodium of 167 for which she was referred to the emergency department. Patient's husband denies any recent changes to her status although he says he cannot speak for her mental status and alertness of late. He also does not know whether she has been eating or drinking or has had a decreased by mouth intake.  Otherwise there has been no change in status. Patient has been taking medication as prescribed and there has been no recent change in medication or diet.  There has been no recent illness, travel or sick contacts.    EMS/ED COURSE:   Patient received Cipro and IV normal saline.Marland Kitchen  PAST MEDICAL HISTORY: Past Medical History:  Diagnosis Date  . Allergic rhinitis, cause unspecified   . Breast cancer (HCC)    right  . Cataracts, bilateral    complications from eye surgery  . Dizziness and giddiness   . Edema   . Elevated blood pressure reading without diagnosis of hypertension   . External hemorrhoids without mention of complication   . GERD (gastroesophageal reflux disease)   . Lichen, unspecified    sclerosis  . Memory loss   . Mixed hyperlipidemia   . Osteoporosis, unspecified   . Other constipation   . Personal history of colonic polyps   . Sprain of neck   . Unspecified constipation   . Unspecified essential hypertension   . Unspecified hearing loss   . Urticaria, unspecified   . Varicose veins of lower extremities with other complications       PAST SURGICAL HISTORY: Past Surgical History:  Procedure Laterality Date  . BILATERAL OOPHORECTOMY     bilateral  . BREAST BIOPSY  12/07   pos ductal carcinoma in situ  . BREAST LUMPECTOMY  2008   RIGHT  . CATARACT EXTRACTION  04/2003  . COLONOSCOPY  11/02   polyp  . COLONOSCOPY  09/2004   polyps  . COLONOSCOPY  11/08   adenomatous polyps  . DEXA  2001   Osteopenia  . DEXA     improved OP  . DEXA  11/2004   OP  . DEXA  2/09   OP T score-3.0 spine and -2.5 in fem neck  . nuclear stress  05/2007   negative  . ovarian tumor  11/04   benign  . PARTIAL HYSTERECTOMY  1987   fibroids  . REVERSE SHOULDER ARTHROPLASTY Right 09/19/2016   Procedure: REVERSE RIGHT TOTAL SHOULDER ARTHROPLASTY;  Surgeon: Christena Flake, MD;  Location: [facility] ORS;  Service: Orthopedics;  Laterality: Right;  . stress perfusion cardiac study  7/22008   Normal with normal EF      SOCIAL HISTORY: Social History  Substance Use Topics  . Smoking status: Never Smoker  . Smokeless tobacco: Never Used  . Alcohol use No      FAMILY HISTORY: Family History  Problem Relation Age of Onset  . Brain cancer Father   . Diabetes Father   . Kidney cancer Mother     renal cell  . Cancer Mother   . Diabetes Brother   . Diabetes Brother   . Colon cancer Brother   . Alzheimer's disease       Aunt     MEDICATIONS AT HOME: Prior to Admission medications   Medication Sig Start Date End Date Taking? Authorizing Provider  acetaminophen (TYLENOL) 325 MG tablet Take 2 tablets (650 mg total) by mouth every 6 (six) hours as needed for mild pain (or Fever >/= 101). 09/15/16  Yes Shaune Pollack, MD  amLODipine (NORVASC) 5 MG tablet Take 1 tablet (5 mg total) by mouth daily. 08/08/16  Yes Judy Pimple, MD  bisacodyl (DULCOLAX) 5 MG EC tablet Take 1 tablet (5 mg total) by mouth daily as needed for moderate constipation. 09/15/16  Yes Shaune Pollack, MD  Cholecalciferol (VITAMIN D PO) Take 1 tablet by mouth daily.   Yes Historical Provider, MD  donepezil (ARICEPT) 10 MG tablet Take 10 mg by mouth at bedtime.   Yes Historical Provider, MD  Multiple Vitamins-Minerals (PRESERVISION AREDS PO) Take 1 tablet by mouth daily.   Yes Historical Provider, MD  oxyCODONE (OXY IR/ROXICODONE) 5 MG immediate release tablet Take 1-2 tablets (5-10 mg total) by mouth every 4 (four) hours as needed for breakthrough pain. 09/20/16  Yes Anson Oregon, PA-C  traMADol (ULTRAM) 50 MG tablet Take 1 tablet (50 mg total) by mouth every 6 (six) hours as needed for moderate pain. 09/20/16  Yes Anson Oregon, PA-C      DRUG ALLERGIES: Allergies  Allergen Reactions  . Cephalexin   . Clarithromycin   . Codeine   . Hydrocodone   . Penicillins   . Risedronate Sodium      REVIEW OF SYSTEMS: Unable to obtain secondary to dementia  PHYSICAL EXAMINATION: VITAL SIGNS: Blood pressure (!) 166/68, pulse 74, temperature 97.7 F (36.5 C), temperature source Oral, resp. rate 20, height 5' (1.524 m), weight 42.6 kg (94 lb), SpO2 100 %.  GENERAL: 82 y.o.-year-old pale white female patient, well-developed, well-nourished lying in the bed in no acute distress.  Patient is awake but does not converse or follow commands. HEENT: Head atraumatic, normocephalic. Pupils equal, round, reactive to light and accommodation. No scleral  icterus. Extraocular muscles intact. Oropharynx is clear. Mucus membranes are very dry. NECK: Supple, full range of motion. No JVD, no bruit heard. No cervical lymphadenopathy. CHEST: Normal breath sounds bilaterally. No wheezing, rales, rhonchi or crackles. No use of accessory muscles of respiration.  No reproducible chest wall tenderness.  CARDIOVASCULAR: S1, S2 normal. No murmurs, rubs, or gallops appreciated. Cap refill <2 seconds. ABDOMEN: Soft, nontender, nondistended. No rebound, guarding, rigidity. Normoactive bowel sounds present in all four quadrants. No organomegaly or mass. EXTREMITIES:  No pedal edema, cyanosis, or clubbing. Right upper extremity is in a sling. NEUROLOGIC: Patient is awake but unable to comply with neuro exam. SKIN: Warm, dry, and intact without obvious rash, lesion, or ulcer.  LABORATORY PANEL:  CBC  Recent Labs Lab 10/03/16 1924  WBC 12.7*  HGB 11.4*  HCT 35.4  PLT 377   ----------------------------------------------------------------------------------------------------------------- Chemistries  Recent Labs Lab 10/03/16 1924  NA 160*  K 3.3*  CL 126*  CO2 28  GLUCOSE 103*  BUN 38*  CREATININE 1.54*  CALCIUM 11.5*  AST 26  ALT 18  ALKPHOS 102  BILITOT 0.5   ------------------------------------------------------------------------------------------------------------------ Cardiac Enzymes No results for input(s): TROPONINI in the last 168 hours. ------------------------------------------------------------------------------------------------------------------  RADIOLOGY: No results found.  EKG: Normal sinus rhythm at 80 bpm with normal axis and nonspecific ST-T wave changes.   IMPRESSION AND PLAN:  This is a 82 y.o. female with a history of Hyperlipidemia, breast cancer, GERD, recent right shoulder replacement now being admitted with: 1. Acute kidney injury and hypernatremia secondary to dehydration. We'll admit for IV fluid hydration  and recheck BMP in the a.m. Patient has already received 1 L of normal saline we will switch to D5 half normal saline with potassium. 2. Hypokalemia, mild-we'll replace by mouth and IV 3. Urinary tract infection-continue IV Cipro and follow up urine cultures 4. History of hypertension-continue Norvasc 5. History of dementia-continue Aricept 6. History of recent right shoulder replacement-continue oxycodone and Ultram as needed. Physical therapy evaluation to prevent deconditioning owing hospitalization.   Diet/Nutrition: Heart healthy Fluids: D5 half-normal saline with potassium DVT Px: Lovenox, SCDs and early ambulation Code Status: Full. This is confirmed with the patient's husband who is at the bedside and act as her healthcare proxy.  All the records are reviewed and case discussed with ED provider. Management plans discussed with the patient and/or family who express understanding and agree with plan of care.   TOTAL TIME TAKING CARE OF THIS PATIENT: 60 minutes.   Deborah Espinoza D.O. on 10/03/2016 at 9:47 PM Between 7am to 6pm - Pager - [phone] After 6pm go to [email or web address] - Social research officer, government [facility] [place] Office [phone] CC: Primary care physician; Roxy Manns, MD     Note: This dictation was prepared with Dragon dictation along with smaller phrase technology. Any transcriptional errors that result from this process are unintentional.

## 2016-10-04 ENCOUNTER — Inpatient Hospital Stay: Payer: Medicare Other

## 2016-10-04 LAB — CBC
HEMATOCRIT: 31.1 % — AB (ref 35.0–47.0)
HEMOGLOBIN: 10.3 g/dL — AB (ref 12.0–16.0)
MCH: 31.5 pg (ref 26.0–34.0)
MCHC: 33 g/dL (ref 32.0–36.0)
MCV: 95.6 fL (ref 80.0–100.0)
Platelets: 311 10*3/uL (ref 150–440)
RBC: 3.26 MIL/uL — AB (ref 3.80–5.20)
RDW: 15.4 % — ABNORMAL HIGH (ref 11.5–14.5)
WBC: 10.4 10*3/uL (ref 3.6–11.0)

## 2016-10-04 LAB — BASIC METABOLIC PANEL
ANION GAP: 2 — AB (ref 5–15)
BUN: 33 mg/dL — ABNORMAL HIGH (ref 6–20)
CALCIUM: 10.9 mg/dL — AB (ref 8.9–10.3)
CO2: 29 mmol/L (ref 22–32)
Chloride: 126 mmol/L — ABNORMAL HIGH (ref 101–111)
Creatinine, Ser: 1.25 mg/dL — ABNORMAL HIGH (ref 0.44–1.00)
GFR, EST AFRICAN AMERICAN: 45 mL/min — AB (ref >60)
GFR, EST NON AFRICAN AMERICAN: 39 mL/min — AB (ref >60)
Glucose, Bld: 127 mg/dL — ABNORMAL HIGH (ref 65–99)
Potassium: 3.1 mmol/L — ABNORMAL LOW (ref 3.5–5.1)
Sodium: 157 mmol/L — ABNORMAL HIGH (ref 135–145)

## 2016-10-04 LAB — MRSA PCR SCREENING: MRSA by PCR: NEGATIVE

## 2016-10-04 LAB — MAGNESIUM: MAGNESIUM: 2.4 mg/dL (ref 1.7–2.4)

## 2016-10-04 LAB — PHOSPHORUS: PHOSPHORUS: 2.5 mg/dL (ref 2.5–4.6)

## 2016-10-04 MED ORDER — SENNOSIDES-DOCUSATE SODIUM 8.6-50 MG PO TABS: 1.0000 | ORAL_TABLET | Freq: Every evening | ORAL | Status: DC | PRN

## 2016-10-04 MED ORDER — MAGNESIUM CITRATE PO SOLN
1.0000 | Freq: Once | ORAL | Status: DC | PRN
  Filled 2016-10-04: qty 296

## 2016-10-04 MED ORDER — CIPROFLOXACIN IN D5W 400 MG/200ML IV SOLN
400.0000 mg | INTRAVENOUS | Status: DC
  Administered 2016-10-04 – 2016-10-05 (×2): 400 mg via INTRAVENOUS
  Filled 2016-10-04 (×3): qty 200

## 2016-10-04 MED ORDER — VITAMIN D 1000 UNITS PO TABS
1000.0000 [IU] | ORAL_TABLET | Freq: Every day | ORAL | Status: DC
Start: 2016-10-04 — End: 2016-10-07
  Administered 2016-10-04 – 2016-10-07 (×4): 1000 [IU] via ORAL
  Filled 2016-10-04 (×4): qty 1

## 2016-10-04 MED ORDER — ACETAMINOPHEN 650 MG RE SUPP: 650.0000 mg | Freq: Four times a day (QID) | RECTAL | Status: DC | PRN

## 2016-10-04 MED ORDER — ONDANSETRON HCL 4 MG PO TABS: 4.0000 mg | ORAL_TABLET | Freq: Four times a day (QID) | ORAL | Status: DC | PRN

## 2016-10-04 MED ORDER — ACETAMINOPHEN 325 MG PO TABS: 650.0000 mg | ORAL_TABLET | Freq: Four times a day (QID) | ORAL | Status: DC | PRN

## 2016-10-04 MED ORDER — ENSURE ENLIVE PO LIQD: 237.0000 mL | Freq: Two times a day (BID) | ORAL | Status: DC

## 2016-10-04 MED ORDER — AMLODIPINE BESYLATE 5 MG PO TABS
5.0000 mg | ORAL_TABLET | Freq: Every day | ORAL | Status: DC
  Administered 2016-10-04 – 2016-10-07 (×4): 5 mg via ORAL
  Filled 2016-10-04 (×4): qty 1

## 2016-10-04 MED ORDER — POTASSIUM CHLORIDE 20 MEQ/15ML (10%) PO SOLN
40.0000 meq | Freq: Once | ORAL | Status: AC
  Administered 2016-10-04: 40 meq via ORAL
  Filled 2016-10-04: qty 30

## 2016-10-04 MED ORDER — ENOXAPARIN SODIUM 40 MG/0.4ML ~~LOC~~ SOLN: 40.0000 mg | SUBCUTANEOUS | Status: DC

## 2016-10-04 MED ORDER — ENOXAPARIN SODIUM 30 MG/0.3ML ~~LOC~~ SOLN: 30.0000 mg | SUBCUTANEOUS | Status: DC

## 2016-10-04 MED ORDER — SODIUM CHLORIDE 0.9% FLUSH
3.0000 mL | Freq: Two times a day (BID) | INTRAVENOUS | Status: DC
  Administered 2016-10-04 – 2016-10-07 (×4): 3 mL via INTRAVENOUS

## 2016-10-04 MED ORDER — BISACODYL 5 MG PO TBEC: 5.0000 mg | DELAYED_RELEASE_TABLET | Freq: Every day | ORAL | Status: DC | PRN

## 2016-10-04 MED ORDER — ONDANSETRON HCL 4 MG/2ML IJ SOLN: 4.0000 mg | Freq: Four times a day (QID) | INTRAMUSCULAR | Status: DC | PRN

## 2016-10-04 MED ORDER — TRAMADOL HCL 50 MG PO TABS
50.0000 mg | ORAL_TABLET | Freq: Four times a day (QID) | ORAL | Status: DC | PRN
  Administered 2016-10-04 (×2): 50 mg via ORAL
  Filled 2016-10-04 (×2): qty 1

## 2016-10-04 MED ORDER — ZOLPIDEM TARTRATE 5 MG PO TABS: 5.0000 mg | ORAL_TABLET | Freq: Every evening | ORAL | Status: DC | PRN

## 2016-10-04 MED ORDER — ENSURE ENLIVE PO LIQD
237.0000 mL | Freq: Three times a day (TID) | ORAL | Status: DC
  Administered 2016-10-04 – 2016-10-06 (×4): 237 mL via ORAL

## 2016-10-04 MED ORDER — KCL IN DEXTROSE-NACL 20-5-0.45 MEQ/L-%-% IV SOLN
INTRAVENOUS | Status: DC
  Administered 2016-10-03 – 2016-10-04 (×3): via INTRAVENOUS
  Filled 2016-10-04 (×2): qty 1000

## 2016-10-04 MED ORDER — POTASSIUM CHLORIDE 2 MEQ/ML IV SOLN
INTRAVENOUS | Status: DC
  Administered 2016-10-04 – 2016-10-05 (×2): via INTRAVENOUS
  Filled 2016-10-04 (×3): qty 1000

## 2016-10-04 MED ORDER — HEPARIN SODIUM (PORCINE) 5000 UNIT/ML IJ SOLN
5000.0000 [IU] | Freq: Three times a day (TID) | INTRAMUSCULAR | Status: DC
  Administered 2016-10-04 – 2016-10-05 (×3): 5000 [IU] via SUBCUTANEOUS
  Filled 2016-10-04 (×3): qty 1

## 2016-10-04 MED ORDER — DONEPEZIL HCL 5 MG PO TABS
10.0000 mg | ORAL_TABLET | Freq: Every day | ORAL | Status: DC
  Administered 2016-10-04 – 2016-10-06 (×3): 10 mg via ORAL
  Filled 2016-10-04 (×3): qty 2

## 2016-10-04 MED ORDER — POTASSIUM CHLORIDE CRYS ER 20 MEQ PO TBCR
40.0000 meq | EXTENDED_RELEASE_TABLET | Freq: Once | ORAL | Status: AC
  Administered 2016-10-04: 40 meq via ORAL
  Filled 2016-10-04: qty 2

## 2016-10-04 MED ORDER — OXYCODONE HCL 5 MG PO TABS
5.0000 mg | ORAL_TABLET | ORAL | Status: DC | PRN
  Administered 2016-10-05: 10 mg via ORAL
  Administered 2016-10-06: 01:00:00 5 mg via ORAL
  Filled 2016-10-04: qty 1
  Filled 2016-10-04: qty 2

## 2016-10-04 NOTE — Evaluation (Signed)
Clinical/Bedside Swallow Evaluation Patient Details  Name: Deborah Espinoza MRN: [id number] Date of Birth: 30-Jan-1933  Today's Date: 10/04/2016 Time: SLP Start Time (ACUTE ONLY): 1145 SLP Stop Time (ACUTE ONLY): 1245 SLP Time Calculation (min) (ACUTE ONLY): 60 min  Past Medical History:  Past Medical History:  Diagnosis Date  . Allergic rhinitis, cause unspecified   . Breast cancer (HCC)    right  . Cataracts, bilateral    complications from eye surgery  . Dizziness and giddiness   . Edema   . Elevated blood pressure reading without diagnosis of hypertension   . External hemorrhoids without mention of complication   . GERD (gastroesophageal reflux disease)   . Lichen, unspecified    sclerosis  . Memory loss   . Mixed hyperlipidemia   . Osteoporosis, unspecified   . Other constipation   . Personal history of colonic polyps   . Sprain of neck   . Unspecified constipation   . Unspecified essential hypertension   . Unspecified hearing loss   . Urticaria, unspecified   . Varicose veins of lower extremities with other complications    Past Surgical History:  Past Surgical History:  Procedure Laterality Date  . BILATERAL OOPHORECTOMY     bilateral  . BREAST BIOPSY  12/07   pos ductal carcinoma in situ  . BREAST LUMPECTOMY  2008   RIGHT  . CATARACT EXTRACTION  04/2003  . COLONOSCOPY  11/02   polyp  . COLONOSCOPY  09/2004   polyps  . COLONOSCOPY  11/08   adenomatous polyps  . DEXA  2001   Osteopenia  . DEXA     improved OP  . DEXA  11/2004   OP  . DEXA  2/09   OP T score-3.0 spine and -2.5 in fem neck  . nuclear stress  05/2007   negative  . ovarian tumor  11/04   benign  . PARTIAL HYSTERECTOMY  1987   fibroids  . REVERSE SHOULDER ARTHROPLASTY Right 09/19/2016   Procedure: REVERSE RIGHT TOTAL SHOULDER ARTHROPLASTY;  Surgeon: Christena Flake, MD;  Location: [facility] ORS;  Service: Orthopedics;  Laterality: Right;  . stress perfusion cardiac study  7/22008   Normal with  normal EF   HPI:  Pt 82 year old female patient with dxs of dementia, hypertension, hyperlipidemia had a reverse the right shoulder arthroplasty on November 13. Pt has a baseline of moderate+ Dementia which pt's husband/family indicated had worsened since that admission and the shoulder surgery. Since the last admission, she has been living in an assisted living facility for rehabilitation. She had routine labs done which revealed a sodium of 167 for which she was referred to the emergency department. Patient's husband denies any recent changes to her status although he says he cannot speak for her mental status and alertness of late. He also does not know whether she has been eating or drinking or has had a decreased by mouth intake. Pt does require full assistance for feeding at meals; encouragement w/ oral intake. She is dependent for all ADLs.    Assessment / Plan / Recommendation Clinical Impression  Pt appears at min increased risk for aspiration d/t presenting Cognitive decline/baseline Dementia and overall weakness w/ challenged positioning d/t recent shoulder surgery. Pt does experience some pain/discomfort from the shoulder surgery. Pt's oral phase c/b increased mastication and oral clearing time; appeared to be aided when solids were alternated w/ purees(foods). Noted delayed throat clearing x1 w/ trials of thin liquids; single trials and small amounts appeared  helpful. Pt requires full feeding assistance and strict aspiration precautions. Recommend a Dysphagia 2 w/ added purees, thin liquids diet w/ strict aspiration precautions; monitoring for toleration and need for downgrade of diet if indicated. Recommend medications in puree - crushed.     Aspiration Risk  Mild aspiration risk (Cognitive status; positioning)    Diet Recommendation  Dysphagia level 2 w/ thin liquids - strict aspiration precautions; feeding support at meals  Medication Administration: Crushed with puree    Other   Recommendations Recommended Consults:  (Dietician) Oral Care Recommendations: Oral care BID;Staff/trained caregiver to provide oral care   Follow up Recommendations None (TBD)      Frequency and Duration min 2x/week  1 week       Prognosis Prognosis for Safe Diet Advancement: Fair Barriers to Reach Goals: Cognitive deficits;Severity of deficits      Swallow Study   General Date of Onset: 10/03/16 HPI: Pt 82 year old female patient with dxs of dementia, hypertension, hyperlipidemia had a reverse the right shoulder arthroplasty on November 13. Pt has a baseline of moderate+ Dementia which pt's husband/family indicated had worsened since that admission and the shoulder surgery. Since the last admission, she has been living in an assisted living facility for rehabilitation. She had routine labs done which revealed a sodium of 167 for which she was referred to the emergency department. Patient's husband denies any recent changes to her status although he says he cannot speak for her mental status and alertness of late. He also does not know whether she has been eating or drinking or has had a decreased by mouth intake. Pt does require full assistance for feeding at meals; encouragement w/ oral intake. She is dependent for all ADLs.  Type of Study: Bedside Swallow Evaluation Previous Swallow Assessment: 09/22/16 Diet Prior to this Study:  (unknown; but recommended a Dysphagia 1 w/ thins) Temperature Spikes Noted: No (wbc 10.4) Respiratory Status: Room air History of Recent Intubation: No Behavior/Cognition: Cooperative;Pleasant mood;Confused;Distractible;Requires cueing Oral Cavity Assessment: Within Functional Limits Oral Care Completed by SLP: Recent completion by staff Oral Cavity - Dentition: Adequate natural dentition;Missing dentition Vision:  (n/a) Self-Feeding Abilities: Needs assist;Needs set up;Total assist (will hold cup when drinking w/ assist) Patient Positioning: Upright in  bed;Postural control adequate for testing Baseline Vocal Quality: Low vocal intensity (mumbled) Volitional Cough: Cognitively unable to elicit Volitional Swallow: Unable to elicit    Oral/Motor/Sensory Function Overall Oral Motor/Sensory Function:  (unable to fully assess d/t Cognitive status)   Ice Chips Ice chips: Not tested   Thin Liquid Thin Liquid: Impaired Presentation: Straw (assisted w/ drinking; 10+ trials during meal) Oral Phase Impairments:  (reduced awareness when drinking via straw) Oral Phase Functional Implications:  (difficulty coordinating placing straw b/t teeth x2) Pharyngeal  Phase Impairments: Throat Clearing - Delayed (x1 w/ multiple swallows)    Nectar Thick Nectar Thick Liquid: Not tested   Honey Thick Honey Thick Liquid: Not tested   Puree Puree: Within functional limits (grossly) Presentation: Spoon (fed; 8+ trials) Pharyngeal Phase Impairments:  (none)   Solid   GO   Solid: Impaired (level 2 - well chopped) Presentation: Spoon (fed; 8-9 trials) Oral Phase Impairments: Reduced lingual movement/coordination;Poor awareness of bolus;Impaired mastication (min+ - increased time for mastication and clearing) Oral Phase Functional Implications: Prolonged oral transit (prolonged time for clearing fully) Pharyngeal Phase Impairments:  (none) Other Comments: pt has tended to pocket foods at times per report        Deborah Som, MS, CCC-SLP  Deborah Espinoza 10/04/2016,3:47  PM

## 2016-10-04 NOTE — Consult Note (Signed)
ORTHOPAEDIC CONSULTATION  REQUESTING PHYSICIAN: Bettey Costa, MD  Chief Complaint:   Status post reverse right total shoulder arthroplasty.  History of Present Illness: Deborah Espinoza is a 82 y.o. female with multiple medical problems, including progressive dementia, who underwent a reverse right total shoulder arthroplasty for a badly displaced right proximal humerus fracture on 09/19/16. The patient was seen in our office for routine follow-up on 10/03/16, at which time her staples were removed. She was returned to the rehabilitation facility but later that evening was brought to the emergency room as the results of some routine blood work came back and were notable for a sodium level of 167. Subsequent, the patient was admitted for treatment of her hyponatremia and dehydration. From an orthopedic standpoint, the patient has done well postoperatively in that her wound appears to be healing well and she appears to be much more comfortable than she was prior to her surgery. However, she has fairly severe dementia and is unable to participate in her tolerate any aggressive therapy or rehabilitation.   Past Medical History:  Diagnosis Date  . Allergic rhinitis, cause unspecified   . Breast cancer ([place])    right  . Cataracts, bilateral    complications from eye surgery  . Dizziness and giddiness   . Edema   . Elevated blood pressure reading without diagnosis of hypertension   . External hemorrhoids without mention of complication   . GERD (gastroesophageal reflux disease)   . Lichen, unspecified    sclerosis  . Memory loss   . Mixed hyperlipidemia   . Osteoporosis, unspecified   . Other constipation   . Personal history of colonic polyps   . Sprain of neck   . Unspecified constipation   . Unspecified essential hypertension   . Unspecified hearing loss   . Urticaria, unspecified   . Varicose veins of lower extremities with  other complications    Past Surgical History:  Procedure Laterality Date  . BILATERAL OOPHORECTOMY     bilateral  . BREAST BIOPSY  12/07   pos ductal carcinoma in situ  . BREAST LUMPECTOMY  2008   RIGHT  . CATARACT EXTRACTION  04/2003  . COLONOSCOPY  11/02   polyp  . COLONOSCOPY  09/2004   polyps  . COLONOSCOPY  11/08   adenomatous polyps  . DEXA  2001   Osteopenia  . DEXA     improved OP  . DEXA  11/2004   OP  . DEXA  2/09   OP T score-3.0 spine and -2.5 in fem neck  . nuclear stress  05/2007   negative  . ovarian tumor  11/04   benign  . PARTIAL HYSTERECTOMY  1987   fibroids  . REVERSE SHOULDER ARTHROPLASTY Right 09/19/2016   Procedure: REVERSE RIGHT TOTAL SHOULDER ARTHROPLASTY;  Surgeon: Corky Mull, MD;  Location: [facility] ORS;  Service: Orthopedics;  Laterality: Right;  . stress perfusion cardiac study  7/22008   Normal with normal EF   Social History   Social History  . Marital status: Married    Spouse name: N/A  . Number of children: N/A  . Years of education: N/A   Social History Main Topics  . Smoking status: Never Smoker  . Smokeless tobacco: Never Used  . Alcohol use No  . Drug use: No  . Sexual activity: Not Asked   Other Topics Concern  . None   Social History Narrative  . None   Family History  Problem Relation Age of  Onset  . Brain cancer Father   . Diabetes Father   . Kidney cancer Mother     renal cell  . Cancer Mother   . Diabetes Brother   . Diabetes Brother   . Colon cancer Brother   . Alzheimer's disease      Aunt   Allergies  Allergen Reactions  . Cephalexin   . Clarithromycin   . Codeine   . Hydrocodone   . Penicillins   . Risedronate Sodium    Prior to Admission medications   Medication Sig Start Date End Date Taking? Authorizing Provider  acetaminophen (TYLENOL) 325 MG tablet Take 2 tablets (650 mg total) by mouth every 6 (six) hours as needed for mild pain (or Fever >/= 101). 09/15/16  Yes Demetrios Loll, MD  amLODipine  (NORVASC) 5 MG tablet Take 1 tablet (5 mg total) by mouth daily. 08/08/16  Yes Abner Greenspan, MD  bisacodyl (DULCOLAX) 5 MG EC tablet Take 1 tablet (5 mg total) by mouth daily as needed for moderate constipation. 09/15/16  Yes Demetrios Loll, MD  Cholecalciferol (VITAMIN D PO) Take 1 tablet by mouth daily.   Yes Historical Provider, MD  donepezil (ARICEPT) 10 MG tablet Take 10 mg by mouth at bedtime.   Yes Historical Provider, MD  Multiple Vitamins-Minerals (PRESERVISION AREDS PO) Take 1 tablet by mouth daily.   Yes Historical Provider, MD  oxyCODONE (OXY IR/ROXICODONE) 5 MG immediate release tablet Take 1-2 tablets (5-10 mg total) by mouth every 4 (four) hours as needed for breakthrough pain. 09/20/16  Yes Lattie Corns, PA-C  traMADol (ULTRAM) 50 MG tablet Take 1 tablet (50 mg total) by mouth every 6 (six) hours as needed for moderate pain. 09/20/16  Yes Lattie Corns, PA-C   No results found.  Positive ROS: All other systems have been reviewed and were otherwise negative with the exception of those mentioned in the HPI and as above.  Physical Exam: General:  Alert, no acute distress Psychiatric:  Patient has dementia   Cardiovascular:  No pedal edema Respiratory:  No wheezing, non-labored breathing GI:  Abdomen is soft and non-tender Skin:  No lesions in the area of chief complaint Neurologic:  Sensation intact distally Lymphatic:  No axillary or cervical lymphadenopathy  Orthopedic Exam:  Orthopedic examination is limited to the right shoulder and upper extremity. The patient's surgical incision appears to be well-healed without evidence for infection. There are several small areas of resolving ecchymosis around the shoulder and upper arm, as well as some mild residual swelling. She does not voluntarily move her right hand or upper extremity which is in a shoulder immobilizer. However, she has good capillary refill to all digits and a 1-2+ radial pulse. Her sensation appears to be  intact to light touch to all aspects of the right upper extremities and hand.  X-rays:  No new x-rays of the shoulder are available for review.  Assessment: Status post reverse right total shoulder arthroplasty for badly displaced proximal humerus fracture.  Plan: At this point, the patient's shoulder wound appears to be healing well. She is not a strong rehabilitation candidate, but it is certainly reasonable to initiate some physical occupational therapy while she is here. These orders have been placed already. She was seen in the office yesterday for staple removal and is scheduled to return to our office for follow-up in another month or so. I would request that she keep this appointment.  Thank you for asking up despite in the care  of this most unfortunate woman. Please contact me if you require any further orthopedic assistance in her care.   Pascal Lux, MD  Beeper #:  [phone]  10/04/2016 4:36 PM

## 2016-10-04 NOTE — NC FL2 (Signed)
[place] LEVEL OF CARE SCREENING TOOL     IDENTIFICATION  Patient Name: Deborah Espinoza Birthdate: August 12, 1933 Sex: female Admission Date (Current Location): 10/03/2016  [place] and [place] Number:  Engineering geologist and Address:  [facility], [address] [place], [place] [address]      Provider Number: [id number]  Attending Physician Name and Address:  Bettey Costa, MD  Relative Name and Phone Number:       Current Level of Care: SNF Recommended Level of Care: [place] Prior Approval Number:    Date Approved/Denied:   PASRR Number: [id number]  Discharge Plan: SNF    Current Diagnoses: Patient Active Problem List   Diagnosis Date Noted  . Hypernatremia 10/03/2016  . Status post reverse total shoulder replacement, right 09/19/2016  . Shoulder fracture 09/13/2016  . Pressure injury of skin 09/13/2016  . Shoulder fracture, right, closed, initial encounter 09/12/2016  . Hyperparathyroidism, primary ([place]) 05/17/2015  . Abnormal TSH 05/08/2014  . Leukocytosis 05/08/2014  . Hypokalemia 11/21/2012  . hx: breast cancer, right, DCIS, receptor + 11/28/2011  . Neuropathy ([place]) 09/27/2011  . NECK SPRAIN AND STRAIN 11/06/2010  . VARICOSE VEINS LOWER EXTREMITIES W/OTH COMPS 05/05/2010  . HEMORRHOIDS, EXTERNAL 01/06/2010  . Dementia without behavioral disturbance 07/23/2009  . PERIPHERAL EDEMA 04/03/2009  . Essential hypertension 01/22/2009  . DECREASED HEARING 09/02/2008  . CONSTIPATION, CHRONIC 04/21/2008  . HYPERLIPIDEMIA 08/30/2007  . COLONIC POLYPS, HX OF 08/30/2007  . ALLERGIC RHINITIS 08/06/2007  . LICHEN NOS [id number]  . Osteoporosis 08/06/2007    Orientation RESPIRATION BLADDER Height & Weight     Self  Normal Incontinent Weight: 94 lb (42.6 kg) Height:  5' (152.4 cm)  BEHAVIORAL SYMPTOMS/MOOD NEUROLOGICAL BOWEL NUTRITION STATUS   stable   Incontinent Diet (Heart Healthy)  AMBULATORY STATUS  COMMUNICATION OF NEEDS Skin   Extensive Assist Non-Verbally Surgical wounds                       Personal Care Assistance Level of Assistance  Bathing, Feeding, Dressing Bathing Assistance: Limited assistance Feeding assistance: Limited assistance Dressing Assistance: Limited assistance     Functional Limitations Info  Sight, Hearing, Speech Sight Info: Adequate Hearing Info: Adequate Speech Info: Impaired    SPECIAL CARE FACTORS FREQUENCY  PT (By licensed PT), OT (By licensed OT)                    Contractures Contractures Info: Not present    Additional Factors Info  Code Status, Allergies Code Status Info: Full Code Allergies Info: Cephalexin, Clarithromycin, Codeine, Hydrocodone, Penicillins, Risedronate Sodium           Current Medications (10/04/2016):  This is the current hospital active medication list Current Facility-Administered Medications  Medication Dose Route Frequency Provider Last Rate Last Dose  . acetaminophen (TYLENOL) tablet 650 mg  650 mg Oral Q6H PRN Alexis Hugelmeyer, DO       Or  . acetaminophen (TYLENOL) suppository 650 mg  650 mg Rectal Q6H PRN Alexis Hugelmeyer, DO      . amLODipine (NORVASC) tablet 5 mg  5 mg Oral Daily Alexis Hugelmeyer, DO   5 mg at 10/04/16 0944  . bisacodyl (DULCOLAX) EC tablet 5 mg  5 mg Oral Daily PRN Alexis Hugelmeyer, DO      . cholecalciferol (VITAMIN D) tablet 1,000 Units  1,000 Units Oral Daily Alexis Hugelmeyer, DO   1,000 Units at 10/04/16 0944  .  ciprofloxacin (CIPRO) IVPB 400 mg  400 mg Intravenous Q24H Alexis Hugelmeyer, DO      . dextrose 5 % and 0.45 % NaCl with KCl 20 mEq/L infusion   Intravenous Continuous Alexis Hugelmeyer, DO 75 mL/hr at 10/04/16 0322    . donepezil (ARICEPT) tablet 10 mg  10 mg Oral QHS Alexis Hugelmeyer, DO      . enoxaparin (LOVENOX) injection 30 mg  30 mg Subcutaneous Q24H Alexis Hugelmeyer, DO      . magnesium citrate solution 1 Bottle  1 Bottle Oral Once PRN Alexis  Hugelmeyer, DO      . ondansetron (ZOFRAN) tablet 4 mg  4 mg Oral Q6H PRN Alexis Hugelmeyer, DO       Or  . ondansetron (ZOFRAN) injection 4 mg  4 mg Intravenous Q6H PRN Alexis Hugelmeyer, DO      . oxyCODONE (Oxy IR/ROXICODONE) immediate release tablet 5-10 mg  5-10 mg Oral Q4H PRN Alexis Hugelmeyer, DO      . senna-docusate (Senokot-S) tablet 1 tablet  1 tablet Oral QHS PRN Alexis Hugelmeyer, DO      . sodium chloride flush (NS) 0.9 % injection 3 mL  3 mL Intravenous Q12H Alexis Hugelmeyer, DO   3 mL at 10/04/16 0324  . traMADol (ULTRAM) tablet 50 mg  50 mg Oral Q6H PRN Alexis Hugelmeyer, DO   50 mg at 10/04/16 0957  . zolpidem (AMBIEN) tablet 5 mg  5 mg Oral QHS PRN Alexis Hugelmeyer, DO         Discharge Medications: Please see discharge summary for a list of discharge medications.  Relevant Imaging Results:  Relevant Lab Results:   Additional Information SSN:  [id number]  Continue palliative following at facility  [place], Evie Lacks, LCSW

## 2016-10-04 NOTE — Clinical Social Work Note (Signed)
Clinical Social Work Assessment  Patient Details  Name: Deborah Espinoza MRN: [id number] Date of Birth: [birth year]  Date of referral:  10/04/16               Reason for consult:  Discharge Planning (Admitted from [facility]                Permission sought to share information with:  Case Manager, Customer service manager, Family Supports Permission granted to share information::  Yes, Verbal Permission Granted  Name::        Agency::  [facility]:  SNF  Relationship::  Husband  Contact Information:     Housing/Transportation Living arrangements for the past 2 months:  [place], [place] of Information:  Medical Team, Case Manager, Facility, Spouse Patient Interpreter Needed:  None Criminal Activity/Legal Involvement Pertinent to Current Situation/Hospitalization:  No - Comment as needed Significant Relationships:  Other Family Members, Spouse Lives with:  Facility Resident Do you feel safe going back to the place where you live?  Yes Need for family participation in patient care:  Yes (Comment)  Care giving concerns:  Patient recently admitted to [facility] as a private pay after insurance denied SNF.  Patient has medicaid pending and has complete application. Patient will be a LTC patient at facility and will return at DC. Husband is main contact and makes decisions for patient. Facility aware of patient and agreeable to return at discharge.   Social Worker assessment / plan:  No psycho-social changes since last assessment. Husband has applied for Medicaid and application is pending for LTC medicaid. Plan will be for patient to return at DC to [facility].  FL2 udpated.  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  Emotional Assessment Appearance:  Appears stated age Attitude/Demeanor/Rapport:  Lethargic, Other, Unable to Assess (patient is nonverbal at baseline) Affect (typically observed):  Calm, Quiet Orientation:  Oriented to Self Alcohol / Substance use:  Not Applicable Psych involvement (Current and /or in the community):  No (Comment)  Discharge Needs  Concerns to be addressed:  No discharge needs identified Readmission within the last 30 days:  Yes Current discharge risk:  None Barriers to Discharge:  No Barriers Identified, Continued Medical Work up   [place], LCSW 10/04/2016, 10:08 AM

## 2016-10-04 NOTE — Progress Notes (Signed)
Lovenox changed to 30 mg daily for CrCl<30. 

## 2016-10-04 NOTE — Progress Notes (Signed)
Initial Nutrition Assessment  DOCUMENTATION CODES:   Severe malnutrition in context of chronic illness  INTERVENTION:  1. Ensure Enlive po TID, each supplement provides 350 kcal and 20 grams of protein 2. NDD2-Thin  Per SLP  NUTRITION DIAGNOSIS:   Malnutrition related to chronic illness as evidenced by severe depletion of muscle mass, severe depletion of body fat.  GOAL:   Patient will meet greater than or equal to 90% of their needs  MONITOR:   PO intake, Labs, I & O's, Weight trends, Supplement acceptance  REASON FOR ASSESSMENT:   Malnutrition Screening Tool    ASSESSMENT:   Deborah Espinoza is a 82 y.o. female with a known history of Hyperlipidemia, breast cancer, GERD, recent right shoulder replacement presents to the emergency department for evaluation of abnormal labs.  Patient is currently non-verbal, unable to iterate any concerns or complaints No family at bedside Well known to RD  Nutrition-Focused physical exam completed. Findings are severe fat depletion, severe muscle depletion, and no edema.  Discussed with SLP.  Consumed Eggs, Grits, for breakfast. Meal completion documented at 30%  Diet Order:  DIET DYS 2 Room service appropriate? Yes with Assist; Fluid consistency: Thin  Skin:  Wound (see comment) (Stg I To coccyx)  Last BM:  PTA  Height:   Ht Readings from Last 1 Encounters:  10/03/16 5' (1.524 m)    Weight:   Wt Readings from Last 1 Encounters:  10/03/16 94 lb (42.6 kg)    Ideal Body Weight:  45.45 kg  BMI:  Body mass index is 18.36 kg/m.  Estimated Nutritional Needs:   Kcal:  1080-1300 calories  Protein:  52-64 gm  Fluid:  >/= 1.1L  EDUCATION NEEDS:   No education needs identified at this time  Satira Anis. Deborah Soderlund, MS, RD LDN Inpatient Clinical Dietitian Pager [phone]

## 2016-10-04 NOTE — Consult Note (Signed)
[facility] CONSULT NOTE    Date: 10/04/2016                  Patient Name:  Deborah Espinoza  MRN: [id number]  DOB: 1932/12/22  Age / Sex: 82 y.o., female         PCP: Loura Pardon, MD                 Service Requesting Consult: Internal Medicine                 Reason for Consult: Hypernatremia            History of Present Illness: Patient is a 82 y.o. female with a PMHx of allergic rhinitis, right breast cancer, GERD, hyperlipidemia, osteoporosis, constipation varicose veins, dementia, who was admitted to [facility] on 10/03/2016 for evaluation of hypernatremia.  Patient experienced a fall in early November which ultimately led to a right shoulder replacement.  She was subsequently moved over to an assisted living facility for rehabilitation.  Patient had some labs performed at the facility which demonstrated a serum sodium of 167.  The patient was subsequent is sent here for evaluation.upon presentation here serum sodium was 168 and at the moment is 157.  Case was discussed with speech pathology.  It appears as the patient pockets food in her mouth at times.  Her by mouth fluid intake is also in question.  Patient also found to have acute renal failure with a presenting creatinine of 1.5 which is now down to 1.25.  She is currently on half-normal saline with potassium repletion.  Patient cannot provide any history or ROS at this time due to underlying altered mental status.    Medications: Outpatient medications: Prescriptions Prior to Admission  Medication Sig Dispense Refill Last Dose  . acetaminophen (TYLENOL) 325 MG tablet Take 2 tablets (650 mg total) by mouth every 6 (six) hours as needed for mild pain (or Fever >/= 101).   10/03/2016 at 1455  . amLODipine (NORVASC) 5 MG tablet Take 1 tablet (5 mg total) by mouth daily. 90 tablet 3 10/03/2016 at 1455  . bisacodyl (DULCOLAX) 5 MG EC tablet Take 1 tablet (5 mg total) by mouth daily as needed for moderate  constipation. 30 tablet 0 prn at prn  . Cholecalciferol (VITAMIN D PO) Take 1 tablet by mouth daily.   10/03/2016 at 1455  . donepezil (ARICEPT) 10 MG tablet Take 10 mg by mouth at bedtime.   09/30/2016 at 2115  . Multiple Vitamins-Minerals (PRESERVISION AREDS PO) Take 1 tablet by mouth daily.   10/03/2016 at 1455  . oxyCODONE (OXY IR/ROXICODONE) 5 MG immediate release tablet Take 1-2 tablets (5-10 mg total) by mouth every 4 (four) hours as needed for breakthrough pain. 60 tablet 0 Past Week at Unknown time  . traMADol (ULTRAM) 50 MG tablet Take 1 tablet (50 mg total) by mouth every 6 (six) hours as needed for moderate pain. 40 tablet 0 10/02/2016 at 1653    Current medications: Current Facility-Administered Medications  Medication Dose Route Frequency Provider Last Rate Last Dose  . acetaminophen (TYLENOL) tablet 650 mg  650 mg Oral Q6H PRN Alexis Hugelmeyer, DO       Or  . acetaminophen (TYLENOL) suppository 650 mg  650 mg Rectal Q6H PRN Alexis Hugelmeyer, DO      . amLODipine (NORVASC) tablet 5 mg  5 mg Oral Daily Alexis Hugelmeyer, DO   5 mg at 10/04/16 0944  . bisacodyl (DULCOLAX)  EC tablet 5 mg  5 mg Oral Daily PRN Alexis Hugelmeyer, DO      . cholecalciferol (VITAMIN D) tablet 1,000 Units  1,000 Units Oral Daily Alexis Hugelmeyer, DO   1,000 Units at 10/04/16 0944  . ciprofloxacin (CIPRO) IVPB 400 mg  400 mg Intravenous Q24H Alexis Hugelmeyer, DO      . dextrose 5 % and 0.45 % NaCl with KCl 20 mEq/L infusion   Intravenous Continuous Alexis Hugelmeyer, DO 75 mL/hr at 10/04/16 5427    . donepezil (ARICEPT) tablet 10 mg  10 mg Oral QHS Alexis Hugelmeyer, DO      . feeding supplement (ENSURE ENLIVE) (ENSURE ENLIVE) liquid 237 mL  237 mL Oral TID BM Sital Mody, MD      . heparin injection 5,000 Units  5,000 Units Subcutaneous Q8H Sital Mody, MD      . magnesium citrate solution 1 Bottle  1 Bottle Oral Once PRN Alexis Hugelmeyer, DO      . ondansetron (ZOFRAN) tablet 4 mg  4 mg Oral Q6H PRN  Alexis Hugelmeyer, DO       Or  . ondansetron (ZOFRAN) injection 4 mg  4 mg Intravenous Q6H PRN Alexis Hugelmeyer, DO      . oxyCODONE (Oxy IR/ROXICODONE) immediate release tablet 5-10 mg  5-10 mg Oral Q4H PRN Alexis Hugelmeyer, DO      . potassium chloride 20 MEQ/15ML (10%) solution 40 mEq  40 mEq Oral Once Bettey Costa, MD      . senna-docusate (Senokot-S) tablet 1 tablet  1 tablet Oral QHS PRN Alexis Hugelmeyer, DO      . sodium chloride flush (NS) 0.9 % injection 3 mL  3 mL Intravenous Q12H Alexis Hugelmeyer, DO   3 mL at 10/04/16 0324  . traMADol (ULTRAM) tablet 50 mg  50 mg Oral Q6H PRN Alexis Hugelmeyer, DO   50 mg at 10/04/16 0957  . zolpidem (AMBIEN) tablet 5 mg  5 mg Oral QHS PRN Alexis Hugelmeyer, DO          Allergies: Allergies  Allergen Reactions  . Cephalexin   . Clarithromycin   . Codeine   . Hydrocodone   . Penicillins   . Risedronate Sodium       Past Medical History: Past Medical History:  Diagnosis Date  . Allergic rhinitis, cause unspecified   . Breast cancer ([place])    right  . Cataracts, bilateral    complications from eye surgery  . Dizziness and giddiness   . Edema   . Elevated blood pressure reading without diagnosis of hypertension   . External hemorrhoids without mention of complication   . GERD (gastroesophageal reflux disease)   . Lichen, unspecified    sclerosis  . Memory loss   . Mixed hyperlipidemia   . Osteoporosis, unspecified   . Other constipation   . Personal history of colonic polyps   . Sprain of neck   . Unspecified constipation   . Unspecified essential hypertension   . Unspecified hearing loss   . Urticaria, unspecified   . Varicose veins of lower extremities with other complications      Past Surgical History: Past Surgical History:  Procedure Laterality Date  . BILATERAL OOPHORECTOMY     bilateral  . BREAST BIOPSY  12/07   pos ductal carcinoma in situ  . BREAST LUMPECTOMY  2008   RIGHT  . CATARACT EXTRACTION   04/2003  . COLONOSCOPY  11/02   polyp  . COLONOSCOPY  09/2004  polyps  . COLONOSCOPY  11/08   adenomatous polyps  . DEXA  2001   Osteopenia  . DEXA     improved OP  . DEXA  11/2004   OP  . DEXA  2/09   OP T score-3.0 spine and -2.5 in fem neck  . nuclear stress  05/2007   negative  . ovarian tumor  11/04   benign  . PARTIAL HYSTERECTOMY  1987   fibroids  . REVERSE SHOULDER ARTHROPLASTY Right 09/19/2016   Procedure: REVERSE RIGHT TOTAL SHOULDER ARTHROPLASTY;  Surgeon: Corky Mull, MD;  Location: [facility] ORS;  Service: Orthopedics;  Laterality: Right;  . stress perfusion cardiac study  7/22008   Normal with normal EF     Family History: Family History  Problem Relation Age of Onset  . Brain cancer Father   . Diabetes Father   . Kidney cancer Mother     renal cell  . Cancer Mother   . Diabetes Brother   . Diabetes Brother   . Colon cancer Brother   . Alzheimer's disease      Aunt     Social History: Social History   Social History  . Marital status: Married    Spouse name: N/A  . Number of children: N/A  . Years of education: N/A   Occupational History  . Not on file.   Social History Main Topics  . Smoking status: Never Smoker  . Smokeless tobacco: Never Used  . Alcohol use No  . Drug use: No  . Sexual activity: Not on file   Other Topics Concern  . Not on file   Social History Narrative  . No narrative on file     Review of Systems: Patient unable to provide ROS due to altered mental status.  Vital Signs: Blood pressure 120/60, pulse 88, temperature 98.6 F (37 C), resp. rate 18, height 5' (1.524 m), weight 42.6 kg (94 lb), SpO2 96 %.  Weight trends: Filed Weights   10/03/16 1852  Weight: 42.6 kg (94 lb)    Physical Exam: General: NAD, laying in bed  Head: Normocephalic, atraumatic.  Eyes: Anicteric, EOMI  Nose: Mucous membranes dry, not inflammed, nonerythematous.  Throat: Oropharynx nonerythematous, oral mucosa quite dry  Neck:  Supple, trachea midline.  Lungs:  Normal respiratory effort. Clear to auscultation BL without crackles or wheezes.  Heart: RRR. S1 and S2 normal without gallop, murmur, or rubs.  Abdomen:  BS normoactive. Soft, Nondistended, non-tender.  No masses or organomegaly.  Extremities: No pretibial edema.  Neurologic: Awake, alert, disoriented, visually tracks  Skin: No visible rashes, scars.    Lab results: Basic Metabolic Panel:  Recent Labs Lab 10/03/16 1924 10/04/16 0354  NA 160* 157*  K 3.3* 3.1*  CL 126* 126*  CO2 28 29  GLUCOSE 103* 127*  BUN 38* 33*  CREATININE 1.54* 1.25*  CALCIUM 11.5* 10.9*  MG  --  2.4  PHOS  --  2.5    Liver Function Tests:  Recent Labs Lab 10/03/16 1924  AST 26  ALT 18  ALKPHOS 102  BILITOT 0.5  PROT 6.5  ALBUMIN 3.1*   No results for input(s): LIPASE, AMYLASE in the last 168 hours. No results for input(s): AMMONIA in the last 168 hours.  CBC:  Recent Labs Lab 10/03/16 1924 10/04/16 0354  WBC 12.7* 10.4  NEUTROABS 10.5*  --   HGB 11.4* 10.3*  HCT 35.4 31.1*  MCV 96.7 95.6  PLT 377 311    Cardiac  Enzymes: No results for input(s): CKTOTAL, CKMB, CKMBINDEX, TROPONINI in the last 168 hours.  BNP: Invalid input(s): POCBNP  CBG: No results for input(s): GLUCAP in the last 168 hours.  Microbiology: Results for orders placed or performed during the hospital encounter of 10/03/16  MRSA PCR Screening     Status: None   Collection Time: 10/04/16 12:55 AM  Result Value Ref Range Status   MRSA by PCR NEGATIVE NEGATIVE Final    Comment:        The GeneXpert MRSA Assay (FDA approved for NASAL specimens only), is one component of a comprehensive MRSA colonization surveillance program. It is not intended to diagnose MRSA infection nor to guide or monitor treatment for MRSA infections.     Coagulation Studies: No results for input(s): LABPROT, INR in the last 72 hours.  Urinalysis:  Recent Labs  10/03/16 1932   COLORURINE YELLOW*  LABSPEC 1.014  PHURINE 5.0  GLUCOSEU NEGATIVE  HGBUR 2+*  BILIRUBINUR NEGATIVE  KETONESUR NEGATIVE  PROTEINUR 30*  NITRITE POSITIVE*  LEUKOCYTESUR 3+*      Imaging:  No results found.   Assessment & Plan: Pt is a 82 y.o. female with a PMHx of allergic rhinitis, right breast cancer, GERD, hyperlipidemia, osteoporosis, constipation varicose veins, dementia, who was admitted to [facility] on 10/03/2016 for evaluation of hypernatremia.    1.  Acute renal failure due to severe dehydration. 2.  Severe hypernatremia. 3.  Hypokalemia. 4.  Hypercalcemia.    Plan:  The patient appears to have an interesting constellation of findings.  She now presents with severe hypernatremia as well as altered mental status.  We also note that she has hypercalcemia and acute renal failure.  This most likely represents severe dehydration however we should give consideration to underlying multiple myeloma as well.we will proceed with checking renal ultrasound, SPEP, UPEP.we will discontinue half-normal saline in favor of D5W to be given at a rate of 120 cc per hour.  Continue to monitor serum sodium.  In addition it appears that the patient had some issues with pocketing food in her mouth.  Recommend consideration of palliative care consult as well.  Thanks for consultation.

## 2016-10-04 NOTE — Progress Notes (Addendum)
[place] NAME: Deborah Espinoza    MR#:  [id number]  DATE OF BIRTH:  10-17-1933  SUBJECTIVE:   Patient With dementia and hypernatremia very confused  REVIEW OF SYSTEMS:    Review of Systems  Unable to perform ROS: Dementia    Tolerating Diet: some food      DRUG ALLERGIES:   Allergies  Allergen Reactions  . Cephalexin   . Clarithromycin   . Codeine   . Hydrocodone   . Penicillins   . Risedronate Sodium     VITALS:  Blood pressure (!) 167/71, pulse 72, temperature 98.4 F (36.9 C), temperature source Oral, resp. rate 19, height 5' (1.524 m), weight 42.6 kg (94 lb), SpO2 98 %.  PHYSICAL EXAMINATION:   Physical Exam  Constitutional: No distress.  frail  HENT:  Head: Normocephalic.  Eyes: No scleral icterus.  Neck: Neck supple. No JVD present. No tracheal deviation present.  Cardiovascular: Normal rate and regular rhythm.  Exam reveals no gallop and no friction rub.   Murmur heard. Pulmonary/Chest: Effort normal and breath sounds normal. No respiratory distress. She has no wheezes. She has no rales. She exhibits no tenderness.  Abdominal: Soft. Bowel sounds are normal. She exhibits no distension and no mass. There is no tenderness. There is no rebound and no guarding.  Musculoskeletal: She exhibits no edema.  Neurological: She is alert.  Confused mumbling  Skin: Skin is warm. No rash noted. No erythema.  Bandage on from shoulder repair  Psychiatric: Affect and judgment normal.      LABORATORY PANEL:   CBC  Recent Labs Lab 10/04/16 0354  WBC 10.4  HGB 10.3*  HCT 31.1*  PLT 311   ------------------------------------------------------------------------------------------------------------------  Chemistries   Recent Labs Lab 10/03/16 1924 10/04/16 0354  NA 160* 157*  K 3.3* 3.1*  CL 126* 126*  CO2 28 29  GLUCOSE 103* 127*  BUN 38* 33*  CREATININE 1.54* 1.25*  CALCIUM 11.5* 10.9*  MG   --  2.4  AST 26  --   ALT 18  --   ALKPHOS 102  --   BILITOT 0.5  --    ------------------------------------------------------------------------------------------------------------------  Cardiac Enzymes No results for input(s): TROPONINI in the last 168 hours. ------------------------------------------------------------------------------------------------------------------  RADIOLOGY:  No results found.   ASSESSMENT AND PLAN:   82 year old female with a history of moderate dementia and recent right shoulder replacement who presents with abnormal findings on laboratory.  1. Severe hypernatremia in the setting of dehydration and dementia: Continue D5 and obtain nephrology consultation  2. Acute kidney injury: This is improved with IV fluids  3. Hypokalemia: Potassium is provided with IV fluids  4. UTI: Continue ciprofloxacin and follow-up on final urine culture results  5. Dementia: Continue Aricept and obtain speech evaluation. Nurses reporting that patient is pocketing food.   6. Hypertension: Continue Norvasc  7. Recent right shoulder arthroplasty: Consult orthopedic surgery.    CODE STATUS: FULL  TOTAL TIME TAKING CARE OF THIS PATIENT: 33 minutes.     POSSIBLE D/C 2-3 days, DEPENDING ON CLINICAL CONDITION.   Christian Treadway M.D on 10/04/2016 at 10:54 AM  Between 7am to 6pm - Pager - [phone] After 6pm go to [email or web address] - password [email or web address] [place] [facility]  Office  [phone]  CC: Primary care physician; Loura Pardon, MD  Note: This dictation was prepared with Dragon dictation along with smaller phrase technology. Any transcriptional errors that result from this process are unintentional.

## 2016-10-04 NOTE — Care Management Important Message (Signed)
Important Message  Patient Details  Name: KINNA GREENIA MRN: [id number] Date of Birth: Apr 05, 1933   Medicare Important Message Given:  Yes    Shelbie Ammons, RN 10/04/2016, 12:21 PM

## 2016-10-05 ENCOUNTER — Encounter: Payer: Self-pay | Admitting: Family Medicine

## 2016-10-05 ENCOUNTER — Inpatient Hospital Stay: Payer: Medicare Other

## 2016-10-05 LAB — BASIC METABOLIC PANEL
ANION GAP: 3 — AB (ref 5–15)
BUN: 29 mg/dL — AB (ref 6–20)
CALCIUM: 11.4 mg/dL — AB (ref 8.9–10.3)
CO2: 24 mmol/L (ref 22–32)
Chloride: 121 mmol/L — ABNORMAL HIGH (ref 101–111)
Creatinine, Ser: 1.32 mg/dL — ABNORMAL HIGH (ref 0.44–1.00)
GFR calc Af Amer: 43 mL/min — ABNORMAL LOW (ref >60)
GFR, EST NON AFRICAN AMERICAN: 37 mL/min — AB (ref >60)
GLUCOSE: 136 mg/dL — AB (ref 65–99)
POTASSIUM: 5.4 mmol/L — AB (ref 3.5–5.1)
SODIUM: 148 mmol/L — AB (ref 135–145)

## 2016-10-05 LAB — PROTEIN / CREATININE RATIO, URINE
Creatinine, Urine: 92 mg/dL
PROTEIN CREATININE RATIO: 0.41 mg/mg{creat} — AB (ref 0.00–0.15)
TOTAL PROTEIN, URINE: 38 mg/dL

## 2016-10-05 MED ORDER — APIXABAN 5 MG PO TABS
10.0000 mg | ORAL_TABLET | Freq: Two times a day (BID) | ORAL | Status: DC
  Administered 2016-10-05 – 2016-10-07 (×4): 10 mg via ORAL
  Filled 2016-10-05 (×4): qty 2

## 2016-10-05 MED ORDER — APIXABAN 5 MG PO TABS: 5.0000 mg | ORAL_TABLET | Freq: Two times a day (BID) | ORAL | Status: DC

## 2016-10-05 MED ORDER — DEXTROSE 5 % IV SOLN
INTRAVENOUS | Status: DC
  Administered 2016-10-05 – 2016-10-06 (×3): via INTRAVENOUS

## 2016-10-05 MED ORDER — DEXTROSE 5 % IV SOLN
INTRAVENOUS | Status: DC
Start: 2016-10-05 — End: 2016-10-05
  Filled 2016-10-05: qty 1000

## 2016-10-05 MED ORDER — DOCUSATE SODIUM 50 MG/5ML PO LIQD
100.0000 mg | Freq: Two times a day (BID) | ORAL | Status: DC
  Administered 2016-10-05 – 2016-10-06 (×2): 100 mg via ORAL
  Filled 2016-10-05 (×3): qty 10

## 2016-10-05 MED ORDER — APIXABAN 2.5 MG PO TABS: 2.5000 mg | ORAL_TABLET | Freq: Two times a day (BID) | ORAL | Status: DC

## 2016-10-05 NOTE — Progress Notes (Signed)
[place] [facility] [place] NAME: Deborah Espinoza    MR#:  [id number]  DATE OF BIRTH:  07/30/1933  SUBJECTIVE:   Patient is demented.  REVIEW OF SYSTEMS:    Review of Systems  Unable to perform ROS: Dementia    Tolerating Diet: some food  DRUG ALLERGIES:   Allergies  Allergen Reactions  . Cephalexin   . Clarithromycin   . Codeine   . Hydrocodone   . Penicillins   . Risedronate Sodium     VITALS:  Blood pressure 121/86, pulse 77, temperature 98.5 F (36.9 C), temperature source Oral, resp. rate 14, height 5' (1.524 m), weight 94 lb (42.6 kg), SpO2 96 %.  PHYSICAL EXAMINATION:   Physical Exam  Constitutional: No distress.  frail  HENT:  Head: Normocephalic.  Eyes: No scleral icterus.  Neck: Neck supple. No JVD present. No tracheal deviation present.  Cardiovascular: Normal rate and regular rhythm.  Exam reveals no gallop and no friction rub.   Murmur heard. Pulmonary/Chest: Effort normal and breath sounds normal. No respiratory distress. She has no wheezes. She has no rales. She exhibits no tenderness.  Abdominal: Soft. Bowel sounds are normal. She exhibits no distension and no mass. There is no tenderness. There is no rebound and no guarding.  Musculoskeletal: She exhibits edema.  Right arm.  Neurological:  Demented.  Skin: Skin is warm. No rash noted. No erythema.  Bandage on from shoulder repair  Psychiatric:  Demented.      LABORATORY PANEL:   CBC  Recent Labs Lab 10/04/16 0354  WBC 10.4  HGB 10.3*  HCT 31.1*  PLT 311   ------------------------------------------------------------------------------------------------------------------  Chemistries   Recent Labs Lab 10/03/16 1924 10/04/16 0354 10/05/16 0457  NA 160* 157* 148*  K 3.3* 3.1* 5.4*  CL 126* 126* 121*  CO2 28 29 24   GLUCOSE 103* 127* 136*  BUN 38* 33* 29*  CREATININE 1.54* 1.25* 1.32*  CALCIUM 11.5* 10.9* 11.4*  MG  --  2.4  --   AST  26  --   --   ALT 18  --   --   ALKPHOS 102  --   --   BILITOT 0.5  --   --    ------------------------------------------------------------------------------------------------------------------  Cardiac Enzymes No results for input(s): TROPONINI in the last 168 hours. ------------------------------------------------------------------------------------------------------------------  RADIOLOGY:  US Renal  Result Date: 10/04/2016 CLINICAL DATA:  Acute renal failure EXAM: RENAL / URINARY TRACT ULTRASOUND COMPLETE COMPARISON:  None. FINDINGS: Right Kidney: Length: 9.5 cm. Echogenicity and renal cortical thickness are within normal limits. No perinephric fluid or hydronephrosis visualized. There is a cyst arising from the upper pole of the right kidney measuring 2.1 x 1.2 x 1.9 cm. There is a cyst arising from the lower pole of the right kidney measuring 1.6 x 1.6 x 1.7 cm. No sonographically demonstrable calculus or ureterectasis. Left Kidney: Length: 8.1 cm. Echogenicity and renal cortical thickness are within normal limits. No mass, perinephric fluid, or hydronephrosis visualized. No sonographically demonstrable calculus or ureterectasis. Bladder: There is debris evident in the urinary bladder. No well-defined mass appreciable by ultrasound in the urinary bladder. Urinary bladder is moderately distended. IMPRESSION: The left kidney is somewhat small. This finding may be a function of age but also may be indicative of medical renal disease. The possibility of renal artery stenosis on the left this also be of concern. In this regard, question whether patient is hypertensive. Renal echogenicity and cortical thickness are within normal  limits bilaterally. No obstructing foci identified in either kidney. There are simple cysts arising from the left kidney. Debris in the urinary bladder raises concern for infection/cystitis. Moderate distention of the urinary bladder raises concern for potential degree of  bladder outlet obstruction. Electronically Signed   By: Lowella Grip III M.D.   On: 10/04/2016 17:30     ASSESSMENT AND PLAN:   82 year old female with a history of moderate dementia and recent right shoulder replacement who presents with abnormal findings on laboratory.  1. Severe hypernatremia in the setting of dehydration and dementia: Continue D5 and follow up BMP, appreciated Dr. Holley Raring, nephrology consultation.  2. Acute kidney injury: continue D5 iv, f/u BMP.  * Hyperkalemia due to K supplement, discontinued KCl. F/u BMP.  3. Hypokalemia: Potassium is provided with IV fluids, improved but hyperkalemia.  4. UTI: Continue ciprofloxacin and follow-up on final urine culture results  5. Dementia: Continue Aricept and obtain speech evaluation. Nurses reporting that patient is pocketing food.  6. Hypertension: Continue Norvasc  7. Recent right shoulder arthroplasty:  reasonable to initiate some physical occupational therapy while she is here per Dr. Roland Rack. orthopedic surgery.  Right arm swelling. Venous US.  CODE STATUS: FULL  TOTAL TIME TAKING CARE OF THIS PATIENT: 32 minutes.   POSSIBLE D/C 2-3 days, DEPENDING ON CLINICAL CONDITION.   Demetrios Loll M.D on 10/05/2016 at 3:43 PM  Between 7am to 6pm - Pager - [phone] After 6pm go to [email or web address] - password [email or web address] [facility]  Office  [phone]  CC: Primary care physician; Loura Pardon, MD  Note: This dictation was prepared with Dragon dictation along with smaller phrase technology. Any transcriptional errors that result from this process are unintentional.

## 2016-10-05 NOTE — Progress Notes (Signed)
Advanced Care Plan.  Purpose of Encounter:  Code status. Parties in Attendance: The patient's husband, but palliative care nurse practitioner Meagan and me. Patient's Decisional Capacity: no. Subjective/Patient Story: The patient is demented. Objective/Medical Story: Deborah Espinoza is a 82 y.o. female with a known history of Hyperlipidemia, breast cancer, GERD, recent right shoulder replacement presents to the emergency department for evaluation of abnormal labs. Acute kidney injury and hypernatremia secondary to dehydration and UTI. She is being treated with D5 IV and antibiotics. She is demented. I discussed with patient husband about the patient's current condition and poor prognosis.   Goals of Care Determinations: DNR.  The patient is demented. Her husband is POA and he doesn't want a DO NOT RESUSCITATE at this time.  Plan:  Code Status: FULL CODE.  Time spent discussing advance care planning: 20 minutes.

## 2016-10-05 NOTE — Consult Note (Signed)
Consultation Note Date: 10/06/2016   Patient Name: Deborah Espinoza  DOB: Aug 08, 1933  MRN: [id number]  Age / Sex: 82 y.o., female  PCP: Abner Greenspan, MD Referring Physician: Demetrios Loll, MD  Reason for Consultation: Establishing goals of care  HPI/Patient Profile: 82 y.o. female  with past medical history of dementia, hyperlipidemia, breast cancer, GERD, colon polyps, and falls admitted on 10/03/2016 with abnormal labs-sodium of 167. She had a recent fall with hospitalization s/p reverse right total shoulder arthroplasty on 09/19/16. She then went to [facility] for rehab. Diagnosed with acute kidney injury and severe hypernatremia in the setting of dehydration and dementia. Also positive for UTI. She is receiving D5_0  and IV cipro. Nephrology following. Speech therapy has evaluated and recommending dysphagia 2, thin liquids. Physical therapy consulted but patient not participating in therapy. Palliative medicine consultation for goals of care.    Clinical Assessment and Goals of Care: Met with patient and husband at bedside. Patient is alert but disoriented and not following commands. No signs or symptoms of discomfort. Introduced palliative care and the support we will provide during hospitalization. They have been married for over 62 years and have two children and four grandchildren. 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 She was diagnosed with dementia about five years ago and her husband has noticed a decline in the past year, with poor functional and nutritional status.   Discussed her current medical status along with co-morbidities that are contributing. Discussed the natural trajectory of dementia and my fear of this becoming a cycle with dehydration. Husband tells me she is only dehydrated because they weren't giving her enough water at the facility and that there were many  times he would be visiting and she wouldn't be checked on for hours. Educated on these big decisions (code status, artificial nutrition/hyrdration, feeding tube) he may have to make for her in the near future, as husband and POA, since she is unable to make decisions for herself.  Discussed code status and advanced directives. Asked husband, if Deborah Espinoza's heart were to stop and she would naturally die, would she want to be resuscitated or on life support? He tells me he would want [place] to try "one time to bring her back" but would not want anything long term. Educated on my recommendation against resuscitation with her age, co-morbidities, and low chance of her surviving. Hard choices copy given and strongly encouraged him to read.   NEXT OF KIN-husband Deborah Espinoza)   SUMMARY OF RECOMMENDATIONS    Discussed and educated in detail code status. Husband wishes for her to remain a FULL CODE. Recommended he read Hard Choices booklet and reconsider this.   Discussed trajectory of dementia and my fear of this becoming a cycle of dehydration and re-hospitalization.   Husband requires continued discussion and eduction on [place] and code status.  PMT will f/u 10/06/16.   Code Status/Advance Care Planning:  Full code   Symptom Management:   Per attending  Palliative Prophylaxis:  Aspiration, Delirium Protocol, Oral Care and Turn Reposition  Additional Recommendations (Limitations, Scope, Preferences):  Full Scope Treatment  Psycho-social/Spiritual:   Desire for further Chaplaincy support:no  Additional Recommendations: Caregiving  Support/Resources  Prognosis:   Unable to determine  Discharge Planning: To Be Determined      Primary Diagnoses: Present on Admission: . Hypernatremia   I have reviewed the medical record, interviewed the patient and family, and examined the patient. The following aspects are pertinent.  Past Medical History:  Diagnosis Date  . Allergic rhinitis, cause  unspecified   . Breast cancer ([place])    right  . Cataracts, bilateral    complications from eye surgery  . Dizziness and giddiness   . Edema   . Elevated blood pressure reading without diagnosis of hypertension   . External hemorrhoids without mention of complication   . GERD (gastroesophageal reflux disease)   . Lichen, unspecified    sclerosis  . Memory loss   . Mixed hyperlipidemia   . Osteoporosis, unspecified   . Other constipation   . Personal history of colonic polyps   . Sprain of neck   . Unspecified constipation   . Unspecified essential hypertension   . Unspecified hearing loss   . Urticaria, unspecified   . Varicose veins of lower extremities with other complications    Social History   Social History  . Marital status: Married    Spouse name: N/A  . Number of children: N/A  . Years of education: N/A   Social History Main Topics  . Smoking status: Never Smoker  . Smokeless tobacco: Never Used  . Alcohol use No  . Drug use: No  . Sexual activity: Not Asked   Other Topics Concern  . None   Social History Narrative  . None   Family History  Problem Relation Age of Onset  . Brain cancer Father   . Diabetes Father   . Kidney cancer Mother     renal cell  . Cancer Mother   . Diabetes Brother   . Diabetes Brother   . Colon cancer Brother   . Alzheimer's disease      Aunt   Scheduled Meds: . amLODipine  5 mg Oral Daily  . apixaban  10 mg Oral BID   Followed by  . [START ON 10/12/2016] apixaban  5 mg Oral BID  . cholecalciferol  1,000 Units Oral Daily  . ciprofloxacin  400 mg Intravenous Q24H  . docusate  100 mg Oral BID  . donepezil  10 mg Oral QHS  . feeding supplement (ENSURE ENLIVE)  237 mL Oral TID BM  . sodium chloride flush  3 mL Intravenous Q12H   Continuous Infusions: . dextrose 120 mL/hr at 10/06/16 0049   PRN Meds:.acetaminophen **OR** acetaminophen, bisacodyl, magnesium citrate, ondansetron **OR** ondansetron (ZOFRAN) IV,  oxyCODONE, senna-docusate, traMADol, zolpidem Medications Prior to Admission:  Prior to Admission medications   Medication Sig Start Date End Date Taking? Authorizing Provider  acetaminophen (TYLENOL) 325 MG tablet Take 2 tablets (650 mg total) by mouth every 6 (six) hours as needed for mild pain (or Fever >/= 101). 09/15/16  Yes Demetrios Loll, MD  amLODipine (NORVASC) 5 MG tablet Take 1 tablet (5 mg total) by mouth daily. 08/08/16  Yes Abner Greenspan, MD  bisacodyl (DULCOLAX) 5 MG EC tablet Take 1 tablet (5 mg total) by mouth daily as needed for moderate constipation. 09/15/16  Yes Demetrios Loll, MD  Cholecalciferol (VITAMIN D PO) Take 1 tablet by  mouth daily.   Yes Historical Provider, MD  donepezil (ARICEPT) 10 MG tablet Take 10 mg by mouth at bedtime.   Yes Historical Provider, MD  Multiple Vitamins-Minerals (PRESERVISION AREDS PO) Take 1 tablet by mouth daily.   Yes Historical Provider, MD  oxyCODONE (OXY IR/ROXICODONE) 5 MG immediate release tablet Take 1-2 tablets (5-10 mg total) by mouth every 4 (four) hours as needed for breakthrough pain. 09/20/16  Yes Lattie Corns, PA-C  traMADol (ULTRAM) 50 MG tablet Take 1 tablet (50 mg total) by mouth every 6 (six) hours as needed for moderate pain. 09/20/16  Yes Lattie Corns, PA-C   Allergies  Allergen Reactions  . Cephalexin   . Clarithromycin   . Codeine   . Hydrocodone   . Penicillins   . Risedronate Sodium    Review of Systems  Unable to perform ROS: Dementia   Physical Exam  Constitutional: She is easily aroused.  Cardiovascular: Regular rhythm.  Exam reveals distant heart sounds.   Pulmonary/Chest: Effort normal. She has decreased breath sounds.  Abdominal: Soft. Bowel sounds are normal.  Neurological: She is alert and easily aroused. She is disoriented.  Skin: Skin is warm and dry. There is pallor.  Psychiatric: Her speech is delayed. Cognition and memory are impaired. She is inattentive.  Nursing note and vitals  reviewed.  Vital Signs: BP (!) 146/67 (BP Location: Left Arm)   Pulse 93   Temp 98.2 F (36.8 C)   Resp 16   Ht 5' (1.524 m)   Wt 42.6 kg (94 lb)   SpO2 98%   BMI 18.36 kg/m  Pain Assessment: PAINAD   Pain Score: Asleep  SpO2: SpO2: 98 % O2 Device:SpO2: 98 % O2 Flow Rate: .   IO: Intake/output summary:   Intake/Output Summary (Last 24 hours) at 10/06/16 0746 Last data filed at 10/06/16 2035  Gross per 24 hour  Intake             2408 ml  Output             1520 ml  Net              888 ml    LBM:   Baseline Weight: Weight: 42.6 kg (94 lb) Most recent weight: Weight: 42.6 kg (94 lb)     Palliative Assessment/Data: PPS 20%   Flowsheet Rows   Flowsheet Row Most Recent Value  Intake Tab  Referral Department  Hospitalist  Unit at Time of Referral  Oncology Unit  Palliative Care Primary Diagnosis  Neurology  Date Notified  10/05/16  Palliative Care Type  New Palliative care  Reason for referral  Clarify Goals of Care  Date of Admission  10/03/16  Date first seen by Palliative Care  10/05/16  # of days Palliative referral response time  0 Day(s)  # of days IP prior to Palliative referral  2  Clinical Assessment  Palliative Performance Scale Score  20%  Psychosocial & Spiritual Assessment  Palliative Care Outcomes  Patient/Family meeting held?  Yes  Who was at the meeting?  husband  Palliative Care Outcomes  Clarified goals of care, Provided advance care planning, Provided psychosocial or spiritual support      Time In: 1410 Time Out: 1530 Time Total:36mn Greater than 50%  of this time was spent counseling and coordinating care related to the above assessment and plan.  Signed by:  MIhor Dow FNP-C Palliative Medicine Team  Phone: [phone]: [phone]  Please contact Palliative  Medicine Team phone at [phone] for questions and concerns.  For individual provider: See Shea Evans

## 2016-10-05 NOTE — Evaluation (Signed)
Occupational Therapy Evaluation Patient Details Name: Deborah Espinoza MRN: [id number] DOB: 14-Mar-1933 Today's Date: 10/05/2016    History of Present Illness Pt is an 82 y.o. female who was recently admitted to [facility] with a Right Proximal Humerus Fracture and distal phalanx R thumb nondisplaced fx (09/12/16).  Pt then discharged to [facility] for respite care until current admission for surgery. Pt underwent a Reverse Total Shoulder Replacement 09/19/16. Pt has a history of Dementia, htn, and HLD.   Clinical Impression   Pt is 82 year old female who was here at [facility] 2 weeks ago and had a reverse total shoulder replacement on 09-19-16 by Dr Roland Rack and also had a distal phalanx R thyumb nondisplaced fx after a fall.  Pt is lethargic and only minimally responsive during session and not able to keep eyes open.  She was able to communicate through grimacing and able to assist with [facility] for R elbow flexion and extension and opening and closing of R hand.  Pt is able to open and close both hands and wiggle all digits of both hands.  Gentle circular motion to R shoulder was painful but tolerated. PROM of up to 70 degrees of shoulder abduction and about 30 degrees shoulder flexion and full elbow extension when she was not resisting; lumpy edema noted in R elbow and Jessica from [employer] notified who called Dr Bridgett Larsson and he ordered an [place].  Pt is total assist for all ADLs at this time.  Rec SNF for continued rehab after discharge.  Pt's husband present and educated in all AAROM and PROM exercises.    Follow Up Recommendations  SNF    Equipment Recommendations       Recommendations for Other Services       Precautions / Restrictions Precautions Precautions: Fall;Shoulder Shoulder Interventions: Shoulder sling/immobilizer Required Braces or Orthoses: Sling Restrictions Weight Bearing Restrictions: No Other Position/Activity Restrictions: order for RUE active, passive and AROM of R elbow, wrist, hand and  shoulder per Dr Roland Rack      Mobility Bed Mobility                  Transfers                      Balance                                            ADL Overall ADL's : Needs assistance/impaired Eating/Feeding: Total assistance   Grooming: Total assistance   Upper Body Bathing: Total assistance   Lower Body Bathing: Total assistance   Upper Body Dressing : Total assistance   Lower Body Dressing: Total assistance   Toilet Transfer: Total assistance   Toileting- Clothing Manipulation and Hygiene: Total assistance         General ADL Comments: Pt. presents with poor command following/following directions due to lethargy.  Pt able to assist with elbow flecxion/extension only for hand to mouth for about 3 minutes and then fell asleep again.     Vision     Perception     Praxis      Pertinent Vitals/Pain Pain Location: Pt unable to state but was grimacing with shoulder and elbow PROM Pain Descriptors / Indicators: Grimacing;Guarding Pain Intervention(s): Limited activity within patient's tolerance;Monitored during session;Premedicated before session     Hand Dominance Right   Extremity/Trunk Assessment Upper  Extremity Assessment Upper Extremity Assessment: RUE deficits/detail RUE Deficits / Details: Pt able to assist with AAROM for R elbow flexion and extension and opening and closing of R hand, 70 degrees of shoulder abduction for PROM and about 30 degrees shoulder flexion; lumpy edema noted in R elbow and Jessica from [facility] [place] notified who called Dr Bridgett Larsson and he ordered an [place] RUE Coordination: decreased fine motor;decreased gross motor LUE Deficits / Details: generalized weakness   Lower Extremity Assessment Lower Extremity Assessment: Defer to PT evaluation       Communication Communication Communication:  (pt not responsive during session but communicated wtih facial grimacing)   Cognition Arousal/Alertness:  Lethargic Behavior During Therapy: Flat affect Overall Cognitive Status: History of cognitive impairments - at baseline       Memory: Decreased recall of precautions             General Comments       Exercises       Shoulder Instructions      Home Living Family/patient expects to be discharged to:: Skilled nursing facility Living Arrangements: Spouse/significant other Available Help at Discharge: Family                                    Prior Functioning/Environment Level of Independence: Needs assistance  Gait / Transfers Assistance Needed: Per husband, pt was not getting out of bed and was lying in bed most of the time at [facility] ADL's / Homemaking Assistance Needed: Pt required total assist for all ADLs per husband's report.            OT Problem List: Decreased strength;Decreased range of motion;Decreased activity tolerance;Decreased coordination;Pain;Impaired UE functional use;Decreased safety awareness;Decreased cognition;Decreased knowledge of precautions   OT Treatment/Interventions: Therapeutic exercise;Patient/family education    OT Goals(Current goals can be found in the care plan section) Acute Rehab OT Goals Patient Stated Goal: unable to state ADL Goals Pt/caregiver will Perform Home Exercise Program: Right Upper extremity;With minimal assist (husband will be educated in exercises for RUE)  OT Frequency: Min 5X/week   Barriers to D/C:            Co-evaluation              End of Session Nurse Communication: Other (comment) (lumpy edema in R elbow area-Jessica, RN called Dr Bridgett Larsson who ordered US)  Activity Tolerance: Patient limited by pain;Patient limited by lethargy Patient left: in bed;with call bell/phone within reach;with bed alarm set;with family/visitor present   Time: 1450-1527 OT Time Calculation (min): 37 min Charges:  OT General Charges $OT Visit: 1 Procedure OT Evaluation $OT Eval Moderate Complexity:  1 Procedure OT Treatments $Therapeutic Exercise: 8-22 mins G-Codes:    Chrys Racer, OTR/L ascom [phone] 10/05/16, 3:55 PM

## 2016-10-05 NOTE — Progress Notes (Addendum)
On call MD/ Dr.  Fritzi Mandes  paged to make aware of ultrasound  of right arm results/ + DVT/ Eliquis  Ordered.

## 2016-10-05 NOTE — Progress Notes (Signed)
ANTICOAGULATION CONSULT NOTE - Initial Consult  Pharmacy Consult for apixaban Indication: DVT  Allergies  Allergen Reactions  . Cephalexin   . Clarithromycin   . Codeine   . Hydrocodone   . Penicillins   . Risedronate Sodium     Patient Measurements: Height: 5' (152.4 cm) Weight: 94 lb (42.6 kg) IBW/kg (Calculated) : 45.5   Vital Signs: Temp: 98.5 F (36.9 C) (11/29 1520) Temp Source: Oral (11/29 1520) BP: 121/86 (11/29 1520) Pulse Rate: 77 (11/29 1520)  Labs:  Recent Labs  10/03/16 1924 10/04/16 0354 10/05/16 0457  HGB 11.4* 10.3*  --   HCT 35.4 31.1*  --   PLT 377 311  --   CREATININE 1.54* 1.25* 1.32*    Estimated Creatinine Clearance: 22.5 mL/min (by C-G formula based on SCr of 1.32 mg/dL (H)).   Medical History: Past Medical History:  Diagnosis Date  . Allergic rhinitis, cause unspecified   . Breast cancer ([place])    right  . Cataracts, bilateral    complications from eye surgery  . Dizziness and giddiness   . Edema   . Elevated blood pressure reading without diagnosis of hypertension   . External hemorrhoids without mention of complication   . GERD (gastroesophageal reflux disease)   . Lichen, unspecified    sclerosis  . Memory loss   . Mixed hyperlipidemia   . Osteoporosis, unspecified   . Other constipation   . Personal history of colonic polyps   . Sprain of neck   . Unspecified constipation   . Unspecified essential hypertension   . Unspecified hearing loss   . Urticaria, unspecified   . Varicose veins of lower extremities with other complications      Assessment: 82yo F who was found to have a right upper extremity DVT.    Goal of Therapy:  Monitor platelets by anticoagulation protocol: Yes   Plan:  Will initiate treatment dose apixaban 10mg  PO BID x7days followed by apixaban 5mg  PO BID.    Loree Fee, PharmD 10/05/2016,7:19 PM

## 2016-10-05 NOTE — Progress Notes (Signed)
[facility]  ROUNDING NOTE   Subjective:  Serum sodium has come down significantly to 148. However potassium slightly high today at 5.4. Renal function slightly worse with a creatinine of 1.3 however BUN was down to 29.   Objective:  Vital signs in last 24 hours:  Temp:  [98.1 F (36.7 C)-98.6 F (37 C)] 98.1 F (36.7 C) (11/29 0437) Pulse Rate:  [72-88] 86 (11/29 0800) Resp:  [16-20] 20 (11/29 0800) BP: (120-152)/(53-97) 152/97 (11/29 0800) SpO2:  [96 %-100 %] 96 % (11/29 0800)  Weight change:  Filed Weights   10/03/16 1852  Weight: 42.6 kg (94 lb)    Intake/Output: I/O last 3 completed shifts: In: 3723 [I.V.:2823; IV Piggyback:900] Out: -    Intake/Output this shift:  No intake/output data recorded.  Physical Exam: General: Cachetic  Head: Normocephalic, atraumatic.Dry oral mucosal membranes  Eyes: Anicteric  Neck: Supple, trachea midline  Lungs:  Clear to auscultation, normal effort  Heart: S1S2 no rubs  Abdomen:  Soft, nontender, bowel sounds present  Extremities: no peripheral edema.  Right shoulder incision  Neurologic: Awake, alert, not folllowing commands consistently  Skin: No lesions       Basic Metabolic Panel:  Recent Labs Lab 10/03/16 1924 10/04/16 0354 10/05/16 0457  NA 160* 157* 148*  K 3.3* 3.1* 5.4*  CL 126* 126* 121*  CO2 28 29 24   GLUCOSE 103* 127* 136*  BUN 38* 33* 29*  CREATININE 1.54* 1.25* 1.32*  CALCIUM 11.5* 10.9* 11.4*  MG  --  2.4  --   PHOS  --  2.5  --     Liver Function Tests:  Recent Labs Lab 10/03/16 1924  AST 26  ALT 18  ALKPHOS 102  BILITOT 0.5  PROT 6.5  ALBUMIN 3.1*   No results for input(s): LIPASE, AMYLASE in the last 168 hours. No results for input(s): AMMONIA in the last 168 hours.  CBC:  Recent Labs Lab 10/03/16 1924 10/04/16 0354  WBC 12.7* 10.4  NEUTROABS 10.5*  --   HGB 11.4* 10.3*  HCT 35.4 31.1*  MCV 96.7 95.6  PLT 377 311    Cardiac Enzymes: No results for  input(s): CKTOTAL, CKMB, CKMBINDEX, TROPONINI in the last 168 hours.  BNP: Invalid input(s): POCBNP  CBG: No results for input(s): GLUCAP in the last 168 hours.  Microbiology: Results for orders placed or performed during the hospital encounter of 10/03/16  Urine culture     Status: Abnormal (Preliminary result)   Collection Time: 10/03/16  7:32 PM  Result Value Ref Range Status   Specimen Description URINE, RANDOM  Final   Special Requests NONE  Final   Culture >=100,000 COLONIES/mL GRAM NEGATIVE RODS (A)  Final   Report Status PENDING  Incomplete  MRSA PCR Screening     Status: None   Collection Time: 10/04/16 12:55 AM  Result Value Ref Range Status   MRSA by PCR NEGATIVE NEGATIVE Final    Comment:        The GeneXpert MRSA Assay (FDA approved for NASAL specimens only), is one component of a comprehensive MRSA colonization surveillance program. It is not intended to diagnose MRSA infection nor to guide or monitor treatment for MRSA infections.     Coagulation Studies: No results for input(s): LABPROT, INR in the last 72 hours.  Urinalysis:  Recent Labs  10/03/16 1932  COLORURINE YELLOW*  LABSPEC 1.014  PHURINE 5.0  GLUCOSEU NEGATIVE  HGBUR 2+*  BILIRUBINUR NEGATIVE  KETONESUR NEGATIVE  PROTEINUR 30*  NITRITE POSITIVE*  LEUKOCYTESUR 3+*      Imaging: US Renal  Result Date: 10/04/2016 CLINICAL DATA:  Acute renal failure EXAM: RENAL / URINARY TRACT ULTRASOUND COMPLETE COMPARISON:  None. FINDINGS: Right Kidney: Length: 9.5 cm. Echogenicity and renal cortical thickness are within normal limits. No perinephric fluid or hydronephrosis visualized. There is a cyst arising from the upper pole of the right kidney measuring 2.1 x 1.2 x 1.9 cm. There is a cyst arising from the lower pole of the right kidney measuring 1.6 x 1.6 x 1.7 cm. No sonographically demonstrable calculus or ureterectasis. Left Kidney: Length: 8.1 cm. Echogenicity and renal cortical thickness are  within normal limits. No mass, perinephric fluid, or hydronephrosis visualized. No sonographically demonstrable calculus or ureterectasis. Bladder: There is debris evident in the urinary bladder. No well-defined mass appreciable by ultrasound in the urinary bladder. Urinary bladder is moderately distended. IMPRESSION: The left kidney is somewhat small. This finding may be a function of age but also may be indicative of medical renal disease. The possibility of renal artery stenosis on the left this also be of concern. In this regard, question whether patient is hypertensive. Renal echogenicity and cortical thickness are within normal limits bilaterally. No obstructing foci identified in either kidney. There are simple cysts arising from the left kidney. Debris in the urinary bladder raises concern for infection/cystitis. Moderate distention of the urinary bladder raises concern for potential degree of bladder outlet obstruction. Electronically Signed   By: Lowella Grip III M.D.   On: 10/04/2016 17:30     Medications:   . dextrose 5 % with kcl    . dextrose 120 mL/hr at 10/05/16 0856   . amLODipine  5 mg Oral Daily  . cholecalciferol  1,000 Units Oral Daily  . ciprofloxacin  400 mg Intravenous Q24H  . donepezil  10 mg Oral QHS  . feeding supplement (ENSURE ENLIVE)  237 mL Oral TID BM  . heparin subcutaneous  5,000 Units Subcutaneous Q8H  . sodium chloride flush  3 mL Intravenous Q12H   acetaminophen **OR** acetaminophen, bisacodyl, magnesium citrate, ondansetron **OR** ondansetron (ZOFRAN) IV, oxyCODONE, senna-docusate, traMADol, zolpidem  Assessment/ Plan:  82 y.o. female  with a PMHx of allergic rhinitis, right breast cancer, GERD, hyperlipidemia, osteoporosis, constipation varicose veins, dementia, who was admitted to [facility] on 10/03/2016 for evaluation of hypernatremia.    1.  Acute renal failure due to severe dehydration. 2.  Severe hypernatremia. 3.  Hypokalemia. 4.  Hypercalcemia.    5.  Possible bladder outlet obstruction. 6.  UTI.  Plan:  Renal ultrasound was performed. There was debris noted within the bladder with suggestion of bladder outlet obstruction. Therefore we will place Foley catheter. Continue ciprofloxacin for underlying urinary tract infection. Hypernatremia has improved therefore we will continue D5W. We have discontinued the order of D5W that was previously supplemented with potassium chloride as serum potassium has improved. Patient still has significant hypercalcemia therefore we are awaiting SPEP and UPEP.   LOS: 2 Deborah Espinoza 11/29/201711:13 AM

## 2016-10-05 NOTE — Progress Notes (Signed)
PT Cancellation Note  Patient Details Name: Deborah Espinoza MRN: [id number] DOB: Oct 03, 1933   Cancelled Treatment:    Reason Eval/Treat Not Completed: Other (comment). Consult received and chart reviewed. Pt with recent admission for R TSR secondary to proximal humerus fx on 11/3 with Sx on 11/13. Pt has been at ALF post dc from hospital and is scheduled to return once discharged. Evaluation attempted, however pt with advanced dementia and unable to participate in therapy this date. Attempted bed mobility, however pt total assist and unable to sit at EOB without +2 assist. Very guarded on R shoulder (in immobilizer). Will re-attempt evaluation next date. Unlikely to benefit much from therapy.   Binta Statzer 10/05/2016, 9:40 AM  Greggory Stallion, PT, DPT [phone]

## 2016-10-05 NOTE — Progress Notes (Signed)
Patient was found to have ultrasound of the right upper extremity Positive for deep venous thrombosis and superficial venousthrombosis in the right upper extremity. There is thrombus within a right brachial vein and there is thrombus within the right basilic vein.  We'll start patient on by mouth oral anticoagulation with renal adjustment with [facility]  pharmacy consulted for the same

## 2016-10-06 DIAGNOSIS — Z515 Encounter for palliative care: Secondary | ICD-10-CM

## 2016-10-06 DIAGNOSIS — R627 Adult failure to thrive: Secondary | ICD-10-CM

## 2016-10-06 LAB — PROTEIN ELECTROPHORESIS, SERUM
A/G Ratio: 1.2 (ref 0.7–1.7)
Albumin ELP: 2.7 g/dL — ABNORMAL LOW (ref 2.9–4.4)
Alpha-1-Globulin: 0.3 g/dL (ref 0.0–0.4)
Alpha-2-Globulin: 0.5 g/dL (ref 0.4–1.0)
Beta Globulin: 0.8 g/dL (ref 0.7–1.3)
GAMMA GLOBULIN: 0.7 g/dL (ref 0.4–1.8)
GLOBULIN, TOTAL: 2.3 g/dL (ref 2.2–3.9)
M-SPIKE, %: 0.1 g/dL — AB
TOTAL PROTEIN ELP: 5 g/dL — AB (ref 6.0–8.5)

## 2016-10-06 LAB — BASIC METABOLIC PANEL
Anion gap: 4 — ABNORMAL LOW (ref 5–15)
BUN: 22 mg/dL — AB (ref 6–20)
CHLORIDE: 108 mmol/L (ref 101–111)
CO2: 24 mmol/L (ref 22–32)
CREATININE: 1.37 mg/dL — AB (ref 0.44–1.00)
Calcium: 10.8 mg/dL — ABNORMAL HIGH (ref 8.9–10.3)
GFR, EST AFRICAN AMERICAN: 41 mL/min — AB (ref >60)
GFR, EST NON AFRICAN AMERICAN: 35 mL/min — AB (ref >60)
Glucose, Bld: 168 mg/dL — ABNORMAL HIGH (ref 65–99)
Potassium: 4.4 mmol/L (ref 3.5–5.1)
SODIUM: 136 mmol/L (ref 135–145)

## 2016-10-06 LAB — URINE CULTURE

## 2016-10-06 LAB — PARATHYROID HORMONE, INTACT (NO CA): PTH: 76 pg/mL — AB (ref 15–65)

## 2016-10-06 LAB — PROTEIN ELECTRO, RANDOM URINE
Albumin ELP, Urine: 40.7 %
Alpha-1-Globulin, U: 2.9 %
Alpha-2-Globulin, U: 7.9 %
Beta Globulin, U: 21.6 %
GAMMA GLOBULIN, U: 26.9 %
PDF-U24IEL: 0
Total Protein, Urine: 14.5 mg/dL

## 2016-10-06 MED ORDER — SODIUM CHLORIDE 0.9 % IV SOLN
500.0000 mg | Freq: Two times a day (BID) | INTRAVENOUS | Status: DC
  Administered 2016-10-06 – 2016-10-07 (×2): 500 mg via INTRAVENOUS
  Filled 2016-10-06 (×4): qty 0.5

## 2016-10-06 MED ORDER — SODIUM CHLORIDE 0.45 % IV SOLN
INTRAVENOUS | Status: DC
  Administered 2016-10-06: 18:00:00 via INTRAVENOUS

## 2016-10-06 NOTE — Clinical Social Work Note (Signed)
Patient is a long term care resident at [facility], MSW continuing to follow patient's progress.  Plan is for patient to return back to SNF once she is medically ready for discharge and orders have been received.  Jones Broom. Norval Morton, MSW [phone]  Mon-Fri 8a-4:30p 10/06/2016 5:32 PM

## 2016-10-06 NOTE — Progress Notes (Signed)
PT Cancellation Note  Patient Details Name: Deborah Espinoza MRN: [id number] DOB: 03/29/1933   Cancelled Treatment:    Reason Eval/Treat Not Completed: Fatigue/lethargy limiting ability to participate.  Pt difficult to arouse and only briefly opens her eyes with a sternal rub of tapping her LE.  Unable to follow commands, report first name due to lethargy.  RN notified.  Will hold PT until pt more alert and able to participate.  Of note, pt found to have RUE DVT but has since been started on apixaban.  Spoke with Dr. Bridgett Larsson on the phone prior to seeing pt who reported pt is medically appropriate to work with PT.   Collie Siad PT, DPT 10/06/2016, 1:26 PM

## 2016-10-06 NOTE — Progress Notes (Signed)
Speech Language Pathology Treatment: Dysphagia  Patient Details Name: Deborah Espinoza MRN: [id number] DOB: November 24, 1932 Today's Date: 10/06/2016 Time: [id number] SLP Time Calculation (min) (ACUTE ONLY): 45 min  Assessment / Plan / Recommendation Clinical Impression  Pt presented w/ choking this morning during breakfast meal; NSG felt it was w/ the liquids(thins). Pt was seen for trials w/ Nectar liquids via TSP and pinched straw then puree trials. Pt appeared to adequately tolerate trials of Nectar liquids w/ no immediate or delayed, overt s/s of aspiration noted. Oral phase appeared wfl for bolus management of the Nectar liquids and Purees.     HPI HPI: Pt 82 year old female patient with dxs of dementia, hypertension, hyperlipidemia had a reverse the right shoulder arthroplasty on November 13. Pt has a baseline of moderate+ Dementia which pt's husband/family indicated had worsened since that admission and the shoulder surgery. Since the last admission, she has been living in an assisted living facility for rehabilitation. She had routine labs done which revealed a sodium of 167 for which she was referred to the emergency department. Patient's husband denies any recent changes to her status although he says he cannot speak for her mental status and alertness of late. He also does not know whether she has been eating or drinking or has had a decreased by mouth intake. Pt does require full assistance for feeding at meals; encouragement w/ oral intake. She is dependent for all ADLs.       SLP Plan  Continue with current plan of care     Recommendations  Diet recommendations: Dysphagia 1 (puree);Nectar-thick liquid Liquids provided via: Teaspoon;Cup Medication Administration: Crushed with puree (as able) Supervision: Staff to assist with self feeding;Full supervision/cueing for compensatory strategies Compensations: Minimize environmental distractions;Slow rate;Small sips/bites;Lingual sweep for  clearance of pocketing;Monitor for anterior loss;Multiple dry swallows after each bite/sip;Follow solids with liquid Postural Changes and/or Swallow Maneuvers: Seated upright 90 degrees;Upright 30-60 min after meal                General recommendations:  (Dietician) Oral Care Recommendations: Oral care BID;Staff/trained caregiver to provide oral care Follow up Recommendations: Skilled Nursing facility (TBD) Plan: Continue with current plan of care       GO                Hannia Matchett 10/06/2016, 2:42 PM

## 2016-10-06 NOTE — Progress Notes (Signed)
Occupational Therapy Treatment Patient Details Name: Deborah Espinoza MRN: [id number] DOB: September 12, 1933 Today's Date: 10/06/2016    History of present illness Pt is an 82 y.o. female who was recently admitted to [facility] with a Right Proximal Humerus Fracture and distal phalanx R thumb nondisplaced fx (09/12/16).  Pt then discharged to [facility] for respite care until current admission for surgery. Pt underwent a Reverse Total Shoulder Replacement 09/19/16. Pt has a history of Dementia, htn, and HLD.   OT comments   Pt wtih new diagnosis of DVT X2 in RUE and is on anticoagulation since last night.  Spoke to Ray from [place] who was in the room with patient.  Orders continue for PROM and AROM for RUE and hand but pt sleepy and more resistant to AAROM and PROM today but tolerated about 15 minutes of PROM for hand, wrist, elbow and shoulder but was resisting at end range most of session.  Sling repositioned after exercise.  Edema in R elbow area is more dense today compared to yesterday. No family present for any training.  Follow Up Recommendations  SNF    Equipment Recommendations       Recommendations for Other Services      Precautions / Restrictions Precautions Precautions: Fall;Shoulder Shoulder Interventions: Shoulder sling/immobilizer Required Braces or Orthoses: Sling Restrictions Weight Bearing Restrictions: No Other Position/Activity Restrictions: order for RUE active, passive and AROM of R elbow, wrist, hand and shoulder per Dr Roland Rack       Mobility Bed Mobility                  Transfers                      Balance                                   ADL Overall ADL's : Needs assistance/impaired                                       General ADL Comments: Pt wtih new diagnosis of DVT X2 in RUE and is on anticoagulation since last night.  Orders continue for PROM and AROM for RUE and hand but pt sleepy and more  resistant to AAROM and PROM today but tolerated about 15 minutes of PROM for hand, wrist, elbow and shoulder but was resisting at end range most of session.  Sling repositioned after exercise.  Edema in R elbow area is more dense today compared to yesterday.       Vision                     Perception     Praxis      Cognition   Behavior During Therapy: Flat affect Overall Cognitive Status: History of cognitive impairments - at baseline       Memory: Decreased recall of precautions               Extremity/Trunk Assessment               Exercises     Shoulder Instructions       General Comments      Pertinent Vitals/ Pain       Pain Assessment: Faces Pain Score: 0-No pain Faces Pain Scale: No hurt Pain Location:  Pt unable to state but was grimacing mainly during end of range for PROM and exercises modified based on response. Pain Intervention(s): Limited activity within patient's tolerance;Monitored during session;Premedicated before session  Home Living                                          Prior Functioning/Environment              Frequency  Min 5X/week        Progress Toward Goals  OT Goals(current goals can now be found in the care plan section)        Plan      Co-evaluation                 End of Session     Activity Tolerance Patient limited by pain;Patient limited by lethargy   Patient Left in bed;with call bell/phone within reach;with bed alarm set   Nurse Communication          Time: 1445-1500 OT Time Calculation (min): 15 min  Charges: OT General Charges $OT Visit: 1 Procedure OT Treatments $Therapeutic Exercise: 8-22 mins  Chrys Racer, OTR/L ascom [phone] 10/06/16, 3:12 PM

## 2016-10-06 NOTE — Progress Notes (Addendum)
Deborah Espinoza at [place] NAME: Deborah Espinoza    MR#:  [id number]  DATE OF BIRTH:  12/31/1932  SUBJECTIVE:   Patient is demented. She is more awake today. She chocked once this am.  REVIEW OF SYSTEMS:    Review of Systems  Unable to perform ROS: Dementia    Tolerating Diet: some food  DRUG ALLERGIES:   Allergies  Allergen Reactions  . Cephalexin   . Clarithromycin   . Codeine   . Hydrocodone   . Penicillins   . Risedronate Sodium     VITALS:  Blood pressure (!) 130/99, pulse 98, temperature 98.5 F (36.9 C), temperature source Oral, resp. rate 19, height 5' (1.524 m), weight 94 lb (42.6 kg), SpO2 98 %.  PHYSICAL EXAMINATION:   Physical Exam  Constitutional: No distress.  frail  HENT:  Head: Normocephalic.  Eyes: No scleral icterus.  Neck: Neck supple. No JVD present. No tracheal deviation present.  Cardiovascular: Normal rate and regular rhythm.  Exam reveals no gallop and no friction rub.   Murmur heard. Pulmonary/Chest: Effort normal and breath sounds normal. No respiratory distress. She has no wheezes. She has no rales. She exhibits no tenderness.  Abdominal: Soft. Bowel sounds are normal. She exhibits no distension and no mass. There is no tenderness. There is no rebound and no guarding.  Musculoskeletal: She exhibits edema.  Right arm.  Neurological:  Demented.  Skin: Skin is warm. No rash noted. No erythema.  Bandage on from shoulder repair  Psychiatric:  Demented.      LABORATORY PANEL:   CBC  Recent Labs Lab 10/04/16 0354  WBC 10.4  HGB 10.3*  HCT 31.1*  PLT 311   ------------------------------------------------------------------------------------------------------------------  Chemistries   Recent Labs Lab 10/03/16 1924 10/04/16 0354  10/06/16 0359  NA 160* 157*  < > 136  K 3.3* 3.1*  < > 4.4  CL 126* 126*  < > 108  CO2 28 29  < > 24  GLUCOSE 103* 127*  < > 168*  BUN 38* 33*  < > 22*   CREATININE 1.54* 1.25*  < > 1.37*  CALCIUM 11.5* 10.9*  < > 10.8*  MG  --  2.4  --   --   AST 26  --   --   --   ALT 18  --   --   --   ALKPHOS 102  --   --   --   BILITOT 0.5  --   --   --   < > = values in this interval not displayed. ------------------------------------------------------------------------------------------------------------------  Cardiac Enzymes No results for input(s): TROPONINI in the last 168 hours. ------------------------------------------------------------------------------------------------------------------  RADIOLOGY:  US Renal  Result Date: 10/04/2016 CLINICAL DATA:  Acute renal failure EXAM: RENAL / URINARY TRACT ULTRASOUND COMPLETE COMPARISON:  None. FINDINGS: Right Kidney: Length: 9.5 cm. Echogenicity and renal cortical thickness are within normal limits. No perinephric fluid or hydronephrosis visualized. There is a cyst arising from the upper pole of the right kidney measuring 2.1 x 1.2 x 1.9 cm. There is a cyst arising from the lower pole of the right kidney measuring 1.6 x 1.6 x 1.7 cm. No sonographically demonstrable calculus or ureterectasis. Left Kidney: Length: 8.1 cm. Echogenicity and renal cortical thickness are within normal limits. No mass, perinephric fluid, or hydronephrosis visualized. No sonographically demonstrable calculus or ureterectasis. Bladder: There is debris evident in the urinary bladder. No well-defined mass appreciable by ultrasound in the urinary bladder.  Urinary bladder is moderately distended. IMPRESSION: The left kidney is somewhat small. This finding may be a function of age but also may be indicative of medical renal disease. The possibility of renal artery stenosis on the left this also be of concern. In this regard, question whether patient is hypertensive. Renal echogenicity and cortical thickness are within normal limits bilaterally. No obstructing foci identified in either kidney. There are simple cysts arising from the left  kidney. Debris in the urinary bladder raises concern for infection/cystitis. Moderate distention of the urinary bladder raises concern for potential degree of bladder outlet obstruction. Electronically Signed   By: Lowella Grip III M.D.   On: 10/04/2016 17:30   US Venous Img Upper Uni Right  Result Date: 10/05/2016 CLINICAL DATA:  82 year old with right arm swelling for a week. History of shoulder arthroplasty. EXAM: RIGHT UPPER EXTREMITY VENOUS DOPPLER ULTRASOUND TECHNIQUE: Gray-scale sonography with graded compression, as well as color Doppler and duplex ultrasound were performed to evaluate the upper extremity deep venous system from the level of the subclavian vein and including the jugular, axillary, basilic, radial, ulnar and upper cephalic vein. Spectral Doppler was utilized to evaluate flow at rest and with distal augmentation maneuvers. COMPARISON:  None. FINDINGS: Contralateral Subclavian Vein: Color Doppler flow and normal phasicity in the left subclavian vein without evidence of thrombus. Internal Jugular Vein: Normal compressibility and color Doppler flow in the right internal jugular vein. Subclavian Vein: Color Doppler flow and phasicity in the right subclavian vein without evidence of thrombus. Axillary Vein: Patient refused imaging of the right axilla. Cephalic Vein: Normal compressibility and color Doppler flow in the right cephalic vein. Basilic Vein: The right basilic vein is non compressible with echogenic thrombus. Brachial Veins: One brachial vein is noncompressible with echogenic thrombus. The other right brachial vein appears to be patent. Radial Veins: Renal veins appear to be compressible without thrombus. Ulnar Veins: Ulnar veins appear to be compressible without thrombus. IMPRESSION: Positive for deep venous thrombosis and superficial venous thrombosis in the right upper extremity. There is thrombus within a right brachial vein and there is thrombus within the right basilic  vein. Study has technical limitations and the right axillary region was not imaged. Electronically Signed   By: Markus Daft M.D.   On: 10/05/2016 17:28     ASSESSMENT AND PLAN:   82 year old female with a history of moderate dementia and recent right shoulder replacement who presents with abnormal findings on laboratory.  1. Severe hypernatremia in the setting of dehydration and dementia: Improved with D5, appreciated Dr. Holley Raring, nephrology consultation.  2. Acute kidney injury: continue D5 iv, f/u BMP.  * Hyperkalemia due to K supplement, discontinued KCl. F/u BMP.  3. Hypokalemia: Potassium is provided with IV fluids, improved but hyperkalemia.  4. UTI: discontinue ciprofloxacin  since urine culture result shows E Coli resistant to cipro and sensitive to rocephin. But allergy to rocephin.  5. Dementia: Continue Aricept and dysphagia 2 diet per speech evaluation.  6. Hypertension: Continue Norvasc  7. Recent right shoulder arthroplasty:  reasonable to initiate some physical occupational therapy while she is here per Dr. Roland Rack. orthopedic surgery.  Right arm DVT per Venous US. Started Eliquis.  CODE STATUS: FULL  TOTAL TIME TAKING CARE OF THIS PATIENT: 33 minutes.   POSSIBLE D/C 2 days, DEPENDING ON CLINICAL CONDITION.   Demetrios Loll M.D on 10/06/2016 at 1:40 PM  Between 7am to 6pm - Pager - [phone] After 6pm go to [email or web address] - [place]  [place]  [phone]  CC: Primary care physician; Loura Pardon, MD  Note: This dictation was prepared with Dragon dictation along with smaller phrase technology. Any transcriptional errors that result from this process are unintentional.

## 2016-10-06 NOTE — Progress Notes (Signed)
[facility]  ROUNDING NOTE   Subjective:  Sodium has now normalized to 136. Potassium 4.4. Creatinine about the same at 1.37.   Objective:  Vital signs in last 24 hours:  Temp:  [98.1 F (36.7 C)-98.7 F (37.1 C)] 98.7 F (37.1 C) (11/30 1418) Pulse Rate:  [77-110] 110 (11/30 1418) Resp:  [14-19] 19 (11/30 1418) BP: (112-146)/(55-99) 137/85 (11/30 1418) SpO2:  [87 %-98 %] 98 % (11/30 1418)  Weight change:  Filed Weights   10/03/16 1852  Weight: 42.6 kg (94 lb)    Intake/Output: I/O last 3 completed shifts: In: [id number] [P.O.:240; I.V.:4449; IV Piggyback:200] Out: 320 [Urine:320]   Intake/Output this shift:  Total I/O In: 240 [P.O.:240] Out: 1200 [Urine:1200]  Physical Exam: General: Cachetic  Head: Normocephalic, atraumatic.Dry oral mucosal membranes  Eyes: Anicteric  Neck: Supple, trachea midline  Lungs:  Clear to auscultation, normal effort  Heart: S1S2 no rubs  Abdomen:  Soft, nontender, bowel sounds present  Extremities: no peripheral edema.  Right shoulder incision  Neurologic: Awake, alert, not folllowing commands consistently  Skin: No lesions       Basic Metabolic Panel:  Recent Labs Lab 10/03/16 1924 10/04/16 0354 10/05/16 0457 10/06/16 0359  NA 160* 157* 148* 136  K 3.3* 3.1* 5.4* 4.4  CL 126* 126* 121* 108  CO2 28 29 24 24   GLUCOSE 103* 127* 136* 168*  BUN 38* 33* 29* 22*  CREATININE 1.54* 1.25* 1.32* 1.37*  CALCIUM 11.5* 10.9* 11.4* 10.8*  MG  --  2.4  --   --   PHOS  --  2.5  --   --     Liver Function Tests:  Recent Labs Lab 10/03/16 1924  AST 26  ALT 18  ALKPHOS 102  BILITOT 0.5  PROT 6.5  ALBUMIN 3.1*   No results for input(s): LIPASE, AMYLASE in the last 168 hours. No results for input(s): AMMONIA in the last 168 hours.  CBC:  Recent Labs Lab 10/03/16 1924 10/04/16 0354  WBC 12.7* 10.4  NEUTROABS 10.5*  --   HGB 11.4* 10.3*  HCT 35.4 31.1*  MCV 96.7 95.6  PLT 377 311    Cardiac Enzymes: No  results for input(s): CKTOTAL, CKMB, CKMBINDEX, TROPONINI in the last 168 hours.  BNP: Invalid input(s): POCBNP  CBG: No results for input(s): GLUCAP in the last 168 hours.  Microbiology: Results for orders placed or performed during the hospital encounter of 10/03/16  Urine culture     Status: Abnormal   Collection Time: 10/03/16  7:32 PM  Result Value Ref Range Status   Specimen Description URINE, RANDOM  Final   Special Requests NONE  Final   Culture >=100,000 COLONIES/mL ESCHERICHIA COLI (A)  Final   Report Status 10/06/2016 FINAL  Final   Organism ID, Bacteria ESCHERICHIA COLI (A)  Final      Susceptibility   Escherichia coli - MIC*    AMPICILLIN 8 SENSITIVE Sensitive     CEFAZOLIN <=4 SENSITIVE Sensitive     CEFTRIAXONE <=1 SENSITIVE Sensitive     CIPROFLOXACIN >=4 RESISTANT Resistant     GENTAMICIN <=1 SENSITIVE Sensitive     IMIPENEM <=0.25 SENSITIVE Sensitive     NITROFURANTOIN <=16 SENSITIVE Sensitive     TRIMETH/SULFA >=320 RESISTANT Resistant     AMPICILLIN/SULBACTAM <=2 SENSITIVE Sensitive     PIP/TAZO <=4 SENSITIVE Sensitive     Extended ESBL NEGATIVE Sensitive     * >=100,000 COLONIES/mL ESCHERICHIA COLI  MRSA PCR Screening  Status: None   Collection Time: 10/04/16 12:55 AM  Result Value Ref Range Status   MRSA by PCR NEGATIVE NEGATIVE Final    Comment:        The GeneXpert MRSA Assay (FDA approved for NASAL specimens only), is one component of a comprehensive MRSA colonization surveillance program. It is not intended to diagnose MRSA infection nor to guide or monitor treatment for MRSA infections.     Coagulation Studies: No results for input(s): LABPROT, INR in the last 72 hours.  Urinalysis:  Recent Labs  10/03/16 1932  COLORURINE YELLOW*  LABSPEC 1.014  PHURINE 5.0  GLUCOSEU NEGATIVE  HGBUR 2+*  BILIRUBINUR NEGATIVE  KETONESUR NEGATIVE  PROTEINUR 30*  NITRITE POSITIVE*  LEUKOCYTESUR 3+*      Imaging: US Renal  Result Date:  10/04/2016 CLINICAL DATA:  Acute renal failure EXAM: RENAL / URINARY TRACT ULTRASOUND COMPLETE COMPARISON:  None. FINDINGS: Right Kidney: Length: 9.5 cm. Echogenicity and renal cortical thickness are within normal limits. No perinephric fluid or hydronephrosis visualized. There is a cyst arising from the upper pole of the right kidney measuring 2.1 x 1.2 x 1.9 cm. There is a cyst arising from the lower pole of the right kidney measuring 1.6 x 1.6 x 1.7 cm. No sonographically demonstrable calculus or ureterectasis. Left Kidney: Length: 8.1 cm. Echogenicity and renal cortical thickness are within normal limits. No mass, perinephric fluid, or hydronephrosis visualized. No sonographically demonstrable calculus or ureterectasis. Bladder: There is debris evident in the urinary bladder. No well-defined mass appreciable by ultrasound in the urinary bladder. Urinary bladder is moderately distended. IMPRESSION: The left kidney is somewhat small. This finding may be a function of age but also may be indicative of medical renal disease. The possibility of renal artery stenosis on the left this also be of concern. In this regard, question whether patient is hypertensive. Renal echogenicity and cortical thickness are within normal limits bilaterally. No obstructing foci identified in either kidney. There are simple cysts arising from the left kidney. Debris in the urinary bladder raises concern for infection/cystitis. Moderate distention of the urinary bladder raises concern for potential degree of bladder outlet obstruction. Electronically Signed   By: Lowella Grip III M.D.   On: 10/04/2016 17:30   US Venous Img Upper Uni Right  Result Date: 10/05/2016 CLINICAL DATA:  82 year old with right arm swelling for a week. History of shoulder arthroplasty. EXAM: RIGHT UPPER EXTREMITY VENOUS DOPPLER ULTRASOUND TECHNIQUE: Gray-scale sonography with graded compression, as well as color Doppler and duplex ultrasound were  performed to evaluate the upper extremity deep venous system from the level of the subclavian vein and including the jugular, axillary, basilic, radial, ulnar and upper cephalic vein. Spectral Doppler was utilized to evaluate flow at rest and with distal augmentation maneuvers. COMPARISON:  None. FINDINGS: Contralateral Subclavian Vein: Color Doppler flow and normal phasicity in the left subclavian vein without evidence of thrombus. Internal Jugular Vein: Normal compressibility and color Doppler flow in the right internal jugular vein. Subclavian Vein: Color Doppler flow and phasicity in the right subclavian vein without evidence of thrombus. Axillary Vein: Patient refused imaging of the right axilla. Cephalic Vein: Normal compressibility and color Doppler flow in the right cephalic vein. Basilic Vein: The right basilic vein is non compressible with echogenic thrombus. Brachial Veins: One brachial vein is noncompressible with echogenic thrombus. The other right brachial vein appears to be patent. Radial Veins: Renal veins appear to be compressible without thrombus. Ulnar Veins: Ulnar veins appear to be compressible  without thrombus. IMPRESSION: Positive for deep venous thrombosis and superficial venous thrombosis in the right upper extremity. There is thrombus within a right brachial vein and there is thrombus within the right basilic vein. Study has technical limitations and the right axillary region was not imaged. Electronically Signed   By: Markus Daft M.D.   On: 10/05/2016 17:28     Medications:   . dextrose 75 mL/hr at 10/06/16 1008   . amLODipine  5 mg Oral Daily  . apixaban  10 mg Oral BID   Followed by  . [START ON 10/12/2016] apixaban  5 mg Oral BID  . cholecalciferol  1,000 Units Oral Daily  . docusate  100 mg Oral BID  . donepezil  10 mg Oral QHS  . feeding supplement (ENSURE ENLIVE)  237 mL Oral TID BM  . meropenem (MERREM) IV  500 mg Intravenous Q12H  . sodium chloride flush  3 mL  Intravenous Q12H   acetaminophen **OR** acetaminophen, bisacodyl, magnesium citrate, ondansetron **OR** ondansetron (ZOFRAN) IV, oxyCODONE, senna-docusate, traMADol, zolpidem  Assessment/ Plan:  82 y.o. female  with a PMHx of allergic rhinitis, right breast cancer, GERD, hyperlipidemia, osteoporosis, constipation varicose veins, dementia, who was admitted to [facility] on 10/03/2016 for evaluation of hypernatremia.    1.  Acute renal failure due to severe dehydration. 2.  Severe hypernatremia. 3.  Hypokalemia. 4.  Hypercalcemia.   5.  Possible bladder outlet obstruction. 6.  UTI.  Plan:  Serum sodium is now normal at 136.  Potassium was also normalized at 4.4.  Patient appears to be resting comfortably in bed.  We will switch fluids to half-normal saline now that sodium has improved.  Awaiting SPEP and UPEP.  Continue to monitor status.   LOS: 3 Deborah Espinoza 11/30/20173:04 PM

## 2016-10-06 NOTE — Progress Notes (Signed)
NT assisting feeding at bedside. Writer called by CCMD and notified of HR in 70s. Upon arriving to bedside, patient appeared to be choking on orange juice. Oral suction performed. Patient stabilized. SLP made aware. MD made aware. Madlyn Frankel, RN

## 2016-10-06 NOTE — Progress Notes (Signed)
Pharmacy Antibiotic Note  Deborah Espinoza is a 82 y.o. female admitted on 10/03/2016 with   UTI.  Pharmacy has been consulted for antibiotic dosing.  Patient has been receiving 3 days of ciprofloxacin. Patient has unknown allergy to PCN and cephalexin. Patient unable to answer questions and husband is unsure of allergy. UCx showing resistance to Cipro, Bactrim, and patient not a candidiate for nitrofurantion.  Plan: Will start patient on meropenem 500mg  IV every 12 hours.  Monitor patients for Signs/Sx of hypersensitivity rxn.   Height: 5' (152.4 cm) Weight: 94 lb (42.6 kg) IBW/kg (Calculated) : 45.5  Temp (24hrs), Avg:98.3 F (36.8 C), Min:98.1 F (36.7 C), Max:98.5 F (36.9 C)   Recent Labs Lab 10/03/16 1924 10/04/16 0354 10/05/16 0457 10/06/16 0359  WBC 12.7* 10.4  --   --   CREATININE 1.54* 1.25* 1.32* 1.37*    Estimated Creatinine Clearance: 21.7 mL/min (by C-G formula based on SCr of 1.37 mg/dL (H)).    Allergies  Allergen Reactions  . Cephalexin   . Clarithromycin   . Codeine   . Hydrocodone   . Penicillins   . Risedronate Sodium     Antimicrobials this admission: 11/27 ciprofloxacin >> 11/30 11/30 meropenem >>   Dose adjustments this admission:  Microbiology results: 11/27 UCx: >100,000 E. Coli, Resistant to Ciprofloxacin, TMP/SMX 11/28 MRSA PCR: negative   Thank you for allowing pharmacy to be a part of this patient's care.  Pernell Dupre, PharmD, BCPS Clinical Pharmacist 10/06/2016 2:19 PM

## 2016-10-06 NOTE — Progress Notes (Signed)
Daily Progress Note   Patient Name: Deborah Espinoza       Date: 10/06/2016 DOB: 1933/06/02  Age: 82 y.o. MRN#: [id number] Attending Physician: Demetrios Loll, MD Primary Care Physician: Loura Pardon, MD Admit Date: 10/03/2016  Reason for Consultation/Follow-up: Establishing goals of care  Subjective: Patient wakes to voice but not answering questions or following commands. Per RN, she had two episodes of choking while being fed and went bradycardic, down to 30's. She is not participating in physical or occupational therapy. Attempted to meet again with her husband but he did not show. Attempted to call husband but no answer.   Length of Stay: 3  Current Medications: Scheduled Meds:  . amLODipine  5 mg Oral Daily  . apixaban  10 mg Oral BID   Followed by  . [START ON 10/12/2016] apixaban  5 mg Oral BID  . cholecalciferol  1,000 Units Oral Daily  . docusate  100 mg Oral BID  . donepezil  10 mg Oral QHS  . feeding supplement (ENSURE ENLIVE)  237 mL Oral TID BM  . meropenem (MERREM) IV  500 mg Intravenous Q12H  . sodium chloride flush  3 mL Intravenous Q12H    Continuous Infusions: . sodium chloride      PRN Meds: acetaminophen **OR** acetaminophen, bisacodyl, magnesium citrate, ondansetron **OR** ondansetron (ZOFRAN) IV, oxyCODONE, senna-docusate, traMADol, zolpidem  Physical Exam  Constitutional: She is easily aroused.  Opens eyes but no speech and does not follow commands  Cardiovascular: Regular rhythm and normal heart sounds.   Pulmonary/Chest: Effort normal. She has decreased breath sounds.  Abdominal: Soft. Bowel sounds are decreased.  Neurological: She is easily aroused. She is disoriented.  Skin: Skin is warm and dry. There is pallor.  Psychiatric: She is withdrawn.  Cognition and memory are impaired. She is inattentive.  Nursing note and vitals reviewed.          Vital Signs: BP 137/85 (BP Location: Left Leg)   Pulse (!) 110   Temp 98.7 F (37.1 C) (Axillary)   Resp 19   Ht 5' (1.524 m)   Wt 42.6 kg (94 lb)   SpO2 98%   BMI 18.36 kg/m  SpO2: SpO2: 98 % O2 Device: O2 Device: Not Delivered O2 Flow Rate:    Intake/output summary:   Intake/Output Summary (Last 24  hours) at 10/06/16 1528 Last data filed at 10/06/16 1300  Gross per 24 hour  Intake             2528 ml  Output             1200 ml  Net             1328 ml   LBM: Last BM Date: 10/06/16 Baseline Weight: Weight: 42.6 kg (94 lb) Most recent weight: Weight: 42.6 kg (94 lb)       Palliative Assessment/Data: PPS 20%    Patient Active Problem List   Diagnosis Date Noted  . Hypernatremia 10/03/2016  . Status post reverse total shoulder replacement, right 09/19/2016  . Shoulder fracture 09/13/2016  . Pressure injury of skin 09/13/2016  . Shoulder fracture, right, closed, initial encounter 09/12/2016  . Hyperparathyroidism, primary ([place]) 05/17/2015  . Abnormal TSH 05/08/2014  . Leukocytosis 05/08/2014  . Hypokalemia 11/21/2012  . hx: breast cancer, right, DCIS, receptor + 11/28/2011  . Neuropathy ([place]) 09/27/2011  . NECK SPRAIN AND STRAIN 11/06/2010  . VARICOSE VEINS LOWER EXTREMITIES W/OTH COMPS 05/05/2010  . HEMORRHOIDS, EXTERNAL 01/06/2010  . Dementia without behavioral disturbance 07/23/2009  . PERIPHERAL EDEMA 04/03/2009  . Essential hypertension 01/22/2009  . DECREASED HEARING 09/02/2008  . CONSTIPATION, CHRONIC 04/21/2008  . HYPERLIPIDEMIA 08/30/2007  . COLONIC POLYPS, HX OF 08/30/2007  . ALLERGIC RHINITIS 08/06/2007  . LICHEN NOS [id number]  . Osteoporosis 08/06/2007    Palliative Care Assessment & Plan   Patient Profile: 82 y.o. female  with past medical history of dementia, hyperlipidemia, breast cancer, GERD, colon polyps, and falls admitted on  10/03/2016 with abnormal labs-sodium of 167. She had a recent fall with hospitalization s/p reverse right total shoulder arthroplasty on 09/19/16. She then went to [facility] for rehab. Diagnosed with acute kidney injury and severe hypernatremia in the setting of dehydration and dementia. Also positive for UTI. She is receiving D5@75  and IV cipro. Nephrology following. Speech therapy has evaluated and recommending dysphagia 2, thin liquids. Physical therapy consulted but patient not participating in therapy. Palliative medicine consultation for goals of care.    Assessment: Dementia Dehydration Hypernatremia Hypokalemia Acute kidney injury UTI Recent right shoulder arthroplasty DVT Failure to thrive  Recommendations/Plan:  Attempted to meet with husband at bedside to further discuss [place] and code status but unfortunately he did not show.   I feel patient is eligible for residential hospice. Despite aggressive medical interventions, she has shown minimal improvement in cognition, functional status, and nutritional status and with overall failure to thrive.   PMT not at [facility] over the weekend and unable to f/u until Monday if patient is still hospitalized.   Goals of Care and Additional Recommendations:  Limitations on Scope of Treatment: Full Scope Treatment  Code Status: FULL   Code Status Orders        Start     Ordered   10/04/16 0242  Full code  Continuous     10/04/16 0241    Code Status History    Date Active Date Inactive Code Status Order ID Comments User Context   09/19/2016  6:02 PM 09/22/2016 10:00 PM Full Code [id number]  Corky Mull, MD Inpatient   09/12/2016  8:41 PM 09/16/2016  1:20 AM Full Code [id number]  Nicholes Mango, MD ED       Prognosis:   Unable to determine guarded-patient with dementia, severe functional and nutritional status decline, two aspiration episodes with bradycardia down to 30's, and  overall failure to thrive.    Discharge Planning:  To  Be Determined  Care plan was discussed with RN and Dr. Bridgett Larsson  Thank you for allowing the Palliative Medicine Team to assist in the care of this patient.   Time In: 1500 Time Out: 1525 Total Time 46min Prolonged Time Billed  no       Greater than 50%  of this time was spent counseling and coordinating care related to the above assessment and plan.  Ihor Dow, FNP-C Palliative Medicine Team  Phone: [phone] Fax: [phone]  Please contact Palliative Medicine Team phone at [phone] for questions and concerns.

## 2016-10-06 NOTE — Progress Notes (Signed)
MD made aware of telemetry changes (see strip). Continue to monitor per Dr. Bridgett Larsson. Madlyn Frankel, RN

## 2016-10-07 DIAGNOSIS — Z515 Encounter for palliative care: Secondary | ICD-10-CM

## 2016-10-07 DIAGNOSIS — R627 Adult failure to thrive: Secondary | ICD-10-CM

## 2016-10-07 DIAGNOSIS — F0390 Unspecified dementia without behavioral disturbance: Secondary | ICD-10-CM

## 2016-10-07 DIAGNOSIS — Z66 Do not resuscitate: Secondary | ICD-10-CM

## 2016-10-07 LAB — BASIC METABOLIC PANEL
Anion gap: 5 (ref 5–15)
BUN: 17 mg/dL (ref 6–20)
CALCIUM: 10.5 mg/dL — AB (ref 8.9–10.3)
CO2: 25 mmol/L (ref 22–32)
CREATININE: 1.15 mg/dL — AB (ref 0.44–1.00)
Chloride: 110 mmol/L (ref 101–111)
GFR calc non Af Amer: 43 mL/min — ABNORMAL LOW (ref >60)
GFR, EST AFRICAN AMERICAN: 50 mL/min — AB (ref >60)
Glucose, Bld: 100 mg/dL — ABNORMAL HIGH (ref 65–99)
Potassium: 3.9 mmol/L (ref 3.5–5.1)
SODIUM: 140 mmol/L (ref 135–145)

## 2016-10-07 MED ORDER — APIXABAN 5 MG PO TABS: 10.0000 mg | ORAL_TABLET | Freq: Two times a day (BID) | ORAL | 0 refills | Status: AC

## 2016-10-07 MED ORDER — CEPHALEXIN 500 MG PO CAPS: 500.0000 mg | ORAL_CAPSULE | Freq: Three times a day (TID) | ORAL | 0 refills | Status: DC

## 2016-10-07 MED ORDER — MEROPENEM-SODIUM CHLORIDE 500 MG/50ML IV SOLR
500.0000 mg | Freq: Two times a day (BID) | INTRAVENOUS | Status: DC
  Filled 2016-10-07: qty 50

## 2016-10-07 MED ORDER — CEFTRIAXONE SODIUM-DEXTROSE 1-3.74 GM-% IV SOLR
1.0000 g | INTRAVENOUS | Status: AC
  Administered 2016-10-07: 1 g via INTRAVENOUS
  Filled 2016-10-07: qty 50

## 2016-10-07 MED ORDER — ENSURE ENLIVE PO LIQD: 237.0000 mL | Freq: Three times a day (TID) | ORAL | 12 refills | Status: DC

## 2016-10-07 MED ORDER — APIXABAN 5 MG PO TABS: 5.0000 mg | ORAL_TABLET | Freq: Two times a day (BID) | ORAL | Status: DC

## 2016-10-07 NOTE — Progress Notes (Signed)
Pharmacy Antibiotic Note  Deborah Espinoza is a 82 y.o. female admitted on 10/03/2016 with   UTI.  Pharmacy has been consulted for antibiotic dosing.  Patient has been receiving 3 days of ciprofloxacin. Patient has unknown allergy to PCN and cephalexin. Patient unable to answer questions and husband is unsure of allergy. UCx showing resistance to Cipro, Bactrim, and patient not a candidiate for nitrofurantion.  Plan: Dr. Ola Spurr would like to try 1 dose of Ceftriaxone and see how patient reacts. If no problems, MD will consider sending home on oral cephalosporin.(Per discussion with Dr. Bridgett Larsson who had discussion with Dr. Ola Spurr).  Called RN to make aware and to monitor patient for Signs/Sx of hypersensitivity rxn.     Height: 5' (152.4 cm) Weight: 94 lb (42.6 kg) IBW/kg (Calculated) : 45.5  Temp (24hrs), Avg:98.4 F (36.9 C), Min:97.7 F (36.5 C), Max:99.2 F (37.3 C)   Recent Labs Lab 10/03/16 1924 10/04/16 0354 10/05/16 0457 10/06/16 0359 10/07/16 0424  WBC 12.7* 10.4  --   --   --   CREATININE 1.54* 1.25* 1.32* 1.37* 1.15*    Estimated Creatinine Clearance: 25.8 mL/min (by C-G formula based on SCr of 1.15 mg/dL (H)).    Allergies  Allergen Reactions  . Cephalexin   . Clarithromycin   . Codeine   . Hydrocodone   . Penicillins   . Risedronate Sodium     Antimicrobials this admission: 11/27 ciprofloxacin >> 11/30 11/30 meropenem >> 12/1 CTX x1 12/1  Dose adjustments this admission:  Microbiology results: 11/27 UCx: >100,000 E. Coli, Resistant to Ciprofloxacin, TMP/SMX 11/28 MRSA PCR: negative   Thank you for allowing pharmacy to be a part of this patient's care.  Noralee Space, PharmD, BCPS Clinical Pharmacist 10/07/2016 2:21 PM

## 2016-10-07 NOTE — Progress Notes (Signed)
PT Cancellation Note  Patient Details Name: Deborah Espinoza MRN: [id number] DOB: [birth year]   Cancelled Treatment:    Reason Eval/Treat Not Completed: Other (comment).  Per RN pt not following commands and remains lethargic.  Will hold PT until pt more appropriate and able to work with PT.   Collie Siad PT, DPT 10/07/2016, 11:46 AM

## 2016-10-07 NOTE — Progress Notes (Signed)
Pharmacy Antibiotic Note  Deborah Espinoza is a 82 y.o. female admitted on 10/03/2016 with   UTI.  Pharmacy has been consulted for antibiotic dosing.  Patient has been receiving 3 days of ciprofloxacin. Patient has unknown allergy to PCN and cephalexin. Patient unable to answer questions and husband is unsure of allergy. UCx showing resistance to Cipro, Bactrim, and patient not a candidiate for nitrofurantion.  Plan: Will  Continue patient on meropenem 500mg  IV every 12 hours.  Monitor patients for Signs/Sx of hypersensitivity rxn.   Height: 5' (152.4 cm) Weight: 94 lb (42.6 kg) IBW/kg (Calculated) : 45.5  Temp (24hrs), Avg:98.5 F (36.9 C), Min:97.7 F (36.5 C), Max:99.2 F (37.3 C)   Recent Labs Lab 10/03/16 1924 10/04/16 0354 10/05/16 0457 10/06/16 0359 10/07/16 0424  WBC 12.7* 10.4  --   --   --   CREATININE 1.54* 1.25* 1.32* 1.37* 1.15*    Estimated Creatinine Clearance: 25.8 mL/min (by C-G formula based on SCr of 1.15 mg/dL (H)).    Allergies  Allergen Reactions  . Cephalexin   . Clarithromycin   . Codeine   . Hydrocodone   . Penicillins   . Risedronate Sodium     Antimicrobials this admission: 11/27 ciprofloxacin >> 11/30 11/30 meropenem >>   Dose adjustments this admission:  Microbiology results: 11/27 UCx: >100,000 E. Coli, Resistant to Ciprofloxacin, TMP/SMX 11/28 MRSA PCR: negative   Thank you for allowing pharmacy to be a part of this patient's care.  Noralee Space, PharmD, BCPS Clinical Pharmacist 10/07/2016 12:01 PM

## 2016-10-07 NOTE — Discharge Instructions (Signed)
Dysphagia 2 diet. Aspiration and fall precaution.

## 2016-10-07 NOTE — Consult Note (Signed)
[facility] Infectious Disease     Reason for Consult: UTI, drug allergries    Referring Physician: Estanislado Spire Date of Admission:  10/03/2016   Active Problems:   Hypernatremia   Palliative care encounter   Adult failure to thrive   HPI: Deborah Espinoza is a 82 y.o. female admitted with AKI, dehydration and UTI. She had TNTC wbc on UA and started cipro. However E coli grew R to cipro so started meropenem. Has Unknown reaction to pcns and cephalosporins Pt has advanced dementia as well as recent fall and shoulder replacement.   Past Medical History:  Diagnosis Date  . Allergic rhinitis, cause unspecified   . Breast cancer ([facility])    right  . Cataracts, bilateral    complications from eye surgery  . Dizziness and giddiness   . Edema   . Elevated blood pressure reading without diagnosis of hypertension   . External hemorrhoids without mention of complication   . GERD (gastroesophageal reflux disease)   . Lichen, unspecified    sclerosis  . Memory loss   . Mixed hyperlipidemia   . Osteoporosis, unspecified   . Other constipation   . Personal history of colonic polyps   . Sprain of neck   . Unspecified constipation   . Unspecified essential hypertension   . Unspecified hearing loss   . Urticaria, unspecified   . Varicose veins of lower extremities with other complications    Past Surgical History:  Procedure Laterality Date  . BILATERAL OOPHORECTOMY     bilateral  . BREAST BIOPSY  12/07   pos ductal carcinoma in situ  . BREAST LUMPECTOMY  2008   RIGHT  . CATARACT EXTRACTION  04/2003  . COLONOSCOPY  11/02   polyp  . COLONOSCOPY  09/2004   polyps  . COLONOSCOPY  11/08   adenomatous polyps  . DEXA  2001   Osteopenia  . DEXA     improved OP  . DEXA  11/2004   OP  . DEXA  2/09   OP T score-3.0 spine and -2.5 in fem neck  . nuclear stress  05/2007   negative  . ovarian tumor  11/04   benign  . PARTIAL HYSTERECTOMY  1987   fibroids  . REVERSE SHOULDER ARTHROPLASTY  Right 09/19/2016   Procedure: REVERSE RIGHT TOTAL SHOULDER ARTHROPLASTY;  Surgeon: Corky Mull, MD;  Location: [facility] ORS;  Service: Orthopedics;  Laterality: Right;  . stress perfusion cardiac study  7/22008   Normal with normal EF   Social History  Substance Use Topics  . Smoking status: Never Smoker  . Smokeless tobacco: Never Used  . Alcohol use No   Family History  Problem Relation Age of Onset  . Brain cancer Father   . Diabetes Father   . Kidney cancer Mother     renal cell  . Cancer Mother   . Diabetes Brother   . Diabetes Brother   . Colon cancer Brother   . Alzheimer's disease      Aunt    Allergies:  Allergies  Allergen Reactions  . Cephalexin   . Clarithromycin   . Codeine   . Hydrocodone   . Penicillins   . Risedronate Sodium     Current antibiotics: Antibiotics Given (last 72 hours)    Date/Time Action Medication Dose Rate   10/04/16 1825 Given   ciprofloxacin (CIPRO) IVPB 400 mg 400 mg 200 mL/hr   10/05/16 1709 Given   ciprofloxacin (CIPRO) IVPB 400 mg 400  mg 200 mL/hr   10/06/16 1810 Given   meropenem (MERREM) 500 mg in sodium chloride 0.9 % 50 mL IVPB 500 mg 100 mL/hr   10/07/16 0549 Given  [pt preference]   meropenem (MERREM) 500 mg in sodium chloride 0.9 % 50 mL IVPB 500 mg 100 mL/hr   10/07/16 1430 Given   cefTRIAXone (ROCEPHIN) IVPB 1 g 1 g 100 mL/hr      MEDICATIONS: . amLODipine  5 mg Oral Daily  . apixaban  10 mg Oral BID   Followed by  . [START ON 10/12/2016] apixaban  5 mg Oral BID  . cefTRIAXone  1 g Intravenous STAT  . cholecalciferol  1,000 Units Oral Daily  . docusate  100 mg Oral BID  . donepezil  10 mg Oral QHS  . feeding supplement (ENSURE ENLIVE)  237 mL Oral TID BM  . sodium chloride flush  3 mL Intravenous Q12H    Review of Systems - unable to obtain   OBJECTIVE: Temp:  [97.7 F (36.5 C)-99.2 F (37.3 C)] 97.7 F (36.5 C) (12/01 0522) Pulse Rate:  [87-99] 96 (12/01 1307) Resp:  [16-18] 18 (12/01 1307) BP:  (113-133)/(49-70) 120/52 (12/01 1307) SpO2:  [97 %-100 %] 98 % (12/01 1307) Physical Exam  Constitutional:  Frail, lying in bed, arm in sling HENT: [place]/AT, PERRLA, no scleral icterus Mouth/Throat: Oropharynx is clear and dry.  Cardiovascular: Normal rate, regular rhythm and normal heart sounds. Pulmonary/Chest: rhonchi Neck = supple, no nuchal rigidity Abdominal: Soft. Bowel sounds are normal.  exhibits no distension. There is no tenderness.  Lymphadenopathy: no cervical adenopathy. No axillary adenopathy Neurological: dementia Skin: Skin is warm and dry. No rash noted. No erythema.   LABS: Results for orders placed or performed during the hospital encounter of 10/03/16 (from the past 48 hour(s))  Basic metabolic panel     Status: Abnormal   Collection Time: 10/06/16  3:59 AM  Result Value Ref Range   Sodium 136 135 - 145 mmol/L   Potassium 4.4 3.5 - 5.1 mmol/L   Chloride 108 101 - 111 mmol/L   CO2 24 22 - 32 mmol/L   Glucose, Bld 168 (H) 65 - 99 mg/dL   BUN 22 (H) 6 - 20 mg/dL   Creatinine, Ser 1.37 (H) 0.44 - 1.00 mg/dL   Calcium 10.8 (H) 8.9 - 10.3 mg/dL   GFR calc non Af Amer 35 (L) >60 mL/min   GFR calc Af Amer 41 (L) >60 mL/min    Comment: (NOTE) The eGFR has been calculated using the CKD EPI equation. This calculation has not been validated in all clinical situations. eGFR's persistently <60 mL/min signify possible Chronic Kidney Disease.    Anion gap 4 (L) 5 - 15  Basic metabolic panel     Status: Abnormal   Collection Time: 10/07/16  4:24 AM  Result Value Ref Range   Sodium 140 135 - 145 mmol/L   Potassium 3.9 3.5 - 5.1 mmol/L   Chloride 110 101 - 111 mmol/L   CO2 25 22 - 32 mmol/L   Glucose, Bld 100 (H) 65 - 99 mg/dL   BUN 17 6 - 20 mg/dL   Creatinine, Ser 1.15 (H) 0.44 - 1.00 mg/dL   Calcium 10.5 (H) 8.9 - 10.3 mg/dL   GFR calc non Af Amer 43 (L) >60 mL/min   GFR calc Af Amer 50 (L) >60 mL/min    Comment: (NOTE) The eGFR has been calculated using the CKD EPI  equation. This calculation  has not been validated in all clinical situations. eGFR's persistently <60 mL/min signify possible Chronic Kidney Disease.    Anion gap 5 5 - 15   No components found for: ESR, C REACTIVE PROTEIN MICRO: Recent Results (from the past 720 hour(s))  Surgical pcr screen     Status: None   Collection Time: 09/12/16  9:03 PM  Result Value Ref Range Status   MRSA, PCR NEGATIVE NEGATIVE Final   Staphylococcus aureus NEGATIVE NEGATIVE Final    Comment:        The Xpert SA Assay (FDA approved for NASAL specimens in patients over 5 years of age), is one component of a comprehensive surveillance program.  Test performance has been validated by [facility] for patients greater than or equal to 45 year old. It is not intended to diagnose infection nor to guide or monitor treatment.   Urine culture     Status: None   Collection Time: 09/14/16  8:40 AM  Result Value Ref Range Status   Specimen Description URINE, CLEAN CATCH  Final   Special Requests NONE  Final   Culture NO GROWTH Performed at [facility]   Final   Report Status 09/15/2016 FINAL  Final  Surgical pcr screen     Status: None   Collection Time: 09/19/16 11:23 AM  Result Value Ref Range Status   MRSA, PCR NEGATIVE NEGATIVE Final   Staphylococcus aureus NEGATIVE NEGATIVE Final    Comment:        The Xpert SA Assay (FDA approved for NASAL specimens in patients over 105 years of age), is one component of a comprehensive surveillance program.  Test performance has been validated by [facility] for patients greater than or equal to 14 year old. It is not intended to diagnose infection nor to guide or monitor treatment.   Surgical PCR screen     Status: None   Collection Time: 09/19/16 12:54 PM  Result Value Ref Range Status   MRSA, PCR NEGATIVE NEGATIVE Final   Staphylococcus aureus NEGATIVE NEGATIVE Final    Comment:        The Xpert SA Assay (FDA approved for NASAL  specimens in patients over 48 years of age), is one component of a comprehensive surveillance program.  Test performance has been validated by [facility] for patients greater than or equal to 33 year old. It is not intended to diagnose infection nor to guide or monitor treatment.   Urine culture     Status: Abnormal   Collection Time: 09/19/16  2:00 PM  Result Value Ref Range Status   Specimen Description URINE, CATHETERIZED  Final   Special Requests vancomycin Normal  Final   Culture MULTIPLE SPECIES PRESENT, SUGGEST RECOLLECTION (A)  Final   Report Status 09/20/2016 FINAL  Final  CULTURE, BLOOD (ROUTINE X 2) w Reflex to ID Panel     Status: None   Collection Time: 09/21/16  1:28 AM  Result Value Ref Range Status   Specimen Description BLOOD  R AC  Final   Special Requests   Final    BOTTLES DRAWN AEROBIC AND ANAEROBIC  AER 8 ML ANA 4 ML   Culture NO GROWTH 5 DAYS  Final   Report Status 09/26/2016 FINAL  Final  CULTURE, BLOOD (ROUTINE X 2) w Reflex to ID Panel     Status: None   Collection Time: 09/21/16  1:41 AM  Result Value Ref Range Status   Specimen Description BLOOD  L HAND  Final   Special Requests   Final    BOTTLES DRAWN AEROBIC AND ANAEROBIC  AER 3 ML ANA 2 ML   Culture NO GROWTH 5 DAYS  Final   Report Status 09/26/2016 FINAL  Final  Urine culture     Status: Abnormal   Collection Time: 10/03/16  7:32 PM  Result Value Ref Range Status   Specimen Description URINE, RANDOM  Final   Special Requests NONE  Final   Culture >=100,000 COLONIES/mL ESCHERICHIA COLI (A)  Final   Report Status 10/06/2016 FINAL  Final   Organism ID, Bacteria ESCHERICHIA COLI (A)  Final      Susceptibility   Escherichia coli - MIC*    AMPICILLIN 8 SENSITIVE Sensitive     CEFAZOLIN <=4 SENSITIVE Sensitive     CEFTRIAXONE <=1 SENSITIVE Sensitive     CIPROFLOXACIN >=4 RESISTANT Resistant     GENTAMICIN <=1 SENSITIVE Sensitive     IMIPENEM <=0.25 SENSITIVE Sensitive     NITROFURANTOIN  <=16 SENSITIVE Sensitive     TRIMETH/SULFA >=320 RESISTANT Resistant     AMPICILLIN/SULBACTAM <=2 SENSITIVE Sensitive     PIP/TAZO <=4 SENSITIVE Sensitive     Extended ESBL NEGATIVE Sensitive     * >=100,000 COLONIES/mL ESCHERICHIA COLI  MRSA PCR Screening     Status: None   Collection Time: 10/04/16 12:55 AM  Result Value Ref Range Status   MRSA by PCR NEGATIVE NEGATIVE Final    Comment:        The GeneXpert MRSA Assay (FDA approved for NASAL specimens only), is one component of a comprehensive MRSA colonization surveillance program. It is not intended to diagnose MRSA infection nor to guide or monitor treatment for MRSA infections.     IMAGING: Dg Chest 2 View  Result Date: 09/21/2016 CLINICAL DATA:  Cough EXAM: CHEST  2 VIEW COMPARISON:  09/12/2016 FINDINGS: Decreased lung volume since the prior study. Mild bibasilar atelectasis. Negative for heart failure. Minimal pleural effusion. Right shoulder arthroplasty has been performed since the prior study. IMPRESSION: Hypoventilation with mild bibasilar atelectasis. Electronically Signed   By: Franchot Gallo M.D.   On: 09/21/2016 09:17   Dg Pelvis 1-2 Views  Result Date: 09/12/2016 CLINICAL DATA:  Bilateral hip pain after falls. EXAM: PELVIS - 1-2 VIEW COMPARISON:  None.- FINDINGS: Pelvic bony ring is intact. No gross abnormality to the hips. There is gas in the rectum. No evidence for a fracture. IMPRESSION: No acute bone abnormality. Electronically Signed   By: Markus Daft M.D.   On: 09/12/2016 16:53   Dg Shoulder Right  Result Date: 09/12/2016 CLINICAL DATA:  Right shoulder pain since a fall out of bed 3 days ago. EXAM: RIGHT SHOULDER - 2+ VIEW COMPARISON:  None. FINDINGS: The patient has an acute surgical neck fracture of the right shoulder with foreshortening of approximately 3.5 cm and 1 shaft with anterior displacement. The fracture appears to involve the greater tuberosity. The humeral head remains located. The  acromioclavicular joint is intact. No other acute abnormality is identified. IMPRESSION: Acute surgical neck fracture right humerus as described above. Electronically Signed   By: Inge Rise M.D.   On: 09/12/2016 19:08   Dg Knee 1-2 Views Left  Result Date: 09/12/2016 CLINICAL DATA:  Fall. EXAM: LEFT KNEE - 1-2 VIEW COMPARISON:  None. FINDINGS: No evidence of fracture, dislocation, or joint effusion. No evidence of arthropathy or other focal bone abnormality. Soft tissues are unremarkable. IMPRESSION: Negative. Electronically Signed   By: Jenness Corner.D.  On: 09/12/2016 16:53   Ct Head Wo Contrast  Result Date: 09/12/2016 CLINICAL DATA:  Dementia, multiple falls x4 days, altered mental status EXAM: CT HEAD WITHOUT CONTRAST CT CERVICAL SPINE WITHOUT CONTRAST TECHNIQUE: Multidetector CT imaging of the head and cervical spine was performed following the standard protocol without intravenous contrast. Multiplanar CT image reconstructions of the cervical spine were also generated. COMPARISON:  Cervical spine radiographs dated 11/09/2010 FINDINGS: CT HEAD FINDINGS Brain: No evidence of acute infarction, hemorrhage, hydrocephalus, extra-axial collection or mass lesion/mass effect. Vascular: No hyperdense vessel or unexpected calcification. Skull: Normal. Negative for fracture or focal lesion. Sinuses/Orbits: Visualized paranasal sinuses are essentially clear. Right mastoid air cells are opacified. Other: Global cortical atrophy.  No ventriculomegaly. Subcortical white matter and periventricular small vessel ischemic changes. Mild intracranial atherosclerosis. CT CERVICAL SPINE FINDINGS Alignment: Normal cervical lordosis. Skull base and vertebrae: No acute fracture. No primary bone lesion or focal pathologic process. Soft tissues and spinal canal: No prevertebral fluid or swelling. No visible canal hematoma. Disc levels:  Moderate degenerative changes at C5-6. Spinal canal remains patent. Upper chest:  Visualized lung apices are notable for biapical pleural-parenchymal scarring. Other: Visualized thyroid is unremarkable. IMPRESSION: No evidence of acute intracranial abnormality. Atrophy with small vessel ischemic changes. No evidence of traumatic injury to the cervical spine. Moderate degenerative changes at C5-6. Electronically Signed   By: Julian Hy M.D.   On: 09/12/2016 16:10   Ct Cervical Spine Wo Contrast  Result Date: 09/12/2016 CLINICAL DATA:  Dementia, multiple falls x4 days, altered mental status EXAM: CT HEAD WITHOUT CONTRAST CT CERVICAL SPINE WITHOUT CONTRAST TECHNIQUE: Multidetector CT imaging of the head and cervical spine was performed following the standard protocol without intravenous contrast. Multiplanar CT image reconstructions of the cervical spine were also generated. COMPARISON:  Cervical spine radiographs dated 11/09/2010 FINDINGS: CT HEAD FINDINGS Brain: No evidence of acute infarction, hemorrhage, hydrocephalus, extra-axial collection or mass lesion/mass effect. Vascular: No hyperdense vessel or unexpected calcification. Skull: Normal. Negative for fracture or focal lesion. Sinuses/Orbits: Visualized paranasal sinuses are essentially clear. Right mastoid air cells are opacified. Other: Global cortical atrophy.  No ventriculomegaly. Subcortical white matter and periventricular small vessel ischemic changes. Mild intracranial atherosclerosis. CT CERVICAL SPINE FINDINGS Alignment: Normal cervical lordosis. Skull base and vertebrae: No acute fracture. No primary bone lesion or focal pathologic process. Soft tissues and spinal canal: No prevertebral fluid or swelling. No visible canal hematoma. Disc levels:  Moderate degenerative changes at C5-6. Spinal canal remains patent. Upper chest: Visualized lung apices are notable for biapical pleural-parenchymal scarring. Other: Visualized thyroid is unremarkable. IMPRESSION: No evidence of acute intracranial abnormality. Atrophy with small  vessel ischemic changes. No evidence of traumatic injury to the cervical spine. Moderate degenerative changes at C5-6. Electronically Signed   By: Julian Hy M.D.   On: 09/12/2016 16:10   Ct Shoulder Right Wo Contrast  Result Date: 09/13/2016 CLINICAL DATA:  Status post fall 4 days ago with a right humerus fracture. Preoperative planning examination. EXAM: CT OF THE RIGHT SHOULDER WITHOUT CONTRAST TECHNIQUE: Multidetector CT imaging was performed according to the standard protocol. Multiplanar CT image reconstructions were also generated. COMPARISON:  Plain films right shoulder 09/12/2016. PA and lateral chest 05/08/2014. FINDINGS: As seen on the comparison plain films, the patient has a fracture of the surgical neck of the right humerus. The shaft of the humerus demonstrates anterior displacement of approximately 4.2 cm. The humeral head is rotated with the greater tuberosity directed toward the superior margin of the glenoid.  There is fragment override of approximately 2 cm. The fracture extends through the greater tuberosity most notable posteriorly where there is distraction of approximately 0.7 cm and mild comminution. The lesser tuberosity is spared. The humeral head remains located. The acromioclavicular joint is intact and unremarkable. The rotator cuff of and long head of biceps tendon appear intact. The patient has a severe compression fracture deformity of T7. Milder compression fracture of T8 is also identified. These fractures are remote and seen on the comparison plain films of the chest. Aortic atherosclerosis is identified. Imaged lung parenchyma is clear. IMPRESSION: Acute surgical neck fracture of the humerus with anterior displacement of approximately 4.2 cm and fragment override of approximately 2 cm. The fracture extends into the greater tuberosity where there is mild distraction and comminution. As noted above, the humeral head is rotated due to retraction by the rotator cuff with  the greater tuberosity directed toward the glenoid. Remote T7 and T8 compression fractures. Calcific aortic atherosclerosis. Electronically Signed   By: Inge Rise M.D.   On: 09/13/2016 13:16   US Renal  Result Date: 10/04/2016 CLINICAL DATA:  Acute renal failure EXAM: RENAL / URINARY TRACT ULTRASOUND COMPLETE COMPARISON:  None. FINDINGS: Right Kidney: Length: 9.5 cm. Echogenicity and renal cortical thickness are within normal limits. No perinephric fluid or hydronephrosis visualized. There is a cyst arising from the upper pole of the right kidney measuring 2.1 x 1.2 x 1.9 cm. There is a cyst arising from the lower pole of the right kidney measuring 1.6 x 1.6 x 1.7 cm. No sonographically demonstrable calculus or ureterectasis. Left Kidney: Length: 8.1 cm. Echogenicity and renal cortical thickness are within normal limits. No mass, perinephric fluid, or hydronephrosis visualized. No sonographically demonstrable calculus or ureterectasis. Bladder: There is debris evident in the urinary bladder. No well-defined mass appreciable by ultrasound in the urinary bladder. Urinary bladder is moderately distended. IMPRESSION: The left kidney is somewhat small. This finding may be a function of age but also may be indicative of medical renal disease. The possibility of renal artery stenosis on the left this also be of concern. In this regard, question whether patient is hypertensive. Renal echogenicity and cortical thickness are within normal limits bilaterally. No obstructing foci identified in either kidney. There are simple cysts arising from the left kidney. Debris in the urinary bladder raises concern for infection/cystitis. Moderate distention of the urinary bladder raises concern for potential degree of bladder outlet obstruction. Electronically Signed   By: Lowella Grip III M.D.   On: 10/04/2016 17:30   Dg Hand 2 View Right  Result Date: 09/12/2016 CLINICAL DATA:  82 year old female with a history of  fall. Arm pain. EXAM: RIGHT HAND - 2 VIEW COMPARISON:  None. FINDINGS: Osteopenia. Acute nondisplaced fracture at the base of the distal phalanx of the right thumb. Advanced osteoarthritis of the right hand.  No erosive changes. No radiopaque foreign body. IMPRESSION: Acute nondisplaced fracture at the base of the distal phalanx of the right thumb. Osteopenia and advanced osteoarthritis. Signed, Dulcy Fanny. Earleen Newport, DO [facility] Radiology Electronically Signed   By: Corrie Mckusick D.O.   On: 09/12/2016 16:52   US Venous Img Upper Uni Right  Result Date: 10/05/2016 CLINICAL DATA:  82 year old with right arm swelling for a week. History of shoulder arthroplasty. EXAM: RIGHT UPPER EXTREMITY VENOUS DOPPLER ULTRASOUND TECHNIQUE: Gray-scale sonography with graded compression, as well as color Doppler and duplex ultrasound were performed to evaluate the upper extremity deep venous  system from the level of the subclavian vein and including the jugular, axillary, basilic, radial, ulnar and upper cephalic vein. Spectral Doppler was utilized to evaluate flow at rest and with distal augmentation maneuvers. COMPARISON:  None. FINDINGS: Contralateral Subclavian Vein: Color Doppler flow and normal phasicity in the left subclavian vein without evidence of thrombus. Internal Jugular Vein: Normal compressibility and color Doppler flow in the right internal jugular vein. Subclavian Vein: Color Doppler flow and phasicity in the right subclavian vein without evidence of thrombus. Axillary Vein: Patient refused imaging of the right axilla. Cephalic Vein: Normal compressibility and color Doppler flow in the right cephalic vein. Basilic Vein: The right basilic vein is non compressible with echogenic thrombus. Brachial Veins: One brachial vein is noncompressible with echogenic thrombus. The other right brachial vein appears to be patent. Radial Veins: Renal veins appear to be compressible  without thrombus. Ulnar Veins: Ulnar veins appear to be compressible without thrombus. IMPRESSION: Positive for deep venous thrombosis and superficial venous thrombosis in the right upper extremity. There is thrombus within a right brachial vein and there is thrombus within the right basilic vein. Study has technical limitations and the right axillary region was not imaged. Electronically Signed   By: Markus Daft M.D.   On: 10/05/2016 17:28   Dg Chest Port 1 View  Result Date: 09/12/2016 CLINICAL DATA:  Multiple falls.  Humerus fracture. EXAM: PORTABLE CHEST 1 VIEW COMPARISON:  Right humerus 09/22/2016 and chest radiography 05/08/2014 FINDINGS: There is a displaced fracture of the right humeral neck. The distal humeral component is displaced medially. Right AC joint appears to be intact. Both lungs are clear. Negative for pneumothorax. Atherosclerotic calcifications at the aortic arch. Heart size is within normal limits. IMPRESSION: No acute chest abnormality. Displaced fracture of the right humeral neck. Aortic atherosclerosis. Electronically Signed   By: Markus Daft M.D.   On: 09/12/2016 17:04   Dg Shoulder Right Port  Result Date: 09/19/2016 CLINICAL DATA:  Post right shoulder replacement. EXAM: PORTABLE RIGHT SHOULDER COMPARISON:  09/12/2016 FINDINGS: Right total shoulder arthroplasty was performed. Skin staples are noted. Visualized right lung is clear. Right shoulder arthroplasty appears to be located. IMPRESSION: Right shoulder arthroplasty.  No complicating features. Electronically Signed   By: Markus Daft M.D.   On: 09/19/2016 17:17   Dg Humerus Right  Result Date: 09/12/2016 CLINICAL DATA:  Fall with right arm injury.  Initial encounter. EXAM: RIGHT HUMERUS - 2+ VIEW COMPARISON:  None. FINDINGS: There is a fracture of the right humeral neck with medial displacement of the humeral shaft relative to the humeral head. There also likely is a fracture of the humeral head through the level of the  greater tuberosity and the humeral head appears abnormally rotated. IMPRESSION: Displaced fracture of the surgical neck of the proximal right humerus. Electronically Signed   By: Aletta Edouard M.D.   On: 09/12/2016 16:52   Dg Knee 2 Views Right  Result Date: 09/12/2016 CLINICAL DATA:  82 year old female with a history of dementia and fall. EXAM: RIGHT KNEE - 3 VIEW COMPARISON:  None. FINDINGS: No acute displaced fracture. No focal soft tissue swelling.  No joint effusion. Osteopenia. IMPRESSION: No acute bony abnormality. Signed, Dulcy Fanny. Earleen Newport, DO [facility] Radiology Electronically Signed   By: Corrie Mckusick D.O.   On: 09/12/2016 16:53    Assessment:   NYAJA DUBUQUE is a 82 y.o. female with dementia, recent shoulder replacement following fall admit with dehydration, and UTI. Cx  with E coli, resistant to the cipro she has been on. Tolerated meropenem given yesterday.  Since she has tolerated meropenem will suggest trying cephalosporins  Recommendations Give ctx x 1 dose and if tolerates can dc on keflex 500 bid for 7 days total abx  Thank you very much for allowing me to participate in the care of this patient. Please call with questions.   Cheral Marker. Ola Spurr, MD

## 2016-10-07 NOTE — Progress Notes (Signed)
Foley removed per order.

## 2016-10-07 NOTE — Progress Notes (Signed)
Dr. Bridgett Larsson notified no reaction noted after 1 dose of Ceftriaxone. MD modified discharge summary for oral cephalosporin. Also aware Alecia Palliative NP meet with family and patient is now a DNR. DNR placed in packed for discharge back to [facility] today.   Randall Hiss CSW notified modified discharge summary now complete and patient is ready for discharge.

## 2016-10-07 NOTE — Discharge Summary (Addendum)
[place] [facility] [place] NAME: Deborah Espinoza    MR#:  [id number]  DATE OF BIRTH:  [birth year]  DATE OF ADMISSION:  10/03/2016   ADMITTING PHYSICIAN: Harvie Bridge, DO  DATE OF DISCHARGE: 10/07/2016 PRIMARY CARE PHYSICIAN: Loura Pardon, MD   ADMISSION DIAGNOSIS:  Hypernatremia [E87.0] Acute cystitis without hematuria [N30.00] AKI (acute kidney injury) ([place]) [N17.9] DISCHARGE DIAGNOSIS:  Active Problems:   Hypernatremia   Palliative care encounter   Adult failure to thrive ARF, UTI, left arm DVT. SECONDARY DIAGNOSIS:   Past Medical History:  Diagnosis Date  . Allergic rhinitis, cause unspecified   . Breast cancer ([place])    right  . Cataracts, bilateral    complications from eye surgery  . Dizziness and giddiness   . Edema   . Elevated blood pressure reading without diagnosis of hypertension   . External hemorrhoids without mention of complication   . GERD (gastroesophageal reflux disease)   . Lichen, unspecified    sclerosis  . Memory loss   . Mixed hyperlipidemia   . Osteoporosis, unspecified   . Other constipation   . Personal history of colonic polyps   . Sprain of neck   . Unspecified constipation   . Unspecified essential hypertension   . Unspecified hearing loss   . Urticaria, unspecified   . Varicose veins of lower extremities with other complications    HOSPITAL COURSE:  82 year old female with a history of moderate dementia and recent right shoulder replacement who presents with abnormal findings on laboratory.  1. Severe hypernatremia in the setting of dehydration and dementia: Improved with D5, appreciated Dr. Holley Raring, nephrology consultation.  2. Acute kidney injury: Improved with IV fluid support.  * Hyperkalemia due to K supplement, discontinued KCl. F/u BMP. * For causing anemia. Improved with IV fluid support.  3. Hypokalemia: Potassium is provided with IV fluids, improved but  hyperkalemia.  4. UTI: discontinued ciprofloxacin  since urine culture result shows E Coli resistant to cipro and sensitive to rocephin. But allergy to rocephin. Started meropenem.  Per Dr. Blane Ohara recommendation, the patient got 1 dose of IV Rocephin without reaction just now, change to Keflex 7 days.  5. Dementia: Continue Aricept and dysphagia 2 diet per speech evaluation.  6. Hypertension: Continue Norvasc  7. Recent right shoulder arthroplasty:  reasonable to initiate some physical occupational therapy while she is here per Dr. Roland Rack. orthopedic surgery.  Right arm DVT per Venous US. Started Eliquis.  DISCHARGE CONDITIONS:  Stable, discharge to skilled facility today. CONSULTS OBTAINED:  Treatment Team:  Anthonette Legato, MD Hessie Knows, MD Leonel Ramsay, MD DRUG ALLERGIES:   Allergies  Allergen Reactions  . Cephalexin   . Clarithromycin   . Codeine   . Hydrocodone   . Penicillins   . Risedronate Sodium    DISCHARGE MEDICATIONS:     Medication List    TAKE these medications   acetaminophen 325 MG tablet Commonly known as:  TYLENOL Take 2 tablets (650 mg total) by mouth every 6 (six) hours as needed for mild pain (or Fever >/= 101).   amLODipine 5 MG tablet Commonly known as:  NORVASC Take 1 tablet (5 mg total) by mouth daily.   apixaban 5 MG Tabs tablet Commonly known as:  ELIQUIS Take 2 tablets (10 mg total) by mouth 2 (two) times daily. For 5 days, then 5 mg po bid.   apixaban 5 MG Tabs tablet Commonly known as:  ELIQUIS Take 1 tablet (  5 mg total) by mouth 2 (two) times daily. Start taking on:  10/12/2016   bisacodyl 5 MG EC tablet Commonly known as:  DULCOLAX Take 1 tablet (5 mg total) by mouth daily as needed for moderate constipation.   cephALEXin 500 MG capsule Commonly known as:  KEFLEX Take 1 capsule (500 mg total) by mouth 3 (three) times daily. No reaction to rocephin iv today. Dr. Ola Spurr suggests keflex for 7 days.    donepezil 10 MG tablet Commonly known as:  ARICEPT Take 10 mg by mouth at bedtime.   feeding supplement (ENSURE ENLIVE) Liqd Take 237 mLs by mouth 3 (three) times daily between meals.   oxyCODONE 5 MG immediate release tablet Commonly known as:  Oxy IR/ROXICODONE Take 1-2 tablets (5-10 mg total) by mouth every 4 (four) hours as needed for breakthrough pain.   PRESERVISION AREDS PO Take 1 tablet by mouth daily.   traMADol 50 MG tablet Commonly known as:  ULTRAM Take 1 tablet (50 mg total) by mouth every 6 (six) hours as needed for moderate pain.   VITAMIN D PO Take 1 tablet by mouth daily.        DISCHARGE INSTRUCTIONS:  See AVS.   If you experience worsening of your admission symptoms, develop shortness of breath, life threatening emergency, suicidal or homicidal thoughts you must seek medical attention immediately by calling 911 or calling your MD immediately  if symptoms less severe.  You Must read complete instructions/literature along with all the possible adverse reactions/side effects for all the Medicines you take and that have been prescribed to you. Take any new Medicines after you have completely understood and accpet all the possible adverse reactions/side effects.   Please note  You were cared for by a hospitalist during your hospital stay. If you have any questions about your discharge medications or the care you received while you were in the hospital after you are discharged, you can call the unit and asked to speak with the hospitalist on call if the hospitalist that took care of you is not available. Once you are discharged, your primary care physician will handle any further medical issues. Please note that NO REFILLS for any discharge medications will be authorized once you are discharged, as it is imperative that you return to your primary care physician (or establish a relationship with a primary care physician if you do not have one) for your aftercare needs  so that they can reassess your need for medications and monitor your lab values.    On the day of Discharge:  VITAL SIGNS:  Blood pressure (!) 120/52, pulse 96, temperature 97.7 F (36.5 C), temperature source Oral, resp. rate 18, height 5' (1.524 m), weight 94 lb (42.6 kg), SpO2 98 %. PHYSICAL EXAMINATION:  GENERAL:  82 y.o.-year-old patient lying in the bed with no acute distress.  EYES: Pupils equal, round, reactive to light and accommodation. No scleral icterus. Extraocular muscles intact.  HEENT: Head atraumatic, normocephalic.  NECK:  Supple, no jugular venous distention. No thyroid enlargement, no tenderness.  LUNGS: Normal breath sounds bilaterally, no wheezing, rales,rhonchi or crepitation. No use of accessory muscles of respiration.  CARDIOVASCULAR: S1, S2 normal. No murmurs, rubs, or gallops.  ABDOMEN: Soft, non-tender, non-distended. Bowel sounds present. No organomegaly or mass.  EXTREMITIES: No pedal edema, cyanosis, or clubbing. Right shoulder in sling. NEUROLOGIC: The patient is demented and unable to exam. PSYCHIATRIC: The patient is awake but demented. SKIN: No obvious rash, lesion, or ulcer.  DATA REVIEW:  CBC  Recent Labs Lab 10/04/16 0354  WBC 10.4  HGB 10.3*  HCT 31.1*  PLT 311    Chemistries   Recent Labs Lab 10/03/16 1924 10/04/16 0354  10/07/16 0424  NA 160* 157*  < > 140  K 3.3* 3.1*  < > 3.9  CL 126* 126*  < > 110  CO2 28 29  < > 25  GLUCOSE 103* 127*  < > 100*  BUN 38* 33*  < > 17  CREATININE 1.54* 1.25*  < > 1.15*  CALCIUM 11.5* 10.9*  < > 10.5*  MG  --  2.4  --   --   AST 26  --   --   --   ALT 18  --   --   --   ALKPHOS 102  --   --   --   BILITOT 0.5  --   --   --   < > = values in this interval not displayed.   Microbiology Results  Results for orders placed or performed during the hospital encounter of 10/03/16  Urine culture     Status: Abnormal   Collection Time: 10/03/16  7:32 PM  Result Value Ref Range Status    Specimen Description URINE, RANDOM  Final   Special Requests NONE  Final   Culture >=100,000 COLONIES/mL ESCHERICHIA COLI (A)  Final   Report Status 10/06/2016 FINAL  Final   Organism ID, Bacteria ESCHERICHIA COLI (A)  Final      Susceptibility   Escherichia coli - MIC*    AMPICILLIN 8 SENSITIVE Sensitive     CEFAZOLIN <=4 SENSITIVE Sensitive     CEFTRIAXONE <=1 SENSITIVE Sensitive     CIPROFLOXACIN >=4 RESISTANT Resistant     GENTAMICIN <=1 SENSITIVE Sensitive     IMIPENEM <=0.25 SENSITIVE Sensitive     NITROFURANTOIN <=16 SENSITIVE Sensitive     TRIMETH/SULFA >=320 RESISTANT Resistant     AMPICILLIN/SULBACTAM <=2 SENSITIVE Sensitive     PIP/TAZO <=4 SENSITIVE Sensitive     Extended ESBL NEGATIVE Sensitive     * >=100,000 COLONIES/mL ESCHERICHIA COLI  MRSA PCR Screening     Status: None   Collection Time: 10/04/16 12:55 AM  Result Value Ref Range Status   MRSA by PCR NEGATIVE NEGATIVE Final    Comment:        The GeneXpert MRSA Assay (FDA approved for NASAL specimens only), is one component of a comprehensive MRSA colonization surveillance program. It is not intended to diagnose MRSA infection nor to guide or monitor treatment for MRSA infections.     RADIOLOGY:  No results found.   Management plans discussed with the patient, family and they are in agreement.  CODE STATUS: DO NOT RESUSCITATE. For palliative care team, Elmo Putt, the patient CODE STATUS is just changed to DO NOT RESUSCITATE.    Code Status Orders        Start     Ordered   10/04/16 0242  Full code  Continuous     10/04/16 0241    Code Status History    Date Active Date Inactive Code Status Order ID Comments User Context   09/19/2016  6:02 PM 09/22/2016 10:00 PM Full Code [id number]  Corky Mull, MD Inpatient   09/12/2016  8:41 PM 09/16/2016  1:20 AM Full Code [id number]  Nicholes Mango, MD ED      TOTAL TIME TAKING CARE OF THIS PATIENT: 38 minutes.    Demetrios Loll M.D on 10/07/2016 at 1:28  PM  Between 7am to 6pm - Pager - [phone]  After 6pm go to [email or web address] - Proofreader  [facility]  Office  [phone]  CC: Primary care physician; Loura Pardon, MD   Note: This dictation was prepared with Dragon dictation along with smaller phrase technology. Any transcriptional errors that result from this process are unintentional.

## 2016-10-07 NOTE — Progress Notes (Signed)
[facility]  ROUNDING NOTE   Subjective:  Patient resting comfortably in bed. BU and currently down to 17 and creatinine is 1.15. Serum sodium remains in the normal range at 140. Good urine output noted.   Objective:  Vital signs in last 24 hours:  Temp:  [97.7 F (36.5 C)-99.2 F (37.3 C)] 97.7 F (36.5 C) (12/01 0522) Pulse Rate:  [87-110] 87 (12/01 0522) Resp:  [18-19] 18 (12/01 0522) BP: (113-137)/(49-99) 116/49 (12/01 0522) SpO2:  [98 %-100 %] 99 % (12/01 0522)  Weight change:  Filed Weights   10/03/16 1852  Weight: 42.6 kg (94 lb)    Intake/Output: I/O last 3 completed shifts: In: 3501.8 [P.O.:240; I.V.:3211.8; IV Piggyback:50] Out: 2300 [Urine:2300]   Intake/Output this shift:  No intake/output data recorded.  Physical Exam: General: Cachetic  Head: Normocephalic, atraumatic.Dry oral mucosal membranes  Eyes: Anicteric  Neck: Supple, trachea midline  Lungs:  Clear to auscultation, normal effort  Heart: S1S2 no rubs  Abdomen:  Soft, nontender, bowel sounds present  Extremities: no peripheral edema.  Right shoulder incision  Neurologic: Awake, alert, not folllowing commands consistently  Skin: No lesions       Basic Metabolic Panel:  Recent Labs Lab 10/03/16 1924 10/04/16 0354 10/05/16 0457 10/06/16 0359 10/07/16 0424  NA 160* 157* 148* 136 140  K 3.3* 3.1* 5.4* 4.4 3.9  CL 126* 126* 121* 108 110  CO2 28 29 24 24 25   GLUCOSE 103* 127* 136* 168* 100*  BUN 38* 33* 29* 22* 17  CREATININE 1.54* 1.25* 1.32* 1.37* 1.15*  CALCIUM 11.5* 10.9* 11.4* 10.8* 10.5*  MG  --  2.4  --   --   --   PHOS  --  2.5  --   --   --     Liver Function Tests:  Recent Labs Lab 10/03/16 1924  AST 26  ALT 18  ALKPHOS 102  BILITOT 0.5  PROT 6.5  ALBUMIN 3.1*   No results for input(s): LIPASE, AMYLASE in the last 168 hours. No results for input(s): AMMONIA in the last 168 hours.  CBC:  Recent Labs Lab 10/03/16 1924 10/04/16 0354  WBC 12.7*  10.4  NEUTROABS 10.5*  --   HGB 11.4* 10.3*  HCT 35.4 31.1*  MCV 96.7 95.6  PLT 377 311    Cardiac Enzymes: No results for input(s): CKTOTAL, CKMB, CKMBINDEX, TROPONINI in the last 168 hours.  BNP: Invalid input(s): POCBNP  CBG: No results for input(s): GLUCAP in the last 168 hours.  Microbiology: Results for orders placed or performed during the hospital encounter of 10/03/16  Urine culture     Status: Abnormal   Collection Time: 10/03/16  7:32 PM  Result Value Ref Range Status   Specimen Description URINE, RANDOM  Final   Special Requests NONE  Final   Culture >=100,000 COLONIES/mL ESCHERICHIA COLI (A)  Final   Report Status 10/06/2016 FINAL  Final   Organism ID, Bacteria ESCHERICHIA COLI (A)  Final      Susceptibility   Escherichia coli - MIC*    AMPICILLIN 8 SENSITIVE Sensitive     CEFAZOLIN <=4 SENSITIVE Sensitive     CEFTRIAXONE <=1 SENSITIVE Sensitive     CIPROFLOXACIN >=4 RESISTANT Resistant     GENTAMICIN <=1 SENSITIVE Sensitive     IMIPENEM <=0.25 SENSITIVE Sensitive     NITROFURANTOIN <=16 SENSITIVE Sensitive     TRIMETH/SULFA >=320 RESISTANT Resistant     AMPICILLIN/SULBACTAM <=2 SENSITIVE Sensitive     PIP/TAZO <=4  SENSITIVE Sensitive     Extended ESBL NEGATIVE Sensitive     * >=100,000 COLONIES/mL ESCHERICHIA COLI  MRSA PCR Screening     Status: None   Collection Time: 10/04/16 12:55 AM  Result Value Ref Range Status   MRSA by PCR NEGATIVE NEGATIVE Final    Comment:        The GeneXpert MRSA Assay (FDA approved for NASAL specimens only), is one component of a comprehensive MRSA colonization surveillance program. It is not intended to diagnose MRSA infection nor to guide or monitor treatment for MRSA infections.     Coagulation Studies: No results for input(s): LABPROT, INR in the last 72 hours.  Urinalysis: No results for input(s): COLORURINE, LABSPEC, PHURINE, GLUCOSEU, HGBUR, BILIRUBINUR, KETONESUR, PROTEINUR, UROBILINOGEN, NITRITE,  LEUKOCYTESUR in the last 72 hours.  Invalid input(s): APPERANCEUR    Imaging: US Venous Img Upper Uni Right  Result Date: 10/05/2016 CLINICAL DATA:  82 year old with right arm swelling for a week. History of shoulder arthroplasty. EXAM: RIGHT UPPER EXTREMITY VENOUS DOPPLER ULTRASOUND TECHNIQUE: Gray-scale sonography with graded compression, as well as color Doppler and duplex ultrasound were performed to evaluate the upper extremity deep venous system from the level of the subclavian vein and including the jugular, axillary, basilic, radial, ulnar and upper cephalic vein. Spectral Doppler was utilized to evaluate flow at rest and with distal augmentation maneuvers. COMPARISON:  None. FINDINGS: Contralateral Subclavian Vein: Color Doppler flow and normal phasicity in the left subclavian vein without evidence of thrombus. Internal Jugular Vein: Normal compressibility and color Doppler flow in the right internal jugular vein. Subclavian Vein: Color Doppler flow and phasicity in the right subclavian vein without evidence of thrombus. Axillary Vein: Patient refused imaging of the right axilla. Cephalic Vein: Normal compressibility and color Doppler flow in the right cephalic vein. Basilic Vein: The right basilic vein is non compressible with echogenic thrombus. Brachial Veins: One brachial vein is noncompressible with echogenic thrombus. The other right brachial vein appears to be patent. Radial Veins: Renal veins appear to be compressible without thrombus. Ulnar Veins: Ulnar veins appear to be compressible without thrombus. IMPRESSION: Positive for deep venous thrombosis and superficial venous thrombosis in the right upper extremity. There is thrombus within a right brachial vein and there is thrombus within the right basilic vein. Study has technical limitations and the right axillary region was not imaged. Electronically Signed   By: Markus Daft M.D.   On: 10/05/2016 17:28     Medications:   . sodium  chloride 50 mL/hr at 10/07/16 0302   . amLODipine  5 mg Oral Daily  . apixaban  10 mg Oral BID   Followed by  . [START ON 10/12/2016] apixaban  5 mg Oral BID  . cholecalciferol  1,000 Units Oral Daily  . docusate  100 mg Oral BID  . donepezil  10 mg Oral QHS  . feeding supplement (ENSURE ENLIVE)  237 mL Oral TID BM  . meropenem (MERREM) IV  500 mg Intravenous Q12H  . sodium chloride flush  3 mL Intravenous Q12H   acetaminophen **OR** acetaminophen, bisacodyl, magnesium citrate, ondansetron **OR** ondansetron (ZOFRAN) IV, oxyCODONE, senna-docusate, traMADol, zolpidem  Assessment/ Plan:  82 y.o. female  with a PMHx of allergic rhinitis, right breast cancer, GERD, hyperlipidemia, osteoporosis, constipation varicose veins, dementia, who was admitted to [facility] on 10/03/2016 for evaluation of hypernatremia.    1.  Acute renal failure due to severe dehydration. 2.  Severe hypernatremia. 3.  Hypokalemia. 4.  Hypercalcemia.   5.  Possible bladder outlet obstruction. 6.  UTI.  Plan:  Overall patient has improved. Serum sodium has improved to 140. She also appears to have an Escherichia coli UTI. Patient appears to have known allergy to cephalexin. She is currently maintained on meropenem.  Small M spike noted in the serum at 0.1g/dl.  Likely represents MGUS.  Could consider outpt hematology evaluation.   Plan is for patient to return to SNF upon discharge.   LOS: 4 Farouk Vivero 12/1/20178:34 AM

## 2016-10-07 NOTE — Progress Notes (Signed)
ANTICOAGULATION CONSULT NOTE - follow up [place] for apixaban Indication: DVT  Allergies  Allergen Reactions  . Cephalexin   . Clarithromycin   . Codeine   . Hydrocodone   . Penicillins   . Risedronate Sodium     Patient Measurements: Height: 5' (152.4 cm) Weight: 94 lb (42.6 kg) IBW/kg (Calculated) : 45.5   Vital Signs: Temp: 97.7 F (36.5 C) (12/01 0522) Temp Source: Oral (12/01 0522) BP: 133/70 (12/01 0845) Pulse Rate: 99 (12/01 0845)  Labs:  Recent Labs  10/05/16 0457 10/06/16 0359 10/07/16 0424  CREATININE 1.32* 1.37* 1.15*    Estimated Creatinine Clearance: 25.8 mL/min (by C-G formula based on SCr of 1.15 mg/dL (H)).   Medical History: Past Medical History:  Diagnosis Date  . Allergic rhinitis, cause unspecified   . Breast cancer ([place])    right  . Cataracts, bilateral    complications from eye surgery  . Dizziness and giddiness   . Edema   . Elevated blood pressure reading without diagnosis of hypertension   . External hemorrhoids without mention of complication   . GERD (gastroesophageal reflux disease)   . Lichen, unspecified    sclerosis  . Memory loss   . Mixed hyperlipidemia   . Osteoporosis, unspecified   . Other constipation   . Personal history of colonic polyps   . Sprain of neck   . Unspecified constipation   . Unspecified essential hypertension   . Unspecified hearing loss   . Urticaria, unspecified   . Varicose veins of lower extremities with other complications      Assessment: 81yo F who was found to have a right upper extremity DVT.    Goal of Therapy:  Monitor platelets by anticoagulation protocol: Yes   Plan:  Will continue treatment dose apixaban 10mg  PO BID x7days followed by apixaban 5mg  PO BID.  CBC in am.   Noralee Space, PharmD 10/07/2016,12:02 PM

## 2016-10-07 NOTE — Progress Notes (Signed)
Nutrition Follow-up  DOCUMENTATION CODES:   Severe malnutrition in context of chronic illness  INTERVENTION:  Recommend discontinuing Ensure Enlive as patient now on Nectar Thick liquids. Attempted to discontinue, but as discharge summary in, has already been put on discharge med list.  Recommend Chocolate Mightyshake II po once daily, each supplement provides 500 kcal and 23 grams protein. Will not order as discharge summary in now.  NUTRITION DIAGNOSIS:   Malnutrition related to chronic illness as evidenced by severe depletion of muscle mass, severe depletion of body fat.  Ongoing.  GOAL:   Patient will meet greater than or equal to 90% of their needs  Not met.  MONITOR:   PO intake, Labs, I & O's, Weight trends, Supplement acceptance  REASON FOR ASSESSMENT:   Malnutrition Screening Tool    ASSESSMENT:   Deborah Espinoza is a 82 y.o. female with a known history of Hyperlipidemia, breast cancer, GERD, recent right shoulder replacement presents to the emergency department for evaluation of abnormal labs.  -Patient remains on Dysphagia 2 diet, but made Nectar-Thick liquids per SLP evaluation. -Per chart, husband did not come to meeting to discuss goals of care so patient remains full code.   Patient resting in bed at time of assessment. Spoke with RN. She is requiring assistance with meals but not eating much. No decision has been made on goals of care.   Meal Completion: 20-50% per chart and RN report. In the past 24 hours patient has had approximately 703 kcal (65% minimum estimated kcal needs) and 35 grams protein (67% minimum estimated protein needs).   Medications and labs reviewed.  Diet Order:  DIET DYS 2 Room service appropriate? Yes with Assist; Fluid consistency: Nectar Thick Diet - low sodium heart healthy  Skin:  Wound (see comment) (Stg I To coccyx)  Last BM:  10/07/2016  Height:   Ht Readings from Last 1 Encounters:  10/03/16 5' (1.524 m)    Weight:    Wt Readings from Last 1 Encounters:  10/03/16 94 lb (42.6 kg)    Ideal Body Weight:  45.45 kg  BMI:  Body mass index is 18.36 kg/m.  Estimated Nutritional Needs:   Kcal:  1080-1300 calories  Protein:  52-64 gm  Fluid:  >/= 1.1L  EDUCATION NEEDS:   No education needs identified at this time  Willey Blade, MS, RD, LDN Pager: [phone] After Hours Pager: [phone]

## 2016-10-07 NOTE — Care Management Important Message (Signed)
Important Message  Patient Details  Name: JAYDALYNN LALLEY MRN: [id number] Date of Birth: 1933-02-17   Medicare Important Message Given:  Yes    Shelbie Ammons, RN 10/07/2016, 7:35 AM

## 2016-10-07 NOTE — Progress Notes (Signed)
Report called to [facility] at [facility] [phone]). Patient will be going to [place] at [facility]. EMS called for transportation. Patient's husband aware of patient's discharge today.

## 2016-10-07 NOTE — Progress Notes (Signed)
Daily Progress Note   Patient Name: Deborah Espinoza       Date: 10/07/2016 DOB: [birth year]  Age: 82 y.o. MRN#: [id number] Attending Physician: Demetrios Loll, MD Primary Care Physician: Loura Pardon, MD Admit Date: 10/03/2016  Reason for Consultation/Follow-up: Establishing goals of care  Subjective: I met today with Deborah Espinoza's husband and son at bedside. I spent time updated them on her decline and grave concern over her dysphagia and bradycardic episodes with choking. She coughed during my visit and I noted her difficulty to clear secretions. We discussed natural trajectory of dementia and her decline especially since shoulder replacement surgery post fall. They have seen her decline.   Son is very clear that he does not want his mother to suffer. He is supportive of DNR. With the help of the son, husband agrees for DNR. I was frank that I felt that she does not have much time and would be appropriate for hospice care. They will continue to monitor and will consider hospice for her as well. However, she will d/c to SNF today and will need palliative follow up. Husband struggles with decisions and it is more helpful to involve children in the conversation (son lives closest). Emotional support provided.   Length of Stay: 4  Current Medications: Scheduled Meds:  . amLODipine  5 mg Oral Daily  . apixaban  10 mg Oral BID   Followed by  . [START ON 10/12/2016] apixaban  5 mg Oral BID  . cholecalciferol  1,000 Units Oral Daily  . docusate  100 mg Oral BID  . donepezil  10 mg Oral QHS  . feeding supplement (ENSURE ENLIVE)  237 mL Oral TID BM  . sodium chloride flush  3 mL Intravenous Q12H    Continuous Infusions:   PRN Meds: acetaminophen **OR** acetaminophen, bisacodyl, magnesium citrate,  ondansetron **OR** ondansetron (ZOFRAN) IV, oxyCODONE, senna-docusate, traMADol, zolpidem  Physical Exam  Constitutional: She is easily aroused.  Opens eyes but no speech and does not follow commands  Cardiovascular: Regular rhythm and normal heart sounds.   Pulmonary/Chest: Effort normal. She has decreased breath sounds.  Abdominal: Soft. Bowel sounds are decreased.  Neurological: She is easily aroused. She is disoriented.  Skin: Skin is warm and dry. There is pallor.  Psychiatric: She is withdrawn. Cognition and memory are impaired. She  is inattentive.  Nursing note and vitals reviewed.          Vital Signs: BP (!) 120/52 (BP Location: Left Arm)   Pulse 96   Temp 97.7 F (36.5 C) (Oral)   Resp 18   Ht 5' (1.524 m)   Wt 42.6 kg (94 lb)   SpO2 98%   BMI 18.36 kg/m  SpO2: SpO2: 98 % O2 Device: O2 Device: Not Delivered O2 Flow Rate:    Intake/output summary:   Intake/Output Summary (Last 24 hours) at 10/07/16 1531 Last data filed at 10/07/16 1145  Gross per 24 hour  Intake          1093.75 ml  Output             1400 ml  Net          -306.25 ml   LBM: Last BM Date: 10/07/16 Baseline Weight: Weight: 42.6 kg (94 lb) Most recent weight: Weight: 42.6 kg (94 lb)       Palliative Assessment/Data: PPS 10%    Patient Active Problem List   Diagnosis Date Noted  . Palliative care encounter   . Adult failure to thrive   . Hypernatremia 10/03/2016  . Status post reverse total shoulder replacement, right 09/19/2016  . Shoulder fracture 09/13/2016  . Pressure injury of skin 09/13/2016  . Shoulder fracture, right, closed, initial encounter 09/12/2016  . Hyperparathyroidism, primary ([place]) 05/17/2015  . Abnormal TSH 05/08/2014  . Leukocytosis 05/08/2014  . Hypokalemia 11/21/2012  . hx: breast cancer, right, DCIS, receptor + 11/28/2011  . Neuropathy ([place]) 09/27/2011  . NECK SPRAIN AND STRAIN 11/06/2010  . VARICOSE VEINS LOWER EXTREMITIES W/OTH COMPS 05/05/2010  .  HEMORRHOIDS, EXTERNAL 01/06/2010  . Dementia without behavioral disturbance 07/23/2009  . PERIPHERAL EDEMA 04/03/2009  . Essential hypertension 01/22/2009  . DECREASED HEARING 09/02/2008  . CONSTIPATION, CHRONIC 04/21/2008  . HYPERLIPIDEMIA 08/30/2007  . COLONIC POLYPS, HX OF 08/30/2007  . ALLERGIC RHINITIS 08/06/2007  . LICHEN NOS 27/05/8674  . Osteoporosis 08/06/2007    Palliative Care Assessment & Plan   Patient Profile: 82 y.o. female  with past medical history of dementia, hyperlipidemia, breast cancer, GERD, colon polyps, and falls admitted on 10/03/2016 with abnormal labs-sodium of 167. She had a recent fall with hospitalization s/p reverse right total shoulder arthroplasty on 09/19/16. She then went to [facility] for rehab. Diagnosed with acute kidney injury and severe hypernatremia in the setting of dehydration and dementia. Also positive for UTI. She is receiving D5@75  and IV cipro. Nephrology following. Speech therapy has evaluated and recommending dysphagia 2, nectar thick liquids. Physical therapy consulted but patient not participating in therapy. Palliative medicine consultation for goals of care.    Assessment: Dementia Dehydration Hypernatremia Hypokalemia Acute kidney injury UTI Recent right shoulder arthroplasty DVT Failure to thrive  Recommendations/Plan:  I feel patient is eligible for residential hospice. Despite aggressive medical interventions, she has shown minimal improvement in cognition, functional status, and nutritional status and with overall failure to thrive. Shared with family today.   Goals of Care and Additional Recommendations:  Limitations on Scope of Treatment: Full Scope Treatment but did complete DNR  Code Status:    DNR  Prognosis:   Likely days to weeks: guarded-patient with dementia, severe functional and nutritional status decline, two aspiration episodes with bradycardia down to 30's, and overall failure to thrive.     Discharge Planning:  SNF with palliative  Care plan was discussed with RN and Dr. Bridgett Larsson  Thank you  for allowing the Palliative Medicine Team to assist in the care of this patient.   Time In: 1430 Time Out: 1530 Total Time 7mn Prolonged Time Billed  no       Greater than 50%  of this time was spent counseling and coordinating care related to the above assessment and plan.  MIhor Dow FNP-C Palliative Medicine Team  Phone: [phone]: [phone] Please contact Palliative Medicine Team phone at [phone] questions and concerns.

## 2016-10-07 NOTE — Clinical Social Work Note (Signed)
MSW spoke to patient's husband who said he had some concerns about [facility].  MSW listened to patient's husband's concerns and informed him that he should talk with administration at [facility] to resolve the issues.  MSW also informed patient's husband that the SNF can help him find a different facility if he wants to consider transferring patient.  Patient's husband did agree to return back to [facility]  Patient to be d/c'ed today to [facility].  Patient and family agreeable to plans will transport via ems RN to call report.  Evette Cristal, MSW Mon-Fri 8a-4:30p [phone]

## 2016-10-10 ENCOUNTER — Other Ambulatory Visit: Payer: Self-pay | Admitting: *Deleted

## 2016-10-10 ENCOUNTER — Non-Acute Institutional Stay (SKILLED_NURSING_FACILITY): Payer: Medicare Other | Admitting: Internal Medicine

## 2016-10-10 ENCOUNTER — Encounter: Payer: Self-pay | Admitting: Internal Medicine

## 2016-10-10 DIAGNOSIS — N390 Urinary tract infection, site not specified: Secondary | ICD-10-CM

## 2016-10-10 DIAGNOSIS — I82621 Acute embolism and thrombosis of deep veins of right upper extremity: Secondary | ICD-10-CM | POA: Diagnosis not present

## 2016-10-10 DIAGNOSIS — I10 Essential (primary) hypertension: Secondary | ICD-10-CM | POA: Diagnosis not present

## 2016-10-10 DIAGNOSIS — S4291XS Fracture of right shoulder girdle, part unspecified, sequela: Secondary | ICD-10-CM

## 2016-10-10 DIAGNOSIS — R638 Other symptoms and signs concerning food and fluid intake: Secondary | ICD-10-CM | POA: Insufficient documentation

## 2016-10-10 DIAGNOSIS — E43 Unspecified severe protein-calorie malnutrition: Secondary | ICD-10-CM

## 2016-10-10 DIAGNOSIS — D649 Anemia, unspecified: Secondary | ICD-10-CM

## 2016-10-10 DIAGNOSIS — N179 Acute kidney failure, unspecified: Secondary | ICD-10-CM

## 2016-10-10 DIAGNOSIS — E870 Hyperosmolality and hypernatremia: Secondary | ICD-10-CM | POA: Diagnosis not present

## 2016-10-10 DIAGNOSIS — R531 Weakness: Secondary | ICD-10-CM | POA: Diagnosis not present

## 2016-10-10 DIAGNOSIS — K5909 Other constipation: Secondary | ICD-10-CM | POA: Diagnosis not present

## 2016-10-10 DIAGNOSIS — B9620 Unspecified Escherichia coli [E. coli] as the cause of diseases classified elsewhere: Secondary | ICD-10-CM

## 2016-10-10 DIAGNOSIS — F0390 Unspecified dementia without behavioral disturbance: Secondary | ICD-10-CM

## 2016-10-10 MED ORDER — OXYCODONE HCL 5 MG PO TABS: 5.0000 mg | ORAL_TABLET | ORAL | 0 refills | Status: AC | PRN

## 2016-10-10 NOTE — Telephone Encounter (Signed)
[facility] [phone] Fax: [phone]  

## 2016-10-10 NOTE — Progress Notes (Signed)
LOCATION[facility]  PCP: Loura Pardon, MD   Code Status: Full Code  Goals of care: Advanced Directive information Advanced Directives 10/03/2016  Does Patient Have a Medical Advance Directive? No  Would patient like information on creating a medical advance directive? -       Extended Emergency Contact Information Primary Emergency Contact: Mikes,Coy Address: [address]          [place], [place] [address] [place] Phone: [phone] Relation: Spouse Secondary Emergency Contact: Quillian Quince [place] Phone: [phone] Mobile Phone: [phone] Relation: Son   Allergies  Allergen Reactions  . Cephalexin   . Clarithromycin   . Codeine   . Hydrocodone   . Penicillins   . Risedronate Sodium     Chief Complaint  Patient presents with  . Readmit To SNF    Readmission Visit     HPI:  Patient is a 82 y.o. female seen today for short term rehabilitation post hospital re-admission from 10/03/16-10/07/16 with hypernatremia and acute kidney injury from dehydration. She received D5W and made improvement. She was started on antibiotic for e.coli UTI. She was diagnosed with acute right arm DVT and is now on eliquis. She was at the facility undergoing rehabilitation for right shoulder arthroplasty. She is seen in her room today with her husband and brother at bedside. She has advanced dementia and does not participate in HPI and ROS.   Review of Systems: Unable to obtain from patient. Per nursing staff, has been refusing to eat. She has been in bed. Per husband, she was out of bed daily at home. She would feed herself at home. Per nursing staff, she has been coughing with sips of water.    Past Medical History:  Diagnosis Date  . Allergic rhinitis, cause unspecified   . Breast cancer ([place])    right  . Cataracts, bilateral    complications from eye surgery  . Dizziness and giddiness   . Edema   . Elevated blood  pressure reading without diagnosis of hypertension   . External hemorrhoids without mention of complication   . GERD (gastroesophageal reflux disease)   . Lichen, unspecified    sclerosis  . Memory loss   . Mixed hyperlipidemia   . Osteoporosis, unspecified   . Other constipation   . Personal history of colonic polyps   . Sprain of neck   . Unspecified constipation   . Unspecified essential hypertension   . Unspecified hearing loss   . Urticaria, unspecified   . Varicose veins of lower extremities with other complications    Past Surgical History:  Procedure Laterality Date  . BILATERAL OOPHORECTOMY     bilateral  . BREAST BIOPSY  12/07   pos ductal carcinoma in situ  . BREAST LUMPECTOMY  2008   RIGHT  . CATARACT EXTRACTION  04/2003  . COLONOSCOPY  11/02   polyp  . COLONOSCOPY  09/2004   polyps  . COLONOSCOPY  11/08   adenomatous polyps  . DEXA  2001   Osteopenia  . DEXA     improved OP  . DEXA  11/2004   OP  . DEXA  2/09   OP T score-3.0 spine and -2.5 in fem neck  . nuclear stress  05/2007   negative  . ovarian tumor  11/04   benign  . PARTIAL HYSTERECTOMY  1987   fibroids  . REVERSE SHOULDER ARTHROPLASTY Right 09/19/2016   Procedure: REVERSE RIGHT TOTAL SHOULDER  ARTHROPLASTY;  Surgeon: Corky Mull, MD;  Location: [facility] ORS;  Service: Orthopedics;  Laterality: Right;  . stress perfusion cardiac study  7/22008   Normal with normal EF   Social History:   reports that she has never smoked. She has never used smokeless tobacco. She reports that she does not drink alcohol or use drugs.  Family History  Problem Relation Age of Onset  . Brain cancer Father   . Diabetes Father   . Kidney cancer Mother     renal cell  . Cancer Mother   . Diabetes Brother   . Diabetes Brother   . Colon cancer Brother   . Alzheimer's disease      Aunt    Medications:   Medication List       Accurate as of 10/10/16 11:50 AM. Always use your most recent med list.            acetaminophen 325 MG tablet Commonly known as:  TYLENOL Take 2 tablets (650 mg total) by mouth every 6 (six) hours as needed for mild pain (or Fever >/= 101).   amLODipine 5 MG tablet Commonly known as:  NORVASC Take 1 tablet (5 mg total) by mouth daily.   apixaban 5 MG Tabs tablet Commonly known as:  ELIQUIS Take 2 tablets (10 mg total) by mouth 2 (two) times daily. For 5 days, then 5 mg po bid.   bisacodyl 5 MG EC tablet Commonly known as:  DULCOLAX Take 1 tablet (5 mg total) by mouth daily as needed for moderate constipation.   cefTRIAXone IVPB Commonly known as:  ROCEPHIN Inject 1 g into the vein daily. Stop date 10/14/16   donepezil 10 MG tablet Commonly known as:  ARICEPT Take 10 mg by mouth at bedtime.   oxyCODONE 5 MG immediate release tablet Commonly known as:  Oxy IR/ROXICODONE Take 1-2 tablets (5-10 mg total) by mouth every 4 (four) hours as needed for breakthrough pain.   PRESERVISION AREDS PO Take 1 tablet by mouth daily.   traMADol 50 MG tablet Commonly known as:  ULTRAM Take 1 tablet (50 mg total) by mouth every 6 (six) hours as needed for moderate pain.   UNABLE TO FIND Med Name: Med pass 120 mL by mouth 3 times daily between meals   VITAMIN D PO Take 1 tablet by mouth daily.       Immunizations: Immunization History  Administered Date(s) Administered  . Influenza Split 09/27/2011, 08/08/2012  . Influenza Whole 08/28/2006, 08/30/2007, 09/02/2008, 08/05/2009, 09/13/2010  . Influenza,inj,Quad PF,36+ Mos 10/15/2013, 09/16/2014, 10/29/2015, 08/08/2016  . PPD Test 09/22/2016  . Pneumococcal Conjugate-13 05/06/2015  . Pneumococcal Polysaccharide-23 08/21/2003  . Td 08/19/2002, 09/13/2010  . Zoster 12/10/2011     Physical Exam: Vitals:   10/10/16 1142  BP: (!) 120/52  Pulse: 96  Resp: 18  Temp: 98.6 F (37 C)  TempSrc: Oral  SpO2: 96%  Weight: 93 lb (42.2 kg)  Height: 5' (1.524 m)   Body mass index is 18.16 kg/m.  General- elderly  female, frail and thin built, in no acute distress Head- normocephalic, atraumatic Nose- no nasal discharge Throat- dry mucus membrane Eyes- PERRLA, EOMI, no pallor, no icterus Neck- no cervical lymphadenopathy Cardiovascular- normal s1,s2, no murmur Respiratory- bilateral clear to auscultation, no wheeze, no rhonchi, no crackles, no use of accessory muscles Abdomen- bowel sounds present, soft, non tender Musculoskeletal- able to move all 4 extremities, right arm in sling with some edema present, no leg edema Neurological- somnolent  Skin- warm and dry, surgical incision to right shoulder with steri strip and healing well, blanchable redness to her sacral area   Labs reviewed: Basic Metabolic Panel:  Recent Labs  09/13/16 0452  09/21/16 0129  10/04/16 0354 10/05/16 0457 10/06/16 0359 10/07/16 0424  NA 144  < > 144  < > 157* 148* 136 140  K 4.2  < > 4.1  < > 3.1* 5.4* 4.4 3.9  CL 112*  < > 112*  < > 126* 121* 108 110  CO2 25  < > 27  < > 29 24 24 25   GLUCOSE 112*  < > 107*  < > 127* 136* 168* 100*  BUN 21*  < > 19  < > 33* 29* 22* 17  CREATININE 0.84  < > 1.12*  < > 1.25* 1.32* 1.37* 1.15*  CALCIUM 10.4*  < > 10.7*  < > 10.9* 11.4* 10.8* 10.5*  MG 2.2  --  2.0  --  2.4  --   --   --   PHOS  --   --   --   --  2.5  --   --   --   < > = values in this interval not displayed. Liver Function Tests:  Recent Labs  09/12/16 1419 09/13/16 0452 09/27/16 10/03/16 1924  AST 50* 41 21 26  ALT 29 24 16 18   ALKPHOS 94 79  --  102  BILITOT 1.1 1.5*  --  0.5  PROT 7.1 6.4*  --  6.5  ALBUMIN 3.8 3.4*  --  3.1*   No results for input(s): LIPASE, AMYLASE in the last 8760 hours. No results for input(s): AMMONIA in the last 8760 hours. CBC:  Recent Labs  09/12/16 1419  09/20/16 0633  09/22/16 0612  10/03/16 10/03/16 1924 10/04/16 0354  WBC 15.8*  < > 17.1*  < > 16.1*  < > 13.6 12.7* 10.4  NEUTROABS 13.0*  --  14.2*  --   --   --   --  10.5*  --   HGB 13.9  < > 12.0  < > 11.0*   < > 11.3* 11.4* 10.3*  HCT 42.4  < > 36.4  < > 33.2*  < > 37 35.4 31.1*  MCV 92.6  < > 95.4  < > 94.1  --   --  96.7 95.6  PLT 295  < > 222  < > 271  < > 377 377 311  < > = values in this interval not displayed. Cardiac Enzymes:  Recent Labs  09/21/16 0129 09/21/16 0735 09/21/16 1519  TROPONINI 0.03* 0.03* <0.03   BNP: Invalid input(s): POCBNP CBG:  Recent Labs  09/21/16 0013  GLUCAP 115*    Radiological Exams: Dg Chest 2 View  Result Date: 09/21/2016 CLINICAL DATA:  Cough EXAM: CHEST  2 VIEW COMPARISON:  09/12/2016 FINDINGS: Decreased lung volume since the prior study. Mild bibasilar atelectasis. Negative for heart failure. Minimal pleural effusion. Right shoulder arthroplasty has been performed since the prior study. IMPRESSION: Hypoventilation with mild bibasilar atelectasis. Electronically Signed   By: Franchot Gallo M.D.   On: 09/21/2016 09:17   Dg Pelvis 1-2 Views  Result Date: 09/12/2016 CLINICAL DATA:  Bilateral hip pain after falls. EXAM: PELVIS - 1-2 VIEW COMPARISON:  None.- FINDINGS: Pelvic bony ring is intact. No gross abnormality to the hips. There is gas in the rectum. No evidence for a fracture. IMPRESSION: No acute bone abnormality. Electronically Signed   By: Quita Skye  Anselm Pancoast M.D.   On: 09/12/2016 16:53   Dg Shoulder Right  Result Date: 09/12/2016 CLINICAL DATA:  Right shoulder pain since a fall out of bed 3 days ago. EXAM: RIGHT SHOULDER - 2+ VIEW COMPARISON:  None. FINDINGS: The patient has an acute surgical neck fracture of the right shoulder with foreshortening of approximately 3.5 cm and 1 shaft with anterior displacement. The fracture appears to involve the greater tuberosity. The humeral head remains located. The acromioclavicular joint is intact. No other acute abnormality is identified. IMPRESSION: Acute surgical neck fracture right humerus as described above. Electronically Signed   By: Inge Rise M.D.   On: 09/12/2016 19:08   Dg Knee 1-2 Views  Left  Result Date: 09/12/2016 CLINICAL DATA:  Fall. EXAM: LEFT KNEE - 1-2 VIEW COMPARISON:  None. FINDINGS: No evidence of fracture, dislocation, or joint effusion. No evidence of arthropathy or other focal bone abnormality. Soft tissues are unremarkable. IMPRESSION: Negative. Electronically Signed   By: Aletta Edouard M.D.   On: 09/12/2016 16:53   Ct Head Wo Contrast  Result Date: 09/12/2016 CLINICAL DATA:  Dementia, multiple falls x4 days, altered mental status EXAM: CT HEAD WITHOUT CONTRAST CT CERVICAL SPINE WITHOUT CONTRAST TECHNIQUE: Multidetector CT imaging of the head and cervical spine was performed following the standard protocol without intravenous contrast. Multiplanar CT image reconstructions of the cervical spine were also generated. COMPARISON:  Cervical spine radiographs dated 11/09/2010 FINDINGS: CT HEAD FINDINGS Brain: No evidence of acute infarction, hemorrhage, hydrocephalus, extra-axial collection or mass lesion/mass effect. Vascular: No hyperdense vessel or unexpected calcification. Skull: Normal. Negative for fracture or focal lesion. Sinuses/Orbits: Visualized paranasal sinuses are essentially clear. Right mastoid air cells are opacified. Other: Global cortical atrophy.  No ventriculomegaly. Subcortical white matter and periventricular small vessel ischemic changes. Mild intracranial atherosclerosis. CT CERVICAL SPINE FINDINGS Alignment: Normal cervical lordosis. Skull base and vertebrae: No acute fracture. No primary bone lesion or focal pathologic process. Soft tissues and spinal canal: No prevertebral fluid or swelling. No visible canal hematoma. Disc levels:  Moderate degenerative changes at C5-6. Spinal canal remains patent. Upper chest: Visualized lung apices are notable for biapical pleural-parenchymal scarring. Other: Visualized thyroid is unremarkable. IMPRESSION: No evidence of acute intracranial abnormality. Atrophy with small vessel ischemic changes. No evidence of traumatic  injury to the cervical spine. Moderate degenerative changes at C5-6. Electronically Signed   By: Julian Hy M.D.   On: 09/12/2016 16:10   Ct Cervical Spine Wo Contrast  Result Date: 09/12/2016 CLINICAL DATA:  Dementia, multiple falls x4 days, altered mental status EXAM: CT HEAD WITHOUT CONTRAST CT CERVICAL SPINE WITHOUT CONTRAST TECHNIQUE: Multidetector CT imaging of the head and cervical spine was performed following the standard protocol without intravenous contrast. Multiplanar CT image reconstructions of the cervical spine were also generated. COMPARISON:  Cervical spine radiographs dated 11/09/2010 FINDINGS: CT HEAD FINDINGS Brain: No evidence of acute infarction, hemorrhage, hydrocephalus, extra-axial collection or mass lesion/mass effect. Vascular: No hyperdense vessel or unexpected calcification. Skull: Normal. Negative for fracture or focal lesion. Sinuses/Orbits: Visualized paranasal sinuses are essentially clear. Right mastoid air cells are opacified. Other: Global cortical atrophy.  No ventriculomegaly. Subcortical white matter and periventricular small vessel ischemic changes. Mild intracranial atherosclerosis. CT CERVICAL SPINE FINDINGS Alignment: Normal cervical lordosis. Skull base and vertebrae: No acute fracture. No primary bone lesion or focal pathologic process. Soft tissues and spinal canal: No prevertebral fluid or swelling. No visible canal hematoma. Disc levels:  Moderate degenerative changes at C5-6. Spinal canal remains patent. Upper  chest: Visualized lung apices are notable for biapical pleural-parenchymal scarring. Other: Visualized thyroid is unremarkable. IMPRESSION: No evidence of acute intracranial abnormality. Atrophy with small vessel ischemic changes. No evidence of traumatic injury to the cervical spine. Moderate degenerative changes at C5-6. Electronically Signed   By: Julian Hy M.D.   On: 09/12/2016 16:10   Ct Shoulder Right Wo Contrast  Result Date:  09/13/2016 CLINICAL DATA:  Status post fall 4 days ago with a right humerus fracture. Preoperative planning examination. EXAM: CT OF THE RIGHT SHOULDER WITHOUT CONTRAST TECHNIQUE: Multidetector CT imaging was performed according to the standard protocol. Multiplanar CT image reconstructions were also generated. COMPARISON:  Plain films right shoulder 09/12/2016. PA and lateral chest 05/08/2014. FINDINGS: As seen on the comparison plain films, the patient has a fracture of the surgical neck of the right humerus. The shaft of the humerus demonstrates anterior displacement of approximately 4.2 cm. The humeral head is rotated with the greater tuberosity directed toward the superior margin of the glenoid. There is fragment override of approximately 2 cm. The fracture extends through the greater tuberosity most notable posteriorly where there is distraction of approximately 0.7 cm and mild comminution. The lesser tuberosity is spared. The humeral head remains located. The acromioclavicular joint is intact and unremarkable. The rotator cuff of and long head of biceps tendon appear intact. The patient has a severe compression fracture deformity of T7. Milder compression fracture of T8 is also identified. These fractures are remote and seen on the comparison plain films of the chest. Aortic atherosclerosis is identified. Imaged lung parenchyma is clear. IMPRESSION: Acute surgical neck fracture of the humerus with anterior displacement of approximately 4.2 cm and fragment override of approximately 2 cm. The fracture extends into the greater tuberosity where there is mild distraction and comminution. As noted above, the humeral head is rotated due to retraction by the rotator cuff with the greater tuberosity directed toward the glenoid. Remote T7 and T8 compression fractures. Calcific aortic atherosclerosis. Electronically Signed   By: Inge Rise M.D.   On: 09/13/2016 13:16   US Renal  Result Date:  10/04/2016 CLINICAL DATA:  Acute renal failure EXAM: RENAL / URINARY TRACT ULTRASOUND COMPLETE COMPARISON:  None. FINDINGS: Right Kidney: Length: 9.5 cm. Echogenicity and renal cortical thickness are within normal limits. No perinephric fluid or hydronephrosis visualized. There is a cyst arising from the upper pole of the right kidney measuring 2.1 x 1.2 x 1.9 cm. There is a cyst arising from the lower pole of the right kidney measuring 1.6 x 1.6 x 1.7 cm. No sonographically demonstrable calculus or ureterectasis. Left Kidney: Length: 8.1 cm. Echogenicity and renal cortical thickness are within normal limits. No mass, perinephric fluid, or hydronephrosis visualized. No sonographically demonstrable calculus or ureterectasis. Bladder: There is debris evident in the urinary bladder. No well-defined mass appreciable by ultrasound in the urinary bladder. Urinary bladder is moderately distended. IMPRESSION: The left kidney is somewhat small. This finding may be a function of age but also may be indicative of medical renal disease. The possibility of renal artery stenosis on the left this also be of concern. In this regard, question whether patient is hypertensive. Renal echogenicity and cortical thickness are within normal limits bilaterally. No obstructing foci identified in either kidney. There are simple cysts arising from the left kidney. Debris in the urinary bladder raises concern for infection/cystitis. Moderate distention of the urinary bladder raises concern for potential degree of bladder outlet obstruction. Electronically Signed   By: Gwyndolyn Saxon  Jasmine December III M.D.   On: 10/04/2016 17:30   Dg Hand 2 View Right  Result Date: 09/12/2016 CLINICAL DATA:  82 year old female with a history of fall. Arm pain. EXAM: RIGHT HAND - 2 VIEW COMPARISON:  None. FINDINGS: Osteopenia. Acute nondisplaced fracture at the base of the distal phalanx of the right thumb. Advanced osteoarthritis of the right hand.  No erosive  changes. No radiopaque foreign body. IMPRESSION: Acute nondisplaced fracture at the base of the distal phalanx of the right thumb. Osteopenia and advanced osteoarthritis. Signed, Dulcy Fanny. Earleen Newport, DO [facility] Electronically Signed   By: Corrie Mckusick D.O.   On: 09/12/2016 16:52   US Venous Img Upper Uni Right  Result Date: 10/05/2016 CLINICAL DATA:  82 year old with right arm swelling for a week. History of shoulder arthroplasty. EXAM: RIGHT UPPER EXTREMITY VENOUS DOPPLER ULTRASOUND TECHNIQUE: Gray-scale sonography with graded compression, as well as color Doppler and duplex ultrasound were performed to evaluate the upper extremity deep venous system from the level of the subclavian vein and including the jugular, axillary, basilic, radial, ulnar and upper cephalic vein. Spectral Doppler was utilized to evaluate flow at rest and with distal augmentation maneuvers. COMPARISON:  None. FINDINGS: Contralateral Subclavian Vein: Color Doppler flow and normal phasicity in the left subclavian vein without evidence of thrombus. Internal Jugular Vein: Normal compressibility and color Doppler flow in the right internal jugular vein. Subclavian Vein: Color Doppler flow and phasicity in the right subclavian vein without evidence of thrombus. Axillary Vein: Patient refused imaging of the right axilla. Cephalic Vein: Normal compressibility and color Doppler flow in the right cephalic vein. Basilic Vein: The right basilic vein is non compressible with echogenic thrombus. Brachial Veins: One brachial vein is noncompressible with echogenic thrombus. The other right brachial vein appears to be patent. Radial Veins: Renal veins appear to be compressible without thrombus. Ulnar Veins: Ulnar veins appear to be compressible without thrombus. IMPRESSION: Positive for deep venous thrombosis and superficial venous thrombosis in the right upper extremity. There is thrombus  within a right brachial vein and there is thrombus within the right basilic vein. Study has technical limitations and the right axillary region was not imaged. Electronically Signed   By: Markus Daft M.D.   On: 10/05/2016 17:28   Dg Chest Port 1 View  Result Date: 09/12/2016 CLINICAL DATA:  Multiple falls.  Humerus fracture. EXAM: PORTABLE CHEST 1 VIEW COMPARISON:  Right humerus 09/22/2016 and chest radiography 05/08/2014 FINDINGS: There is a displaced fracture of the right humeral neck. The distal humeral component is displaced medially. Right AC joint appears to be intact. Both lungs are clear. Negative for pneumothorax. Atherosclerotic calcifications at the aortic arch. Heart size is within normal limits. IMPRESSION: No acute chest abnormality. Displaced fracture of the right humeral neck. Aortic atherosclerosis. Electronically Signed   By: Markus Daft M.D.   On: 09/12/2016 17:04   Dg Shoulder Right Port  Result Date: 09/19/2016 CLINICAL DATA:  Post right shoulder replacement. EXAM: PORTABLE RIGHT SHOULDER COMPARISON:  09/12/2016 FINDINGS: Right total shoulder arthroplasty was performed. Skin staples are noted. Visualized right lung is clear. Right shoulder arthroplasty appears to be located. IMPRESSION: Right shoulder arthroplasty.  No complicating features. Electronically Signed   By: Markus Daft M.D.   On: 09/19/2016 17:17   Dg Humerus Right  Result Date: 09/12/2016 CLINICAL DATA:  Fall with right arm injury.  Initial encounter. EXAM: RIGHT HUMERUS - 2+ VIEW COMPARISON:  None. FINDINGS: There is a fracture  of the right humeral neck with medial displacement of the humeral shaft relative to the humeral head. There also likely is a fracture of the humeral head through the level of the greater tuberosity and the humeral head appears abnormally rotated. IMPRESSION: Displaced fracture of the surgical neck of the proximal right humerus. Electronically Signed   By: Aletta Edouard M.D.   On: 09/12/2016 16:52    Dg Knee 2 Views Right  Result Date: 09/12/2016 CLINICAL DATA:  82 year old female with a history of dementia and fall. EXAM: RIGHT KNEE - 3 VIEW COMPARISON:  None. FINDINGS: No acute displaced fracture. No focal soft tissue swelling.  No joint effusion. Osteopenia. IMPRESSION: No acute bony abnormality. Signed, Dulcy Fanny. Earleen Newport, DO [facility] Electronically Signed   By: Corrie Mckusick D.O.   On: 09/12/2016 16:53     Assessment/Plan  Generalized weakness Will have patient work with PT/OT as tolerated to regain strength and restore function.  Fall precautions are in place. Poor prognosis with her dementia and poor oral intake. Spoke with her husband about goals of care. He would like all treatment possible at present.   E.coli UTI On rocephin 1 gram im daily x 1 week. Hydration to be maintained along with perineal hygiene  Hypernatremia S/p D5 in hospital. Will need to maintain hydration. Monitor bmp  Poor oral intake With failure to thrive. Will provide full assistance with meals. RD and SLP to evaluate  Coughing with meals Concern for dysphagia and risk for aspiration with her dementia. SLP to consult. To have pt sit upright for meals.   Right arm DVT Per venous ultrasound. On eliquis 10 mg bid until 10/11/16 and then 5 mg bid for at least 3 months.   Protein calorie malnutrition Get RD consult, full assistance with feeding  Acute renal failure Required iv fluids in hospital. Monitor renal function  Anemia unspecified Likely form blood loss with surgery, monitor cbc  Right shoulder arthroplasty Will have her work with physical therapy and occupational therapy team to help with gait training and muscle strengthening exercises.fall precautions. Skin care. Encourage to be out of bed. Right arm in sling. Currently on oxicodone 5 mg 1-2 tab q4h prn pain, tramadol 50 mg q6h prn pain and tylenol as needed for pain. Get PMR to  evaluate for pain med adjustment given her somnolence. Discontinue oxyIR for now. Start tylenol 650 mg tid for now until seen by PMR.  Constipation On bisacodyl as needed.   HTN Monitor BP, soft DBP. Currently on norvasc 5 mg daily  Dementia Advanced, provide supportive care. Fall precautions. Preventative dressing to her sacrum. Continue donepezil.     Goals of care: short term rehabilitation   Labs/tests ordered: cbc, cmp 10/12/16  Family/ staff Communication: reviewed care plan with patient's husband and nursing supervisor    Blanchie Serve, MD Internal Medicine [place], [place] [address] Cell Phone (Monday-Friday 8 am - 5 pm): [phone] On Call: [phone] and follow prompts after 5 pm and on weekends Office Phone: [phone] Office Fax: [phone]

## 2016-11-16 ENCOUNTER — Non-Acute Institutional Stay (SKILLED_NURSING_FACILITY): Payer: Medicare Other | Admitting: Family

## 2016-11-16 DIAGNOSIS — M25511 Pain in right shoulder: Secondary | ICD-10-CM | POA: Diagnosis not present

## 2016-11-16 DIAGNOSIS — I10 Essential (primary) hypertension: Secondary | ICD-10-CM

## 2016-11-16 DIAGNOSIS — F0390 Unspecified dementia without behavioral disturbance: Secondary | ICD-10-CM

## 2016-11-16 NOTE — Progress Notes (Signed)
Location:  [facility] Room Number: [place] of Service:  SNF (31) Provider:  Maze Corniel FNP-C   Loura Pardon, MD  Patient Care Team: Abner Greenspan, MD as PCP - General  Extended Emergency Contact Information Primary Emergency Contact: Paules,Coy Address: [address]          [place],  [address] Johnnette Litter of [place] Phone: [phone] Relation: Spouse Secondary Emergency Contact: [place] of [place] Phone: [phone] Mobile Phone: [phone] Relation: Son  Code Status: Full code  Goals of care: Advanced Directive information Advanced Directives 10/03/2016  Does Patient Have a Medical Advance Directive? No  Would patient like information on creating a medical advance directive? -     Chief Complaint  Patient presents with  . Medical Management of Chronic Issues    HPI:  Pt is a 83 y.o. female seen today at [facility]  for medical management of chronic issues. She has a medical history of HTN, Dementia, Neuropathy among other conditions.She is seen in her room today. She awake in bed but unable to provide HPI and ROS information due to dementia. She is post reverse right total shoulder arthroplasty.Her right shoulder pain seems to be under control compared to previous visit. She continues to follow up with facility pain  management specialist.    Past Medical History:  Diagnosis Date  . Allergic rhinitis, cause unspecified   . Breast cancer ([place])    right  . Cataracts, bilateral    complications from eye surgery  . Dizziness and giddiness   . Edema   . Elevated blood pressure reading without diagnosis of hypertension   . External hemorrhoids without mention of complication   . GERD (gastroesophageal reflux disease)   . Lichen, unspecified    sclerosis  . Memory loss   . Mixed hyperlipidemia   . Osteoporosis, unspecified   . Other constipation   . Personal  history of colonic polyps   . Sprain of neck   . Unspecified constipation   . Unspecified essential hypertension   . Unspecified hearing loss   . Urticaria, unspecified   . Varicose veins of lower extremities with other complications    Past Surgical History:  Procedure Laterality Date  . BILATERAL OOPHORECTOMY     bilateral  . BREAST BIOPSY  12/07   pos ductal carcinoma in situ  . BREAST LUMPECTOMY  2008   RIGHT  . CATARACT EXTRACTION  04/2003  . COLONOSCOPY  11/02   polyp  . COLONOSCOPY  09/2004   polyps  . COLONOSCOPY  11/08   adenomatous polyps  . DEXA  2001   Osteopenia  . DEXA     improved OP  . DEXA  11/2004   OP  . DEXA  2/09   OP T score-3.0 spine and -2.5 in fem neck  . nuclear stress  05/2007   negative  . ovarian tumor  11/04   benign  . PARTIAL HYSTERECTOMY  1987   fibroids  . REVERSE SHOULDER ARTHROPLASTY Right 09/19/2016   Procedure: REVERSE RIGHT TOTAL SHOULDER ARTHROPLASTY;  Surgeon: Corky Mull, MD;  Location: [facility] ORS;  Service: Orthopedics;  Laterality: Right;  . stress perfusion cardiac study  7/22008   Normal with normal EF    Allergies  Allergen Reactions  . Cephalexin   . Clarithromycin   . Codeine   . Hydrocodone   . Penicillins   . Risedronate Sodium  Allergies as of 11/16/2016      Reactions   Cephalexin    Clarithromycin    Codeine    Hydrocodone    Penicillins    Risedronate Sodium       Medication List       Accurate as of 11/16/16  6:04 PM. Always use your most recent med list.          acetaminophen 325 MG tablet Commonly known as:  TYLENOL Take 2 tablets (650 mg total) by mouth every 6 (six) hours as needed for mild pain (or Fever >/= 101).   amLODipine 5 MG tablet Commonly known as:  NORVASC Take 1 tablet (5 mg total) by mouth daily.   apixaban 5 MG Tabs tablet Commonly known as:  ELIQUIS Take 2 tablets (10 mg total) by mouth 2 (two) times daily. For 5 days, then 5 mg po bid.   bisacodyl 5 MG EC  tablet Commonly known as:  DULCOLAX Take 1 tablet (5 mg total) by mouth daily as needed for moderate constipation.   donepezil 10 MG tablet Commonly known as:  ARICEPT Take 10 mg by mouth at bedtime.   oxyCODONE 5 MG immediate release tablet Commonly known as:  Oxy IR/ROXICODONE Take 1-2 tablets (5-10 mg total) by mouth every 4 (four) hours as needed for breakthrough pain.   PRESERVISION AREDS PO Take 1 tablet by mouth daily.   traMADol 50 MG tablet Commonly known as:  ULTRAM Take 1 tablet (50 mg total) by mouth every 6 (six) hours as needed for moderate pain.   UNABLE TO FIND Med Name: Med pass 120 mL by mouth 3 times daily between meals   VITAMIN D PO Take 1 tablet by mouth daily.       Review of Systems  Unable to perform ROS: Dementia    Immunization History  Administered Date(s) Administered  . Influenza Split 09/27/2011, 08/08/2012  . Influenza Whole 08/28/2006, 08/30/2007, 09/02/2008, 08/05/2009, 09/13/2010  . Influenza,inj,Quad PF,36+ Mos 10/15/2013, 09/16/2014, 10/29/2015, 08/08/2016  . PPD Test 09/22/2016  . Pneumococcal Conjugate-13 05/06/2015  . Pneumococcal Polysaccharide-23 08/21/2003  . Td 08/19/2002, 09/13/2010  . Zoster 12/10/2011   Pertinent  Health Maintenance Due  Topic Date Due  . INFLUENZA VACCINE  Completed  . DEXA SCAN  Completed  . PNA vac Low Risk Adult  Completed   Fall Risk  05/06/2015  Falls in the past year? No    Vitals:   11/16/16 1115  BP: (!) 120/52  Pulse: 82  Resp: 20  Temp: 98.6 F (37 C)  SpO2: 97%  Weight: 95 lb (43.1 kg)  Height: 5' (1.524 m)   Body mass index is 18.55 kg/m. Physical Exam  Constitutional: She appears well-developed and well-nourished. No distress.  Alert but non-verbal during visit   HENT:  Head: Normocephalic.  Mouth/Throat: Oropharynx is clear and moist. No oropharyngeal exudate.  Eyes: Conjunctivae and EOM are normal. Pupils are equal, round, and reactive to light. Right eye exhibits no  discharge. Left eye exhibits no discharge. No scleral icterus.  Neck: Normal range of motion. No JVD present.  Cardiovascular: Normal rate, regular rhythm, normal heart sounds and intact distal pulses.  Exam reveals no gallop and no friction rub.   No murmur heard. Pulmonary/Chest: Effort normal and breath sounds normal. No respiratory distress. She has no wheezes. She has no rales.  Abdominal: Soft. Bowel sounds are normal. She exhibits no distension. There is no tenderness. There is no rebound and no guarding.  Genitourinary:  Genitourinary Comments: Incontinent for both bowel and bladder  Musculoskeletal: She exhibits no edema, tenderness or deformity.  Moves extremities. Right shoulder limited ROM due to pain.   Lymphadenopathy:    She has no cervical adenopathy.  Neurological: She is alert.  Skin: Skin is warm and dry. No rash noted. No erythema. No pallor.  Psychiatric: She has a normal mood and affect.    Labs reviewed:  Recent Labs  09/13/16 0452  09/21/16 0129  10/04/16 0354 10/05/16 0457 10/06/16 0359 10/07/16 0424  NA 144  < > 144  < > 157* 148* 136 140  K 4.2  < > 4.1  < > 3.1* 5.4* 4.4 3.9  CL 112*  < > 112*  < > 126* 121* 108 110  CO2 25  < > 27  < > 29 24 24 25   GLUCOSE 112*  < > 107*  < > 127* 136* 168* 100*  BUN 21*  < > 19  < > 33* 29* 22* 17  CREATININE 0.84  < > 1.12*  < > 1.25* 1.32* 1.37* 1.15*  CALCIUM 10.4*  < > 10.7*  < > 10.9* 11.4* 10.8* 10.5*  MG 2.2  --  2.0  --  2.4  --   --   --   PHOS  --   --   --   --  2.5  --   --   --   < > = values in this interval not displayed.  Recent Labs  09/12/16 1419 09/13/16 0452 09/27/16 10/03/16 1924  AST 50* 41 21 26  ALT 29 24 16 18   ALKPHOS 94 79  --  102  BILITOT 1.1 1.5*  --  0.5  PROT 7.1 6.4*  --  6.5  ALBUMIN 3.8 3.4*  --  3.1*    Recent Labs  09/12/16 1419  09/20/16 0633  09/22/16 0612  10/03/16 10/03/16 1924 10/04/16 0354  WBC 15.8*  < > 17.1*  < > 16.1*  < > 13.6 12.7* 10.4    NEUTROABS 13.0*  --  14.2*  --   --   --   --  10.5*  --   HGB 13.9  < > 12.0  < > 11.0*  < > 11.3* 11.4* 10.3*  HCT 42.4  < > 36.4  < > 33.2*  < > 37 35.4 31.1*  MCV 92.6  < > 95.4  < > 94.1  --   --  96.7 95.6  PLT 295  < > 222  < > 271  < > 377 377 311  < > = values in this interval not displayed. Lab Results  Component Value Date   TSH 1.50 08/08/2016   Lab Results  Component Value Date   HGBA1C 5.9 12/07/2012   Lab Results  Component Value Date   CHOL 169 08/08/2016   HDL 58.20 08/08/2016   LDLCALC 92 08/08/2016   LDLDIRECT 154.4 08/30/2007   TRIG 96.0 08/08/2016   CHOLHDL 3 08/08/2016   Assessment/Plan 1. HTN B/p stable. Continue on Amlodipine. Will monitor BMP.   2. Right shoulder pain No signs of pain during visit except with movement of right shoulder. Pain seems to be under control. Continue to follow up with PMR. Continue current pain regimen.    3. Dementia without behavioral disturbance  No new behavioral issues reported by staff. Continue to assist with ADL's. Skin care. Fall and safety precautions.   Family/ staff Communication: Reviewed plan of care with facility Nurse  supervisor.   Labs/tests ordered:  None

## 2016-12-08 DEATH — deceased

## 2017-12-02 IMAGING — CT CT SHOULDER*R* W/O CM
2 series · 15 of 20 positions shown, 18 images · non-contrast
Comparison: Plain films right shoulder 09/12/2016. PA and lateral
chest 05/08/2014.

CLINICAL DATA: Status post fall 4 days ago with a right humerus
fracture. Preoperative planning examination.

EXAM:
CT OF THE RIGHT SHOULDER WITHOUT CONTRAST
TECHNIQUE: Multidetector CT imaging was performed according to the standard
protocol. Multiplanar CT image reconstructions were also generated.

[Series 7: ax st · axial · 0.42mm/px · z∈[-196,-67]mm · 12 of 87 slices shown, 15 images]
[im 7/87  soft-tissue]
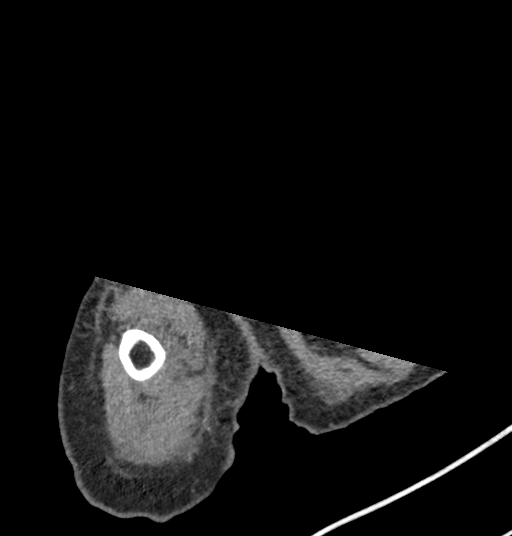
[im 7/87  bone]
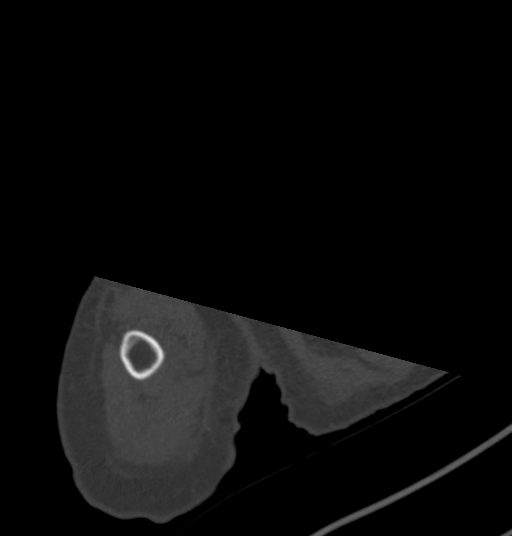
[im 14/87  bone]
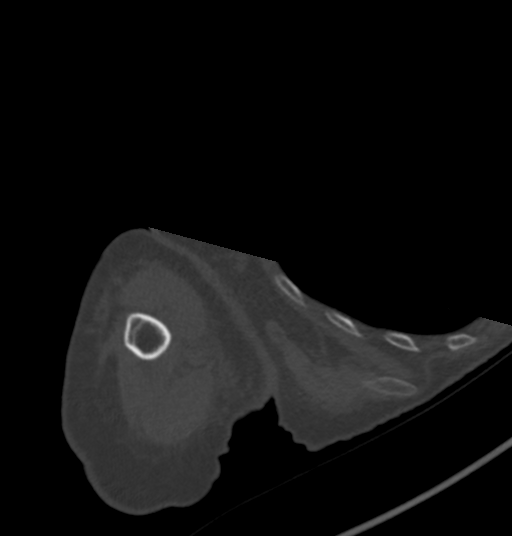
[im 20/87  bone]
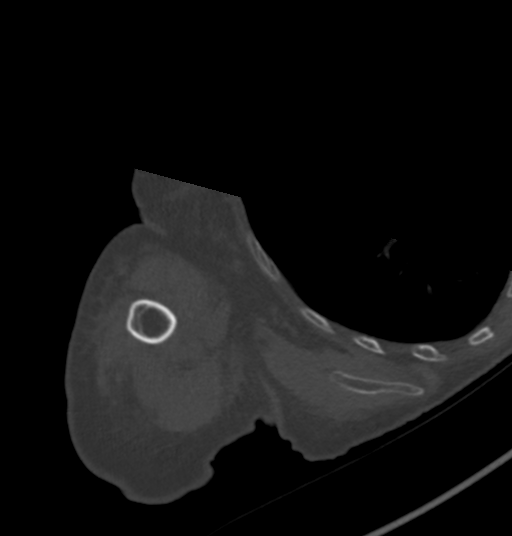
[im 27/87  bone]
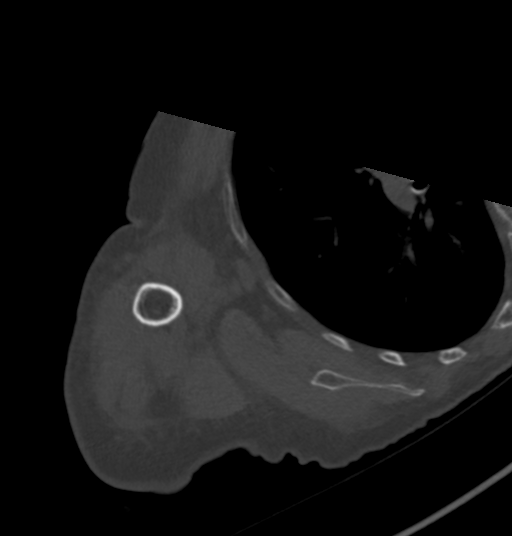
[im 34/87  soft-tissue]
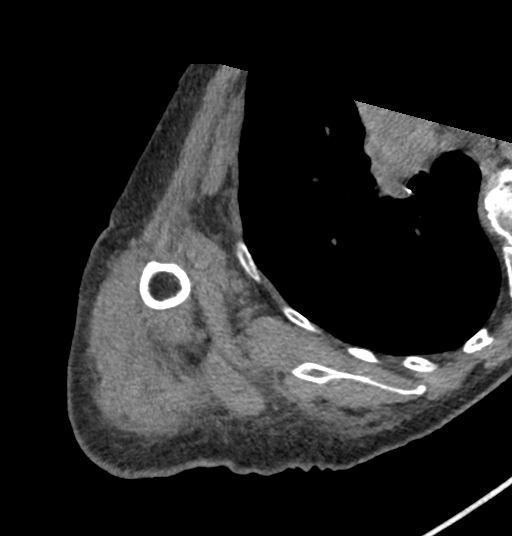
[im 34/87  bone]
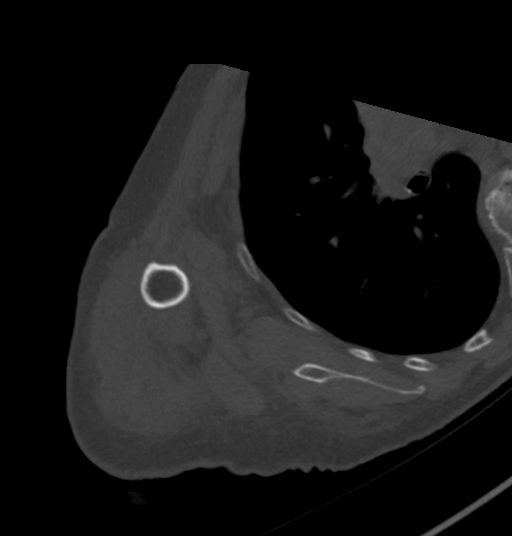
[im 40/87  bone]
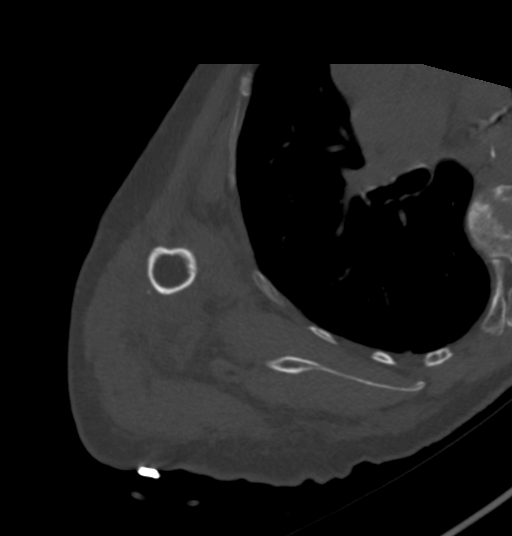
[im 47/87  bone]
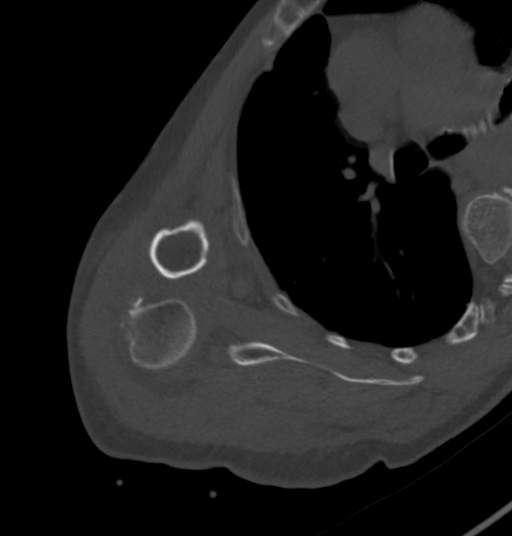
[im 53/87  bone]
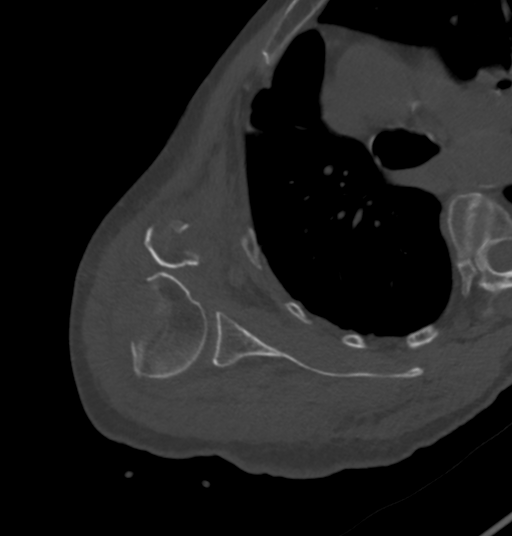
[im 60/87  soft-tissue]
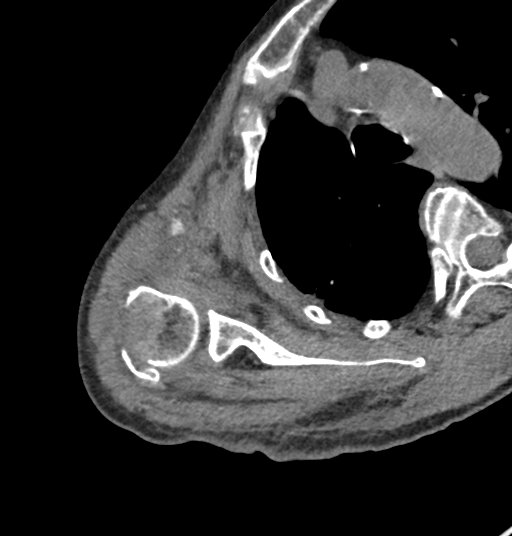
[im 60/87  bone]
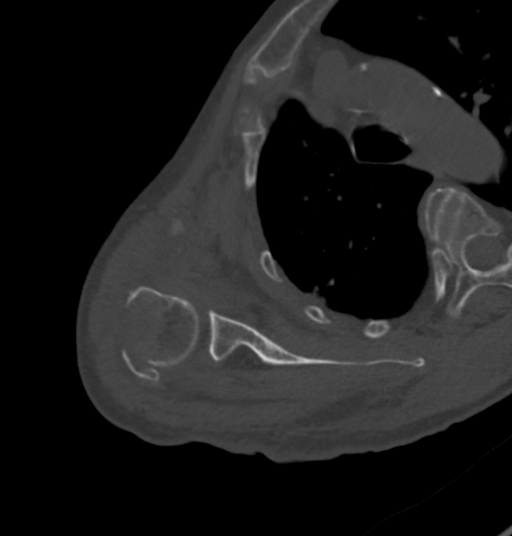
[im 67/87  bone]
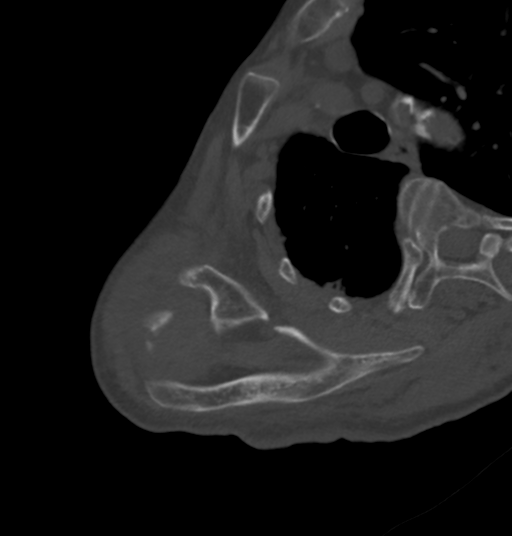
[im 73/87  bone]
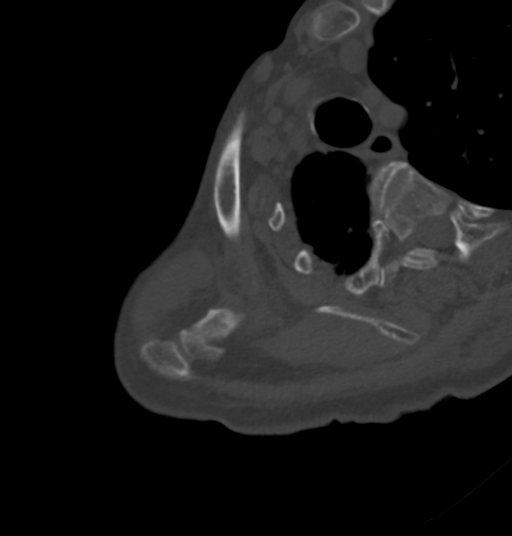
[im 80/87  bone]
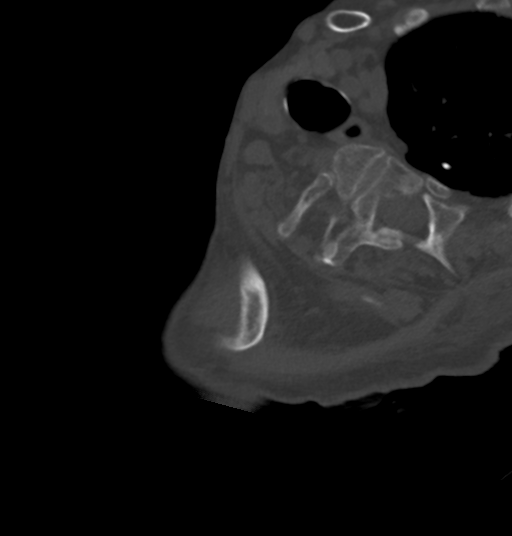

[Series 9: sag st · coronal · 0.41mm/px · 3 of 87 slices shown]
[im 18/87  bone]
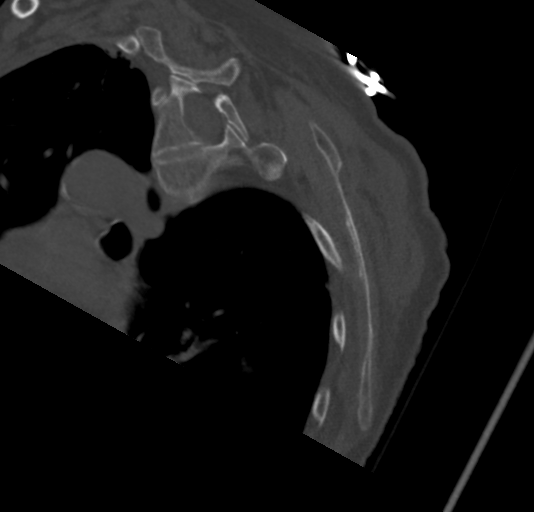
[im 35/87  bone]
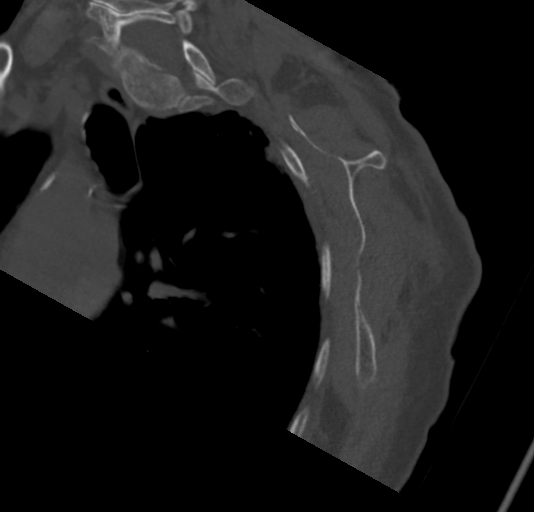
[im 52/87  bone]
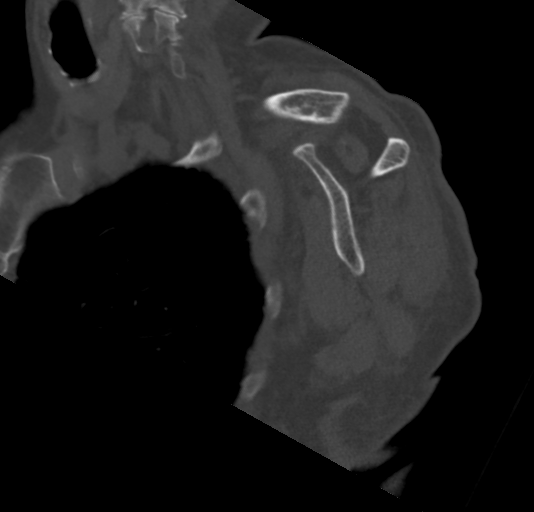

[15 of 20 positions shown; findings below may reference images not displayed]

FINDINGS: As seen on the comparison plain films, the patient has a fracture of
the surgical neck of the right humerus. The shaft of the humerus
demonstrates anterior displacement of approximately 4.2 cm. The
humeral head is rotated with the greater tuberosity directed toward
the superior margin of the glenoid. There is fragment override of
approximately 2 cm. The fracture extends through the greater
tuberosity most notable posteriorly where there is distraction of
approximately 0.7 cm and mild comminution. The lesser tuberosity is
spared. The humeral head remains located.

The acromioclavicular joint is intact and unremarkable. The rotator
cuff of and long head of biceps tendon appear intact.

The patient has a severe compression fracture deformity of T7.
Milder compression fracture of T8 is also identified. These
fractures are remote and seen on the comparison plain films of the
chest. Aortic atherosclerosis is identified. Imaged lung parenchyma
is clear.
IMPRESSION: Acute surgical neck fracture of the humerus with anterior
displacement of approximately 4.2 cm and fragment override of
approximately 2 cm. The fracture extends into the greater tuberosity
where there is mild distraction and comminution. As noted above, the
humeral head is rotated due to retraction by the rotator cuff with
the greater tuberosity directed toward the glenoid.

Remote T7 and T8 compression fractures.

Calcific aortic atherosclerosis.
